# Patient Record
Sex: Male | Born: 1935 | Race: White | Hispanic: No | Smoking: Former smoker
Health system: Southern US, Community
[De-identification: ages and names within clinical notes are randomized; demographics above are authoritative.]

## PROBLEM LIST (undated history)

## (undated) DIAGNOSIS — I4891 Unspecified atrial fibrillation: Secondary | ICD-10-CM

## (undated) DIAGNOSIS — R7302 Impaired glucose tolerance (oral): Secondary | ICD-10-CM

## (undated) DIAGNOSIS — K5730 Diverticulosis of large intestine without perforation or abscess without bleeding: Secondary | ICD-10-CM

## (undated) DIAGNOSIS — L719 Rosacea, unspecified: Secondary | ICD-10-CM

## (undated) DIAGNOSIS — N3281 Overactive bladder: Secondary | ICD-10-CM

## (undated) DIAGNOSIS — N400 Enlarged prostate without lower urinary tract symptoms: Secondary | ICD-10-CM

## (undated) DIAGNOSIS — Z9289 Personal history of other medical treatment: Secondary | ICD-10-CM

## (undated) DIAGNOSIS — L409 Psoriasis, unspecified: Secondary | ICD-10-CM

## (undated) DIAGNOSIS — I10 Essential (primary) hypertension: Secondary | ICD-10-CM

## (undated) DIAGNOSIS — N4340 Spermatocele of epididymis, unspecified: Secondary | ICD-10-CM

## (undated) HISTORY — PX: TONSILLECTOMY: SUR1361

## (undated) HISTORY — PX: HYDROCELE EXCISION / REPAIR: SUR1145

## (undated) HISTORY — PX: CATARACT EXTRACTION W/ INTRAOCULAR LENS  IMPLANT, BILATERAL: SHX1307

## (undated) HISTORY — PX: MELANOMA EXCISION: SHX5266

---

## 1999-01-26 ENCOUNTER — Ambulatory Visit (HOSPITAL_BASED_OUTPATIENT_CLINIC_OR_DEPARTMENT_OTHER): Admission: RE | Admit: 1999-01-26 | Discharge: 1999-01-26 | Payer: Self-pay | Admitting: Urology

## 1999-04-04 ENCOUNTER — Other Ambulatory Visit: Admission: RE | Admit: 1999-04-04 | Discharge: 1999-04-04 | Payer: Self-pay | Admitting: Urology

## 2000-03-07 ENCOUNTER — Encounter: Payer: Self-pay | Admitting: Urology

## 2000-03-11 ENCOUNTER — Ambulatory Visit (HOSPITAL_COMMUNITY): Admission: RE | Admit: 2000-03-11 | Discharge: 2000-03-11 | Payer: Self-pay | Admitting: Urology

## 2011-11-13 DIAGNOSIS — I4891 Unspecified atrial fibrillation: Secondary | ICD-10-CM | POA: Diagnosis not present

## 2011-11-13 DIAGNOSIS — Z7901 Long term (current) use of anticoagulants: Secondary | ICD-10-CM | POA: Diagnosis not present

## 2011-11-22 DIAGNOSIS — Z961 Presence of intraocular lens: Secondary | ICD-10-CM | POA: Diagnosis not present

## 2011-11-22 DIAGNOSIS — H2510 Age-related nuclear cataract, unspecified eye: Secondary | ICD-10-CM | POA: Diagnosis not present

## 2011-11-22 DIAGNOSIS — H0230 Blepharochalasis unspecified eye, unspecified eyelid: Secondary | ICD-10-CM | POA: Diagnosis not present

## 2011-11-22 DIAGNOSIS — H18519 Endothelial corneal dystrophy, unspecified eye: Secondary | ICD-10-CM | POA: Diagnosis not present

## 2011-12-02 ENCOUNTER — Other Ambulatory Visit: Payer: Self-pay | Admitting: Ophthalmology

## 2011-12-02 DIAGNOSIS — D2310 Other benign neoplasm of skin of unspecified eyelid, including canthus: Secondary | ICD-10-CM | POA: Diagnosis not present

## 2011-12-02 DIAGNOSIS — L821 Other seborrheic keratosis: Secondary | ICD-10-CM | POA: Diagnosis not present

## 2011-12-25 DIAGNOSIS — I4891 Unspecified atrial fibrillation: Secondary | ICD-10-CM | POA: Diagnosis not present

## 2011-12-25 DIAGNOSIS — Z7901 Long term (current) use of anticoagulants: Secondary | ICD-10-CM | POA: Diagnosis not present

## 2012-02-05 ENCOUNTER — Other Ambulatory Visit: Payer: Self-pay

## 2012-02-05 ENCOUNTER — Inpatient Hospital Stay (HOSPITAL_COMMUNITY)
Admission: AD | Admit: 2012-02-05 | Discharge: 2012-02-06 | DRG: 556 | Disposition: A | Discharge status: Home / Self Care | Payer: Medicare Other | Source: Ambulatory Visit | Attending: Internal Medicine | Admitting: Internal Medicine

## 2012-02-05 ENCOUNTER — Encounter (HOSPITAL_COMMUNITY): Payer: Self-pay | Admitting: *Deleted

## 2012-02-05 DIAGNOSIS — E861 Hypovolemia: Secondary | ICD-10-CM | POA: Diagnosis present

## 2012-02-05 DIAGNOSIS — E119 Type 2 diabetes mellitus without complications: Secondary | ICD-10-CM | POA: Diagnosis present

## 2012-02-05 DIAGNOSIS — T148XXA Other injury of unspecified body region, initial encounter: Secondary | ICD-10-CM | POA: Diagnosis not present

## 2012-02-05 DIAGNOSIS — Z823 Family history of stroke: Secondary | ICD-10-CM | POA: Diagnosis not present

## 2012-02-05 DIAGNOSIS — R6889 Other general symptoms and signs: Secondary | ICD-10-CM | POA: Diagnosis not present

## 2012-02-05 DIAGNOSIS — R55 Syncope and collapse: Secondary | ICD-10-CM | POA: Diagnosis present

## 2012-02-05 DIAGNOSIS — L408 Other psoriasis: Secondary | ICD-10-CM | POA: Diagnosis present

## 2012-02-05 DIAGNOSIS — D649 Anemia, unspecified: Secondary | ICD-10-CM | POA: Diagnosis present

## 2012-02-05 DIAGNOSIS — N400 Enlarged prostate without lower urinary tract symptoms: Secondary | ICD-10-CM | POA: Diagnosis present

## 2012-02-05 DIAGNOSIS — I959 Hypotension, unspecified: Secondary | ICD-10-CM | POA: Diagnosis present

## 2012-02-05 DIAGNOSIS — I10 Essential (primary) hypertension: Secondary | ICD-10-CM | POA: Diagnosis present

## 2012-02-05 DIAGNOSIS — M7981 Nontraumatic hematoma of soft tissue: Principal | ICD-10-CM | POA: Diagnosis present

## 2012-02-05 DIAGNOSIS — D72829 Elevated white blood cell count, unspecified: Secondary | ICD-10-CM | POA: Diagnosis present

## 2012-02-05 DIAGNOSIS — I4891 Unspecified atrial fibrillation: Secondary | ICD-10-CM | POA: Diagnosis present

## 2012-02-05 DIAGNOSIS — Z79899 Other long term (current) drug therapy: Secondary | ICD-10-CM | POA: Diagnosis not present

## 2012-02-05 DIAGNOSIS — IMO0002 Reserved for concepts with insufficient information to code with codable children: Secondary | ICD-10-CM

## 2012-02-05 DIAGNOSIS — T45515A Adverse effect of anticoagulants, initial encounter: Secondary | ICD-10-CM | POA: Diagnosis present

## 2012-02-05 DIAGNOSIS — Z7901 Long term (current) use of anticoagulants: Secondary | ICD-10-CM

## 2012-02-05 DIAGNOSIS — L409 Psoriasis, unspecified: Secondary | ICD-10-CM | POA: Diagnosis present

## 2012-02-05 HISTORY — DX: Essential (primary) hypertension: I10

## 2012-02-05 HISTORY — DX: Diverticulosis of large intestine without perforation or abscess without bleeding: K57.30

## 2012-02-05 HISTORY — DX: Overactive bladder: N32.81

## 2012-02-05 HISTORY — DX: Impaired glucose tolerance (oral): R73.02

## 2012-02-05 HISTORY — DX: Spermatocele of epididymis, unspecified: N43.40

## 2012-02-05 HISTORY — DX: Psoriasis, unspecified: L40.9

## 2012-02-05 HISTORY — DX: Benign prostatic hyperplasia without lower urinary tract symptoms: N40.0

## 2012-02-05 HISTORY — DX: Unspecified atrial fibrillation: I48.91

## 2012-02-05 HISTORY — DX: Rosacea, unspecified: L71.9

## 2012-02-05 MED ORDER — PNEUMOCOCCAL VAC POLYVALENT 25 MCG/0.5ML IJ INJ: 0.5000 mL | INJECTION | INTRAMUSCULAR | Status: DC

## 2012-02-05 MED ORDER — ALUM & MAG HYDROXIDE-SIMETH 200-200-20 MG/5ML PO SUSP: 30.0000 mL | Freq: Four times a day (QID) | ORAL | Status: DC | PRN

## 2012-02-05 MED ORDER — PANTOPRAZOLE SODIUM 40 MG PO TBEC
40.0000 mg | DELAYED_RELEASE_TABLET | Freq: Every day | ORAL | Status: DC
  Administered 2012-02-06: 40 mg via ORAL
  Filled 2012-02-05 (×3): qty 1

## 2012-02-05 MED ORDER — ACETAMINOPHEN 500 MG PO TABS: 500.0000 mg | ORAL_TABLET | Freq: Four times a day (QID) | ORAL | Status: DC | PRN

## 2012-02-05 MED ORDER — SODIUM CHLORIDE 0.9 % IJ SOLN: 3.0000 mL | Freq: Two times a day (BID) | INTRAMUSCULAR | Status: DC

## 2012-02-05 MED ORDER — HYDROCODONE-ACETAMINOPHEN 5-325 MG PO TABS: 1.0000 | ORAL_TABLET | ORAL | Status: DC | PRN

## 2012-02-05 MED ORDER — SENNOSIDES-DOCUSATE SODIUM 8.6-50 MG PO TABS
1.0000 | ORAL_TABLET | Freq: Every evening | ORAL | Status: DC | PRN
  Filled 2012-02-05: qty 1

## 2012-02-05 MED ORDER — ZOLPIDEM TARTRATE 5 MG PO TABS: 5.0000 mg | ORAL_TABLET | Freq: Every evening | ORAL | Status: DC | PRN

## 2012-02-05 MED ORDER — PHYTONADIONE 5 MG PO TABS
5.0000 mg | ORAL_TABLET | Freq: Once | ORAL | Status: AC
  Administered 2012-02-05: 5 mg via ORAL
  Filled 2012-02-05: qty 1

## 2012-02-05 MED ORDER — POTASSIUM CHLORIDE IN NACL 20-0.9 MEQ/L-% IV SOLN
INTRAVENOUS | Status: DC
  Administered 2012-02-05 – 2012-02-06 (×2): via INTRAVENOUS
  Filled 2012-02-05 (×4): qty 1000

## 2012-02-05 NOTE — H&P (Addendum)
William Ibarra is an 78 y.o. male.   Chief Complaint: near fainting today, extremely low blood pressure HPI:  The patient is an 78 year old Caucasian man with several medical problems who is seen in the office today for routine INR recheck.  His INR level was greater than 4.0 and he described to our pharmacist PhD a large hematoma on the posterior left buttock and left thigh that he noticed today. In addition to his INR and a CBC was checked which showed leukocytosis and anemia.  He also had marked lightheadedness with near syncope when walking.  His initial blood pressure in our office was 60/40. He has not had a recent fall, headache, vision change, shortness of breath, cough, abdominal pain, fever, chills, nausea, vomiting, melena, or dysuria or frequency.  Other than the marked lightheadedness, he is only troubled by some soreness in the posterior left thigh.  He has been on Coumadin for stroke prophylaxis with atrial fibrillation chronically without problems.  Past Medical History  Diagnosis Date  . Hypertension   . Atrial fibrillation   . Hypotension 02/05/2012  . BPH (benign prostatic hypertrophy)   . Diabetes mellitus   . Diverticulosis of colon     from a prior colonoscopy  . Hydrocele   . Psoriasis   . Rosacea   . Spermatocele   . Hydrocele   . Impaired glucose tolerance   . Overactive bladder     Medications Prior to Admission  Medication Dose Route Frequency Provider Last Rate Last Dose  . 0.9 % NaCl with KCl 20 mEq/ L  infusion   Intravenous Continuous Garlan Fillers, MD      . acetaminophen (TYLENOL) tablet 500 mg  500 mg Oral Q6H PRN Garlan Fillers, MD      . alum & mag hydroxide-simeth (MAALOX/MYLANTA) 200-200-20 MG/5ML suspension 30 mL  30 mL Oral Q6H PRN Garlan Fillers, MD      . HYDROcodone-acetaminophen [facility]) 5-325 MG per tablet 1-2 tablet  1-2 tablet Oral Q4H PRN Garlan Fillers, MD      . pantoprazole (PROTONIX) EC tablet 40 mg  40 mg Oral Q0600 Garlan Fillers, MD      . phytonadione (VITAMIN K) tablet 5 mg  5 mg Oral Once Garlan Fillers, MD      . senna-docusate (Senokot-S) tablet 1 tablet  1 tablet Oral QHS PRN Garlan Fillers, MD      . sodium chloride 0.9 % injection 3 mL  3 mL Intravenous Q12H Garlan Fillers, MD      . zolpidem [facility]) tablet 5 mg  5 mg Oral QHS PRN Garlan Fillers, MD      . DISCONTD: pneumococcal 23 valent vaccine (PNU-IMMUNE) injection 0.5 mL  0.5 mL Intramuscular Tomorrow-1000 Garlan Fillers, MD       Medications Prior to Admission  Medication Sig Dispense Refill  . atenolol (TENORMIN) 25 MG tablet Take 25 mg by mouth daily.      . candesartan-hydrochlorothiazide (ATACAND HCT) 16-12.5 MG per tablet Take 1 tablet by mouth daily.      Marland Kitchen warfarin (COUMADIN) 5 MG tablet Take 2.5-5 mg by mouth as directed. Skip 02/06/2012 and 02/07/2012 dose per MD and then resume coumadin regiment of 5 mg by mouth on Monday and Friday; 2.5 mg by mouth on remaining days of week        ADDITIONAL HOME MEDICATIONS: Coumadin 5 mg by mouth daily, atenolol 25 mg by mouth daily, Atacand  HCT 16/12.5 take 1 tab by mouth twice a day, aspirin 81 mg by mouth daily  PHYSICIANS INVOLVED IN CARE: Creola Corn (primary care)   PERTINENT IMMUNIZATION HISTORY:  Pneumovax vaccination in 2007, influenza vaccination on August 21, 2011  Past Surgical History  Procedure Date  . Hydrocele excision / repair   Remote prostate biopsy was negative, left cataract operation, colon polypectomy  History reviewed. No pertinent family history. His father died at age 41 years from stroke.  His mother died at age 38 years from leukemia.  There is no family history of diabetes mellitus or colon cancer  Social History:  reports that he quit smoking about 20 years ago. He has never used smokeless tobacco. He reports that he drinks alcohol. He reports that he does not use illicit drugs.  Allergies: No Known Allergies   ROS: anemia, ankle swelling,  arthritis, cataracts, diabetes, fainting of seizures, heart palpitation, high blood pressure and atrial fibrillation, benign prostatic hypertrophy, diabetes mellitus, type II, diverticulosis, overactive bladder, psoriasis, rosacea  PHYSICAL EXAM: Blood pressure 107/68, pulse 90, temperature 98.1 F (36.7 C), temperature source Oral, resp. rate 18, height 6\' 1"  (1.854 m), weight 113.717 kg (250 lb 11.2 oz), SpO2 97.00%. In general, the patient is a well-nourished well-developed Caucasian man who was in no apparent distress while sitting in a chair.  He did have some diaphoresis.  HEENT exam was within normal limits, neck was supple without jugular venous distention or carotid bruit, chest was clear to auscultation, heart had a slightly irregular rhythm without significant murmur or gallop, abdomen had normal bowel sounds and no hepatosplenomegaly or tenderness, he had left 1+ leg edema and right trace ankle edema.  He had a large hematoma on the posterior left thigh.  Neurologic exam: He was alert and well oriented with a normal affect, cranial nerves II through XII were normal, sensory exam was grossly normal, motor strength was 5 over 5 throughout, cerebellar function and gait were not assessed.  CBC results today in the office: White blood cell 17.8 with 82.8% granulocytes, hemoglobin 9.9, hematocrit 30.2, MCV 95.8, platelets 446  No results found for this or any previous visit (from the past 48 hour(s)). No results found.   Assessment/Plan #1 Near Syncope And Hypotension: His symptoms are most likely from a fairly significant spontaneous bleed into the left lower extremity with a resultant decrease in intravascular fluid volume. His symptoms are less likely from a transient arrhythmia.  We will admit him to a telemetry bed and administer moderate dose IV fluids.  We will hold his blood pressure medications and recheck his orthostatic vital signs tomorrow morning. #2 Anticoagulation: His INR is  supratherapeutic with spontaneous bleeding, so he will be treated with vitamin K by mouth to reverse his Coumadin somewhat.  We will cautiously restart his Coumadin with a target INR of 2.0-3.0. #3 Atrial Fibrillation: Stable on current medications.  Sharnise Blough G 02/05/2012, 6:00 PM

## 2012-02-05 NOTE — ED Notes (Signed)
..  02/05/2012 4:45 PM UPDATED information has been added to the PTA medication list.  Please re-address admission med rec navigator. Thank you,   Selinda Flavin .. Medications Prior to Admission  Medication Dose Route Frequency Provider Last Rate Last Dose  . pneumococcal 23 valent vaccine (PNU-IMMUNE) injection 0.5 mL  0.5 mL Intramuscular Tomorrow-1000 Garlan Fillers, MD       Medications Prior to Admission  Medication Sig Dispense Refill  . atenolol (TENORMIN) 25 MG tablet Take 25 mg by mouth daily.      . candesartan-hydrochlorothiazide (ATACAND HCT) 16-12.5 MG per tablet Take 1 tablet by mouth daily.      Marland Kitchen warfarin (COUMADIN) 5 MG tablet Take 2.5-5 mg by mouth as directed. Skip 02/06/2012 and 02/07/2012 dose per MD and then resume coumadin regiment of 5 mg by mouth on Monday and Friday; 2.5 mg by mouth on remaining days of week

## 2012-02-06 DIAGNOSIS — D649 Anemia, unspecified: Secondary | ICD-10-CM | POA: Diagnosis present

## 2012-02-06 LAB — CBC
HCT: 21.5 % — ABNORMAL LOW (ref 39.0–52.0)
HCT: 27.6 % — ABNORMAL LOW (ref 39.0–52.0)
Hemoglobin: 7 g/dL — ABNORMAL LOW (ref 13.0–17.0)
Hemoglobin: 9.2 g/dL — ABNORMAL LOW (ref 13.0–17.0)
MCH: 30.4 pg (ref 26.0–34.0)
MCH: 30.5 pg (ref 26.0–34.0)
MCHC: 32.6 g/dL (ref 30.0–36.0)
MCHC: 33.3 g/dL (ref 30.0–36.0)
MCV: 93.5 fL (ref 78.0–100.0)
MCV: 91.4 fL (ref 78.0–100.0)
Platelets: 222 10*3/uL (ref 150–400)
Platelets: 219 10*3/uL (ref 150–400)
RBC: 2.3 MIL/uL — ABNORMAL LOW (ref 4.22–5.81)
RBC: 3.02 MIL/uL — ABNORMAL LOW (ref 4.22–5.81)
RDW: 15.4 % (ref 11.5–15.5)
RDW: 16.7 % — ABNORMAL HIGH (ref 11.5–15.5)
WBC: 8.8 10*3/uL (ref 4.0–10.5)
WBC: 11.7 10*3/uL — ABNORMAL HIGH (ref 4.0–10.5)

## 2012-02-06 LAB — COMPREHENSIVE METABOLIC PANEL
ALT: 6 U/L (ref 0–53)
AST: 10 U/L (ref 0–37)
Albumin: 2.8 g/dL — ABNORMAL LOW (ref 3.5–5.2)
Alkaline Phosphatase: 31 U/L — ABNORMAL LOW (ref 39–117)
BUN: 28 mg/dL — ABNORMAL HIGH (ref 6–23)
CO2: 29 mEq/L (ref 19–32)
Calcium: 8.4 mg/dL (ref 8.4–10.5)
Chloride: 105 mEq/L (ref 96–112)
Creatinine, Ser: 1.08 mg/dL (ref 0.50–1.35)
GFR calc Af Amer: 70 mL/min — ABNORMAL LOW (ref >90)
GFR calc non Af Amer: 61 mL/min — ABNORMAL LOW (ref >90)
Glucose, Bld: 138 mg/dL — ABNORMAL HIGH (ref 70–99)
Potassium: 3.2 mEq/L — ABNORMAL LOW (ref 3.5–5.1)
Sodium: 140 mEq/L (ref 135–145)
Total Bilirubin: 1 mg/dL (ref 0.3–1.2)
Total Protein: 4.8 g/dL — ABNORMAL LOW (ref 6.0–8.3)

## 2012-02-06 LAB — ABO/RH: ABO/RH(D): A POS

## 2012-02-06 LAB — PREPARE RBC (CROSSMATCH)

## 2012-02-06 LAB — PROTIME-INR
INR: 2.49 — ABNORMAL HIGH (ref 0.00–1.49)
Prothrombin Time: 27.3 seconds — ABNORMAL HIGH (ref 11.6–15.2)

## 2012-02-06 MED ORDER — FUROSEMIDE 10 MG/ML IJ SOLN
20.0000 mg | Freq: Once | INTRAMUSCULAR | Status: AC
  Administered 2012-02-06: 20 mg via INTRAVENOUS
  Filled 2012-02-06: qty 2

## 2012-02-06 MED ORDER — ACETAMINOPHEN 325 MG PO TABS
650.0000 mg | ORAL_TABLET | Freq: Once | ORAL | Status: AC
  Administered 2012-02-06: 650 mg via ORAL
  Filled 2012-02-06: qty 2

## 2012-02-06 NOTE — Discharge Summary (Addendum)
Physician Discharge Summary  Patient ID: William Ibarra MRN: [id number] DOB/AGE: 76-Jul-1925 76 y.o.  Admit date: 02/05/2012 Discharge date: 02/06/2012   Discharge Diagnoses:  Principal Problem:  *Hypotension Active Problems:  Anticoagulation excessive  Anemia  Diabetes mellitus type 2, controlled  Atrial fibrillation  Psoriasis   Discharged Condition: good  Hospital Course: the patient is an 78 year old Caucasian man with several medical problems who was seen in the office on February 05, 2012 for a routine INR check on Coumadin for atrial fibrillation.  The INR level was greater than 4.0, and he pointed out to the pharmacist PhD a spontaneous large hematoma that formed on the left by doc and posterior left thigh, so she checked a CBC that showed leukocytosis with moderate anemia.  We also checked a blood pressure that was 60/40 associated with lightheadedness.  He denied having a recent fall, headache, vision change, shortness of breath, chest pain, abdominal pain, nausea, vomiting, melena, dysuria, or frequency.  He did complain of some soreness in the posterior left thigh.  He was admitted to the hospital for further evaluation of symptomatic hypotension in the face of a supratherapeutic INR level.  In the hospital he was treated with moderate dose IV fluids, vitamin K 5 mg by mouth, and medications for hypertension were held.  Earlier today his repeat hematocrit showed a very significant drop with hydration, so he was given a transfusion of 2 units of packed red blood cells.  With this his hematocrit increased appropriately to more acceptable range.  He also has had normal blood pressures today and has been able to ambulate without assistance.  He did receive Lasix 20 mg IV following the transfusion and after the transfusion he denied any shortness of breath or chest discomfort or nausea or vomiting.  He has been eating well with normal bowel movements.  Procedures: Transfusion of 2 units of  packed red blood cells.  Complications: None.  The plan will be for him to again hold his Coumadin dose tonight and restart Coumadin at 5 mg twice weekly and 2.5 mg on other days.  He will also have an office visit with [place] early next week at which time we can recheck his INR level.  Of note, his aspirin is being held for now.  Consults: None  Significant Diagnostic Studies:  No results found.  Labs: Lab Results  Component Value Date   WBC 11.7* 02/06/2012   HGB 9.2* 02/06/2012   HCT 27.6* 02/06/2012   MCV 91.4 02/06/2012   PLT 219 02/06/2012  Above represents CBC following transfusion of 2 units of PRBCs   Lab 02/06/12 0508  NA 140  K 3.2*  CL 105  CO2 29  BUN 28*  CREATININE 1.08  CALCIUM 8.4  PROT 4.8*  BILITOT 1.0  ALKPHOS 31*  ALT 6  AST 10  GLUCOSE 138*       Lab Results  Component Value Date   INR 2.49* 02/06/2012     No results found for this or any previous visit (from the past 240 hour(s)).    Discharge Exam: Blood pressure 123/79, pulse 103, temperature 98.3 F (36.8 C), temperature source Oral, resp. rate 18, height 6\' 1"  (1.854 m), weight 113.717 kg (250 lb 11.2 oz), SpO2 98.00%.  Physical Exam: In general, he is a well-nourished well-developed white man who was in no apparent distress pulse in upright in bed.  HEENT exam was within normal limits, neck was supple without jugular venous distention or carotid bruit,  chest was clear to auscultation, heart had a slightly irregular rhythm without significant murmur or gallop, abdomen had normal bowel sounds, he had 2+ left leg edema and 1+ right leg edema.  Neurologic exam: He was alert and well oriented with normal affect and able to move all extremities well.  Skin exam: there is a very large hematoma along the left buttock and posterior aspect of the left thigh.  Disposition:he'll be discharged to home in the company of his daughter and will [facility]  He  should use his cane for now to prevent falls.  He should restart his Coumadin as described below.  He should also schedule a followup visit with Dr. Timothy Lasso at [facility] either Tuesday or Wednesday of next week.  Discharge Orders    Future Orders Please Complete By Expires   Diet - low sodium heart healthy      Increase activity slowly      Discharge instructions      Comments:   He will be discharged to home.  He can use Tylenol as needed for pain.  He should closely follow new directions for Coumadin dosing with no Coumadin on April 4 and April 5.  He should call us if his symptoms worsen such as worsening lightheadedness, shortness of breath, abnormal bleeding, nausea, vomiting, fever, or chills.  He should have a followup office visit and INR recheck either Tuesday or Wednesday of this coming week.  He should call [phone] to schedule that visit.     Medication List  As of 02/06/2012  6:50 PM   STOP taking these medications         aspirin EC 81 MG tablet         TAKE these medications         acetaminophen 500 MG tablet   Commonly known as: TYLENOL   Take 500 mg by mouth every 6 (six) hours as needed.      atenolol 25 MG tablet   Commonly known as: TENORMIN   Take 25 mg by mouth daily.      candesartan-hydrochlorothiazide 16-12.5 MG per tablet   Commonly known as: ATACAND HCT   Take 1 tablet by mouth daily.      warfarin 5 MG tablet   Commonly known as: COUMADIN   Take 2.5-5 mg by mouth as directed. Skip 02/06/2012 and 02/07/2012 dose per MD and then resume coumadin regiment of 5 mg by mouth on Monday and Friday; 2.5 mg by mouth on remaining days of week             Signed: Jaycub Noorani G 02/06/2012, 6:50 PM

## 2012-02-06 NOTE — Progress Notes (Signed)
Hgb 7 this AM. No evidence of further bleeding. BP stable. Left leg hematoma appears unchanged. Dr. Jarold Motto made aware.

## 2012-02-06 NOTE — Progress Notes (Signed)
CSW received call from [facility] indicating this pt is from their [employer]. If pt requires a higher level of care csw is available to offer assistance with d/c planning.  Cori Razor  LCSW  [phone]

## 2012-02-06 NOTE — Progress Notes (Signed)
Subjective: He feels OK and denies shortness of breath, chest pain, abdominal pain, or nausea. He had a BM today which was normal. He would very much like to return home ASAP as his wife needs his assistance. We discussed the risks and benefits of blood transfusion and he agrees to have this done today.  Objective: Vital signs in last 24 hours: Temp:  [98 F (36.7 C)-98.5 F (36.9 C)] 98.5 F (36.9 C) (04/04 0406) Pulse Rate:  [68-99] 93  (04/04 0406) Resp:  [18-19] 18  (04/04 0406) BP: (101-116)/(65-72) 109/65 mmHg (04/04 0406) SpO2:  [96 %-98 %] 98 % (04/04 0406) Weight:  [113.717 kg (250 lb 11.2 oz)-250.7 kg (552 lb 11.1 oz)] 113.717 kg (250 lb 11.2 oz) (04/03 1638) Weight change:    Intake/Output from previous day:     General appearance: alert, cooperative and no distress Resp: clear to auscultation bilaterally Cardio: irregularly irregular rhythm GI: soft, non-tender; bowel sounds normal; no masses,  no organomegaly Extremities: 2+ left leg edema Skin: very large hematoma left buttock and posterior left thigh  Lab Results:  Basename 02/06/12 0508  WBC 8.8  HGB 7.0*  HCT 21.5*  PLT 222   BMET  Basename 02/06/12 0508  NA 140  K 3.2*  CL 105  CO2 29  GLUCOSE 138*  BUN 28*  CREATININE 1.08  CALCIUM 8.4   CMET CMP     Component Value Date/Time   NA 140 02/06/2012 0508   K 3.2* 02/06/2012 0508   CL 105 02/06/2012 0508   CO2 29 02/06/2012 0508   GLUCOSE 138* 02/06/2012 0508   BUN 28* 02/06/2012 0508   CREATININE 1.08 02/06/2012 0508   CALCIUM 8.4 02/06/2012 0508   PROT 4.8* 02/06/2012 0508   ALBUMIN 2.8* 02/06/2012 0508   AST 10 02/06/2012 0508   ALT 6 02/06/2012 0508   ALKPHOS 31* 02/06/2012 0508   BILITOT 1.0 02/06/2012 0508   GFRNONAA 61* 02/06/2012 0508   GFRAA 70* 02/06/2012 0508    CBG (last 3)  No results found for this basename: GLUCAP:3 in the last 72 hours  INR RESULTS:   Lab Results  Component Value Date   INR 2.49* 02/06/2012     Studies/Results: No results  found.  Medications: I have reviewed the patient's current medications.  Assessment/Plan: #1 Thigh Hematoma and Anemia:  Interval large drop in Hemoglobin likely due to normalization of intravascular fluid volume with IVF. He does not appear to have continued bleeding and his INR is now in a therapeutic range. We will transfuse 2 units of PRBC today, and discharge to home either later today or tomorrow morning. #2 Supratherapeutic INR:  Improved with holding coumadin and administration of vitamin K. #3 Hypotension:  Improved and we will have him ambulate on the ward today with standby assistance.  LOS: 1 day   Grethel Zenk G 02/06/2012, 7:59 AM

## 2012-02-07 LAB — TYPE AND SCREEN
ABO/RH(D): A POS
Antibody Screen: NEGATIVE
Unit division: 0
Unit division: 0

## 2012-02-12 DIAGNOSIS — I4891 Unspecified atrial fibrillation: Secondary | ICD-10-CM | POA: Diagnosis not present

## 2012-02-12 DIAGNOSIS — T148XXA Other injury of unspecified body region, initial encounter: Secondary | ICD-10-CM | POA: Diagnosis not present

## 2012-02-12 DIAGNOSIS — Z7901 Long term (current) use of anticoagulants: Secondary | ICD-10-CM | POA: Diagnosis not present

## 2012-02-12 DIAGNOSIS — E119 Type 2 diabetes mellitus without complications: Secondary | ICD-10-CM | POA: Diagnosis not present

## 2012-02-19 DIAGNOSIS — I4891 Unspecified atrial fibrillation: Secondary | ICD-10-CM | POA: Diagnosis not present

## 2012-02-19 DIAGNOSIS — Z7901 Long term (current) use of anticoagulants: Secondary | ICD-10-CM | POA: Diagnosis not present

## 2012-02-27 DIAGNOSIS — I4891 Unspecified atrial fibrillation: Secondary | ICD-10-CM | POA: Diagnosis not present

## 2012-02-27 DIAGNOSIS — Z7901 Long term (current) use of anticoagulants: Secondary | ICD-10-CM | POA: Diagnosis not present

## 2012-03-26 DIAGNOSIS — I4891 Unspecified atrial fibrillation: Secondary | ICD-10-CM | POA: Diagnosis not present

## 2012-03-26 DIAGNOSIS — Z7901 Long term (current) use of anticoagulants: Secondary | ICD-10-CM | POA: Diagnosis not present

## 2012-04-15 DIAGNOSIS — Z7901 Long term (current) use of anticoagulants: Secondary | ICD-10-CM | POA: Diagnosis not present

## 2012-04-15 DIAGNOSIS — I4891 Unspecified atrial fibrillation: Secondary | ICD-10-CM | POA: Diagnosis not present

## 2012-04-29 DIAGNOSIS — Z7901 Long term (current) use of anticoagulants: Secondary | ICD-10-CM | POA: Diagnosis not present

## 2012-04-29 DIAGNOSIS — I4891 Unspecified atrial fibrillation: Secondary | ICD-10-CM | POA: Diagnosis not present

## 2012-05-27 DIAGNOSIS — Z7901 Long term (current) use of anticoagulants: Secondary | ICD-10-CM | POA: Diagnosis not present

## 2012-05-27 DIAGNOSIS — E119 Type 2 diabetes mellitus without complications: Secondary | ICD-10-CM | POA: Diagnosis not present

## 2012-05-27 DIAGNOSIS — I10 Essential (primary) hypertension: Secondary | ICD-10-CM | POA: Diagnosis not present

## 2012-05-27 DIAGNOSIS — I4891 Unspecified atrial fibrillation: Secondary | ICD-10-CM | POA: Diagnosis not present

## 2012-06-09 ENCOUNTER — Other Ambulatory Visit: Payer: Self-pay | Admitting: Dermatology

## 2012-06-09 DIAGNOSIS — L570 Actinic keratosis: Secondary | ICD-10-CM | POA: Diagnosis not present

## 2012-06-09 DIAGNOSIS — C44221 Squamous cell carcinoma of skin of unspecified ear and external auricular canal: Secondary | ICD-10-CM | POA: Diagnosis not present

## 2012-06-09 DIAGNOSIS — C44320 Squamous cell carcinoma of skin of unspecified parts of face: Secondary | ICD-10-CM | POA: Diagnosis not present

## 2012-06-09 DIAGNOSIS — D485 Neoplasm of uncertain behavior of skin: Secondary | ICD-10-CM | POA: Diagnosis not present

## 2012-07-01 DIAGNOSIS — I4891 Unspecified atrial fibrillation: Secondary | ICD-10-CM | POA: Diagnosis not present

## 2012-07-01 DIAGNOSIS — Z7901 Long term (current) use of anticoagulants: Secondary | ICD-10-CM | POA: Diagnosis not present

## 2012-07-15 DIAGNOSIS — C44320 Squamous cell carcinoma of skin of unspecified parts of face: Secondary | ICD-10-CM | POA: Diagnosis not present

## 2012-08-12 DIAGNOSIS — I10 Essential (primary) hypertension: Secondary | ICD-10-CM | POA: Diagnosis not present

## 2012-08-12 DIAGNOSIS — Z125 Encounter for screening for malignant neoplasm of prostate: Secondary | ICD-10-CM | POA: Diagnosis not present

## 2012-08-12 DIAGNOSIS — I4891 Unspecified atrial fibrillation: Secondary | ICD-10-CM | POA: Diagnosis not present

## 2012-08-12 DIAGNOSIS — Z23 Encounter for immunization: Secondary | ICD-10-CM | POA: Diagnosis not present

## 2012-08-12 DIAGNOSIS — R7301 Impaired fasting glucose: Secondary | ICD-10-CM | POA: Diagnosis not present

## 2012-08-12 DIAGNOSIS — Z7901 Long term (current) use of anticoagulants: Secondary | ICD-10-CM | POA: Diagnosis not present

## 2012-08-18 DIAGNOSIS — Z1212 Encounter for screening for malignant neoplasm of rectum: Secondary | ICD-10-CM | POA: Diagnosis not present

## 2012-08-18 DIAGNOSIS — N401 Enlarged prostate with lower urinary tract symptoms: Secondary | ICD-10-CM | POA: Diagnosis not present

## 2012-08-18 DIAGNOSIS — E876 Hypokalemia: Secondary | ICD-10-CM | POA: Diagnosis not present

## 2012-08-18 DIAGNOSIS — I10 Essential (primary) hypertension: Secondary | ICD-10-CM | POA: Diagnosis not present

## 2012-08-18 DIAGNOSIS — Z0000 Encounter for general adult medical examination without abnormal findings: Secondary | ICD-10-CM | POA: Diagnosis not present

## 2012-08-18 DIAGNOSIS — I4891 Unspecified atrial fibrillation: Secondary | ICD-10-CM | POA: Diagnosis not present

## 2012-09-23 DIAGNOSIS — I4891 Unspecified atrial fibrillation: Secondary | ICD-10-CM | POA: Diagnosis not present

## 2012-09-23 DIAGNOSIS — Z7901 Long term (current) use of anticoagulants: Secondary | ICD-10-CM | POA: Diagnosis not present

## 2012-09-23 DIAGNOSIS — E119 Type 2 diabetes mellitus without complications: Secondary | ICD-10-CM | POA: Diagnosis not present

## 2012-11-05 DIAGNOSIS — R634 Abnormal weight loss: Secondary | ICD-10-CM | POA: Diagnosis not present

## 2012-11-05 DIAGNOSIS — Z7901 Long term (current) use of anticoagulants: Secondary | ICD-10-CM | POA: Diagnosis not present

## 2012-11-05 DIAGNOSIS — I4891 Unspecified atrial fibrillation: Secondary | ICD-10-CM | POA: Diagnosis not present

## 2012-11-05 DIAGNOSIS — Z79899 Other long term (current) drug therapy: Secondary | ICD-10-CM | POA: Diagnosis not present

## 2012-11-25 DIAGNOSIS — Z961 Presence of intraocular lens: Secondary | ICD-10-CM | POA: Diagnosis not present

## 2012-11-25 DIAGNOSIS — H2510 Age-related nuclear cataract, unspecified eye: Secondary | ICD-10-CM | POA: Diagnosis not present

## 2012-11-25 DIAGNOSIS — H18519 Endothelial corneal dystrophy, unspecified eye: Secondary | ICD-10-CM | POA: Diagnosis not present

## 2012-11-26 DIAGNOSIS — Z7901 Long term (current) use of anticoagulants: Secondary | ICD-10-CM | POA: Diagnosis not present

## 2012-11-26 DIAGNOSIS — E876 Hypokalemia: Secondary | ICD-10-CM | POA: Diagnosis not present

## 2012-11-26 DIAGNOSIS — I4891 Unspecified atrial fibrillation: Secondary | ICD-10-CM | POA: Diagnosis not present

## 2012-12-31 DIAGNOSIS — Z7901 Long term (current) use of anticoagulants: Secondary | ICD-10-CM | POA: Diagnosis not present

## 2012-12-31 DIAGNOSIS — I4891 Unspecified atrial fibrillation: Secondary | ICD-10-CM | POA: Diagnosis not present

## 2013-01-21 DIAGNOSIS — I4891 Unspecified atrial fibrillation: Secondary | ICD-10-CM | POA: Diagnosis not present

## 2013-01-21 DIAGNOSIS — L570 Actinic keratosis: Secondary | ICD-10-CM | POA: Diagnosis not present

## 2013-01-21 DIAGNOSIS — I10 Essential (primary) hypertension: Secondary | ICD-10-CM | POA: Diagnosis not present

## 2013-01-21 DIAGNOSIS — Z85828 Personal history of other malignant neoplasm of skin: Secondary | ICD-10-CM | POA: Diagnosis not present

## 2013-01-21 DIAGNOSIS — L820 Inflamed seborrheic keratosis: Secondary | ICD-10-CM | POA: Diagnosis not present

## 2013-01-21 DIAGNOSIS — Z7901 Long term (current) use of anticoagulants: Secondary | ICD-10-CM | POA: Diagnosis not present

## 2013-02-02 DIAGNOSIS — I4891 Unspecified atrial fibrillation: Secondary | ICD-10-CM | POA: Diagnosis not present

## 2013-02-02 DIAGNOSIS — E119 Type 2 diabetes mellitus without complications: Secondary | ICD-10-CM | POA: Diagnosis not present

## 2013-02-02 DIAGNOSIS — Z7901 Long term (current) use of anticoagulants: Secondary | ICD-10-CM | POA: Diagnosis not present

## 2013-02-02 DIAGNOSIS — I10 Essential (primary) hypertension: Secondary | ICD-10-CM | POA: Diagnosis not present

## 2013-03-03 DIAGNOSIS — Z7901 Long term (current) use of anticoagulants: Secondary | ICD-10-CM | POA: Diagnosis not present

## 2013-03-03 DIAGNOSIS — I4891 Unspecified atrial fibrillation: Secondary | ICD-10-CM | POA: Diagnosis not present

## 2013-04-06 DIAGNOSIS — Z7901 Long term (current) use of anticoagulants: Secondary | ICD-10-CM | POA: Diagnosis not present

## 2013-04-06 DIAGNOSIS — I4891 Unspecified atrial fibrillation: Secondary | ICD-10-CM | POA: Diagnosis not present

## 2013-05-12 DIAGNOSIS — I4891 Unspecified atrial fibrillation: Secondary | ICD-10-CM | POA: Diagnosis not present

## 2013-05-12 DIAGNOSIS — Z7901 Long term (current) use of anticoagulants: Secondary | ICD-10-CM | POA: Diagnosis not present

## 2013-06-23 DIAGNOSIS — Z7901 Long term (current) use of anticoagulants: Secondary | ICD-10-CM | POA: Diagnosis not present

## 2013-06-23 DIAGNOSIS — I4891 Unspecified atrial fibrillation: Secondary | ICD-10-CM | POA: Diagnosis not present

## 2013-07-09 DIAGNOSIS — Z23 Encounter for immunization: Secondary | ICD-10-CM | POA: Diagnosis not present

## 2013-07-28 ENCOUNTER — Other Ambulatory Visit: Payer: Self-pay | Admitting: Dermatology

## 2013-07-28 DIAGNOSIS — C44621 Squamous cell carcinoma of skin of unspecified upper limb, including shoulder: Secondary | ICD-10-CM | POA: Diagnosis not present

## 2013-07-28 DIAGNOSIS — L570 Actinic keratosis: Secondary | ICD-10-CM | POA: Diagnosis not present

## 2013-07-28 DIAGNOSIS — C44320 Squamous cell carcinoma of skin of unspecified parts of face: Secondary | ICD-10-CM | POA: Diagnosis not present

## 2013-07-28 DIAGNOSIS — D485 Neoplasm of uncertain behavior of skin: Secondary | ICD-10-CM | POA: Diagnosis not present

## 2013-07-28 DIAGNOSIS — L820 Inflamed seborrheic keratosis: Secondary | ICD-10-CM | POA: Diagnosis not present

## 2013-07-28 DIAGNOSIS — L408 Other psoriasis: Secondary | ICD-10-CM | POA: Diagnosis not present

## 2013-07-28 DIAGNOSIS — L821 Other seborrheic keratosis: Secondary | ICD-10-CM | POA: Diagnosis not present

## 2013-07-28 DIAGNOSIS — Z85828 Personal history of other malignant neoplasm of skin: Secondary | ICD-10-CM | POA: Diagnosis not present

## 2013-08-04 DIAGNOSIS — I4891 Unspecified atrial fibrillation: Secondary | ICD-10-CM | POA: Diagnosis not present

## 2013-08-04 DIAGNOSIS — Z7901 Long term (current) use of anticoagulants: Secondary | ICD-10-CM | POA: Diagnosis not present

## 2013-08-18 DIAGNOSIS — I10 Essential (primary) hypertension: Secondary | ICD-10-CM | POA: Diagnosis not present

## 2013-08-18 DIAGNOSIS — Z125 Encounter for screening for malignant neoplasm of prostate: Secondary | ICD-10-CM | POA: Diagnosis not present

## 2013-08-18 DIAGNOSIS — E119 Type 2 diabetes mellitus without complications: Secondary | ICD-10-CM | POA: Diagnosis not present

## 2013-08-24 DIAGNOSIS — L408 Other psoriasis: Secondary | ICD-10-CM | POA: Diagnosis not present

## 2013-08-24 DIAGNOSIS — Z1331 Encounter for screening for depression: Secondary | ICD-10-CM | POA: Diagnosis not present

## 2013-08-24 DIAGNOSIS — R05 Cough: Secondary | ICD-10-CM | POA: Diagnosis not present

## 2013-08-24 DIAGNOSIS — R269 Unspecified abnormalities of gait and mobility: Secondary | ICD-10-CM | POA: Diagnosis not present

## 2013-08-24 DIAGNOSIS — I4891 Unspecified atrial fibrillation: Secondary | ICD-10-CM | POA: Diagnosis not present

## 2013-08-24 DIAGNOSIS — Z7901 Long term (current) use of anticoagulants: Secondary | ICD-10-CM | POA: Diagnosis not present

## 2013-08-24 DIAGNOSIS — R059 Cough, unspecified: Secondary | ICD-10-CM | POA: Diagnosis not present

## 2013-08-24 DIAGNOSIS — E119 Type 2 diabetes mellitus without complications: Secondary | ICD-10-CM | POA: Diagnosis not present

## 2013-08-24 DIAGNOSIS — I10 Essential (primary) hypertension: Secondary | ICD-10-CM | POA: Diagnosis not present

## 2013-08-24 DIAGNOSIS — Z0000 Encounter for general adult medical examination without abnormal findings: Secondary | ICD-10-CM | POA: Diagnosis not present

## 2013-09-16 DIAGNOSIS — I4891 Unspecified atrial fibrillation: Secondary | ICD-10-CM | POA: Diagnosis not present

## 2013-09-16 DIAGNOSIS — Z7901 Long term (current) use of anticoagulants: Secondary | ICD-10-CM | POA: Diagnosis not present

## 2013-10-07 DIAGNOSIS — Z7901 Long term (current) use of anticoagulants: Secondary | ICD-10-CM | POA: Diagnosis not present

## 2013-10-07 DIAGNOSIS — I4891 Unspecified atrial fibrillation: Secondary | ICD-10-CM | POA: Diagnosis not present

## 2013-10-07 DIAGNOSIS — Z1212 Encounter for screening for malignant neoplasm of rectum: Secondary | ICD-10-CM | POA: Diagnosis not present

## 2013-11-10 DIAGNOSIS — I4891 Unspecified atrial fibrillation: Secondary | ICD-10-CM | POA: Diagnosis not present

## 2013-11-10 DIAGNOSIS — Z7901 Long term (current) use of anticoagulants: Secondary | ICD-10-CM | POA: Diagnosis not present

## 2013-12-01 DIAGNOSIS — H18519 Endothelial corneal dystrophy, unspecified eye: Secondary | ICD-10-CM | POA: Diagnosis not present

## 2013-12-01 DIAGNOSIS — Z961 Presence of intraocular lens: Secondary | ICD-10-CM | POA: Diagnosis not present

## 2013-12-01 DIAGNOSIS — H02839 Dermatochalasis of unspecified eye, unspecified eyelid: Secondary | ICD-10-CM | POA: Diagnosis not present

## 2013-12-01 DIAGNOSIS — H01029 Squamous blepharitis unspecified eye, unspecified eyelid: Secondary | ICD-10-CM | POA: Diagnosis not present

## 2013-12-01 DIAGNOSIS — H43819 Vitreous degeneration, unspecified eye: Secondary | ICD-10-CM | POA: Diagnosis not present

## 2013-12-01 DIAGNOSIS — H2510 Age-related nuclear cataract, unspecified eye: Secondary | ICD-10-CM | POA: Diagnosis not present

## 2013-12-14 DIAGNOSIS — Z7901 Long term (current) use of anticoagulants: Secondary | ICD-10-CM | POA: Diagnosis not present

## 2013-12-14 DIAGNOSIS — I4891 Unspecified atrial fibrillation: Secondary | ICD-10-CM | POA: Diagnosis not present

## 2013-12-20 DIAGNOSIS — H52209 Unspecified astigmatism, unspecified eye: Secondary | ICD-10-CM | POA: Diagnosis not present

## 2013-12-20 DIAGNOSIS — H2510 Age-related nuclear cataract, unspecified eye: Secondary | ICD-10-CM | POA: Diagnosis not present

## 2013-12-21 DIAGNOSIS — Z961 Presence of intraocular lens: Secondary | ICD-10-CM | POA: Diagnosis not present

## 2013-12-21 DIAGNOSIS — H4300 Vitreous prolapse, unspecified eye: Secondary | ICD-10-CM | POA: Diagnosis not present

## 2013-12-21 DIAGNOSIS — T8529XA Other mechanical complication of intraocular lens, initial encounter: Secondary | ICD-10-CM | POA: Diagnosis not present

## 2013-12-22 DIAGNOSIS — Y839 Surgical procedure, unspecified as the cause of abnormal reaction of the patient, or of later complication, without mention of misadventure at the time of the procedure: Secondary | ICD-10-CM | POA: Diagnosis not present

## 2013-12-22 DIAGNOSIS — T8529XA Other mechanical complication of intraocular lens, initial encounter: Secondary | ICD-10-CM | POA: Diagnosis not present

## 2013-12-22 DIAGNOSIS — H2700 Aphakia, unspecified eye: Secondary | ICD-10-CM | POA: Diagnosis not present

## 2013-12-22 DIAGNOSIS — Z961 Presence of intraocular lens: Secondary | ICD-10-CM | POA: Diagnosis not present

## 2013-12-22 DIAGNOSIS — H4300 Vitreous prolapse, unspecified eye: Secondary | ICD-10-CM | POA: Diagnosis not present

## 2013-12-22 DIAGNOSIS — Z9849 Cataract extraction status, unspecified eye: Secondary | ICD-10-CM | POA: Diagnosis not present

## 2013-12-22 DIAGNOSIS — H1820 Unspecified corneal edema: Secondary | ICD-10-CM | POA: Diagnosis not present

## 2013-12-22 DIAGNOSIS — H5460 Unqualified visual loss, one eye, unspecified: Secondary | ICD-10-CM | POA: Diagnosis not present

## 2014-01-13 DIAGNOSIS — I4891 Unspecified atrial fibrillation: Secondary | ICD-10-CM | POA: Diagnosis not present

## 2014-01-13 DIAGNOSIS — Z7901 Long term (current) use of anticoagulants: Secondary | ICD-10-CM | POA: Diagnosis not present

## 2014-01-26 ENCOUNTER — Other Ambulatory Visit: Payer: Self-pay | Admitting: Dermatology

## 2014-01-26 DIAGNOSIS — C44519 Basal cell carcinoma of skin of other part of trunk: Secondary | ICD-10-CM | POA: Diagnosis not present

## 2014-01-26 DIAGNOSIS — C44611 Basal cell carcinoma of skin of unspecified upper limb, including shoulder: Secondary | ICD-10-CM | POA: Diagnosis not present

## 2014-01-26 DIAGNOSIS — Z85828 Personal history of other malignant neoplasm of skin: Secondary | ICD-10-CM | POA: Diagnosis not present

## 2014-01-26 DIAGNOSIS — D239 Other benign neoplasm of skin, unspecified: Secondary | ICD-10-CM | POA: Diagnosis not present

## 2014-01-26 DIAGNOSIS — D485 Neoplasm of uncertain behavior of skin: Secondary | ICD-10-CM | POA: Diagnosis not present

## 2014-01-26 DIAGNOSIS — L821 Other seborrheic keratosis: Secondary | ICD-10-CM | POA: Diagnosis not present

## 2014-01-26 DIAGNOSIS — L570 Actinic keratosis: Secondary | ICD-10-CM | POA: Diagnosis not present

## 2014-01-31 DIAGNOSIS — H43819 Vitreous degeneration, unspecified eye: Secondary | ICD-10-CM | POA: Diagnosis not present

## 2014-02-09 DIAGNOSIS — H35359 Cystoid macular degeneration, unspecified eye: Secondary | ICD-10-CM | POA: Diagnosis not present

## 2014-02-09 DIAGNOSIS — Z961 Presence of intraocular lens: Secondary | ICD-10-CM | POA: Diagnosis not present

## 2014-02-16 DIAGNOSIS — Z7901 Long term (current) use of anticoagulants: Secondary | ICD-10-CM | POA: Diagnosis not present

## 2014-02-16 DIAGNOSIS — I4891 Unspecified atrial fibrillation: Secondary | ICD-10-CM | POA: Diagnosis not present

## 2014-02-24 DIAGNOSIS — H269 Unspecified cataract: Secondary | ICD-10-CM | POA: Diagnosis not present

## 2014-02-24 DIAGNOSIS — Z7901 Long term (current) use of anticoagulants: Secondary | ICD-10-CM | POA: Diagnosis not present

## 2014-02-24 DIAGNOSIS — I10 Essential (primary) hypertension: Secondary | ICD-10-CM | POA: Diagnosis not present

## 2014-02-24 DIAGNOSIS — I4891 Unspecified atrial fibrillation: Secondary | ICD-10-CM | POA: Diagnosis not present

## 2014-02-24 DIAGNOSIS — Z1331 Encounter for screening for depression: Secondary | ICD-10-CM | POA: Diagnosis not present

## 2014-02-24 DIAGNOSIS — L408 Other psoriasis: Secondary | ICD-10-CM | POA: Diagnosis not present

## 2014-02-24 DIAGNOSIS — R269 Unspecified abnormalities of gait and mobility: Secondary | ICD-10-CM | POA: Diagnosis not present

## 2014-02-24 DIAGNOSIS — E119 Type 2 diabetes mellitus without complications: Secondary | ICD-10-CM | POA: Diagnosis not present

## 2014-02-28 DIAGNOSIS — M6281 Muscle weakness (generalized): Secondary | ICD-10-CM | POA: Diagnosis not present

## 2014-02-28 DIAGNOSIS — R279 Unspecified lack of coordination: Secondary | ICD-10-CM | POA: Diagnosis not present

## 2014-02-28 DIAGNOSIS — R269 Unspecified abnormalities of gait and mobility: Secondary | ICD-10-CM | POA: Diagnosis not present

## 2014-03-03 DIAGNOSIS — R279 Unspecified lack of coordination: Secondary | ICD-10-CM | POA: Diagnosis not present

## 2014-03-03 DIAGNOSIS — M6281 Muscle weakness (generalized): Secondary | ICD-10-CM | POA: Diagnosis not present

## 2014-03-03 DIAGNOSIS — R269 Unspecified abnormalities of gait and mobility: Secondary | ICD-10-CM | POA: Diagnosis not present

## 2014-03-08 DIAGNOSIS — H11139 Conjunctival pigmentations, unspecified eye: Secondary | ICD-10-CM | POA: Diagnosis not present

## 2014-03-09 DIAGNOSIS — R269 Unspecified abnormalities of gait and mobility: Secondary | ICD-10-CM | POA: Diagnosis not present

## 2014-03-09 DIAGNOSIS — M6281 Muscle weakness (generalized): Secondary | ICD-10-CM | POA: Diagnosis not present

## 2014-03-11 DIAGNOSIS — M6281 Muscle weakness (generalized): Secondary | ICD-10-CM | POA: Diagnosis not present

## 2014-03-11 DIAGNOSIS — R269 Unspecified abnormalities of gait and mobility: Secondary | ICD-10-CM | POA: Diagnosis not present

## 2014-03-14 DIAGNOSIS — R269 Unspecified abnormalities of gait and mobility: Secondary | ICD-10-CM | POA: Diagnosis not present

## 2014-03-14 DIAGNOSIS — M6281 Muscle weakness (generalized): Secondary | ICD-10-CM | POA: Diagnosis not present

## 2014-03-16 DIAGNOSIS — R269 Unspecified abnormalities of gait and mobility: Secondary | ICD-10-CM | POA: Diagnosis not present

## 2014-03-16 DIAGNOSIS — M6281 Muscle weakness (generalized): Secondary | ICD-10-CM | POA: Diagnosis not present

## 2014-03-21 DIAGNOSIS — H119 Unspecified disorder of conjunctiva: Secondary | ICD-10-CM | POA: Diagnosis not present

## 2014-03-21 DIAGNOSIS — C6900 Malignant neoplasm of unspecified conjunctiva: Secondary | ICD-10-CM | POA: Diagnosis not present

## 2014-03-22 DIAGNOSIS — I10 Essential (primary) hypertension: Secondary | ICD-10-CM | POA: Diagnosis not present

## 2014-03-22 DIAGNOSIS — M79609 Pain in unspecified limb: Secondary | ICD-10-CM | POA: Diagnosis not present

## 2014-03-22 DIAGNOSIS — M6281 Muscle weakness (generalized): Secondary | ICD-10-CM | POA: Diagnosis not present

## 2014-03-22 DIAGNOSIS — I4891 Unspecified atrial fibrillation: Secondary | ICD-10-CM | POA: Diagnosis not present

## 2014-03-22 DIAGNOSIS — R269 Unspecified abnormalities of gait and mobility: Secondary | ICD-10-CM | POA: Diagnosis not present

## 2014-03-22 DIAGNOSIS — Z6831 Body mass index (BMI) 31.0-31.9, adult: Secondary | ICD-10-CM | POA: Diagnosis not present

## 2014-03-23 ENCOUNTER — Other Ambulatory Visit (HOSPITAL_COMMUNITY): Payer: Self-pay | Admitting: Family Medicine

## 2014-03-23 ENCOUNTER — Ambulatory Visit (HOSPITAL_COMMUNITY)
Admission: RE | Admit: 2014-03-23 | Discharge: 2014-03-23 | Disposition: A | Discharge status: Home / Self Care | Payer: Medicare Other | Source: Ambulatory Visit | Attending: Vascular Surgery | Admitting: Vascular Surgery

## 2014-03-23 DIAGNOSIS — M79609 Pain in unspecified limb: Secondary | ICD-10-CM

## 2014-03-24 DIAGNOSIS — M6281 Muscle weakness (generalized): Secondary | ICD-10-CM | POA: Diagnosis not present

## 2014-03-24 DIAGNOSIS — Z23 Encounter for immunization: Secondary | ICD-10-CM | POA: Diagnosis not present

## 2014-03-24 DIAGNOSIS — Z7901 Long term (current) use of anticoagulants: Secondary | ICD-10-CM | POA: Diagnosis not present

## 2014-03-24 DIAGNOSIS — R269 Unspecified abnormalities of gait and mobility: Secondary | ICD-10-CM | POA: Diagnosis not present

## 2014-03-24 DIAGNOSIS — I4891 Unspecified atrial fibrillation: Secondary | ICD-10-CM | POA: Diagnosis not present

## 2014-04-05 DIAGNOSIS — Z0389 Encounter for observation for other suspected diseases and conditions ruled out: Secondary | ICD-10-CM | POA: Diagnosis not present

## 2014-04-05 DIAGNOSIS — C6900 Malignant neoplasm of unspecified conjunctiva: Secondary | ICD-10-CM | POA: Diagnosis not present

## 2014-04-06 DIAGNOSIS — R279 Unspecified lack of coordination: Secondary | ICD-10-CM | POA: Diagnosis not present

## 2014-04-06 DIAGNOSIS — M6281 Muscle weakness (generalized): Secondary | ICD-10-CM | POA: Diagnosis not present

## 2014-04-06 DIAGNOSIS — R269 Unspecified abnormalities of gait and mobility: Secondary | ICD-10-CM | POA: Diagnosis not present

## 2014-04-08 DIAGNOSIS — R269 Unspecified abnormalities of gait and mobility: Secondary | ICD-10-CM | POA: Diagnosis not present

## 2014-04-08 DIAGNOSIS — R279 Unspecified lack of coordination: Secondary | ICD-10-CM | POA: Diagnosis not present

## 2014-04-08 DIAGNOSIS — M6281 Muscle weakness (generalized): Secondary | ICD-10-CM | POA: Diagnosis not present

## 2014-04-11 DIAGNOSIS — R269 Unspecified abnormalities of gait and mobility: Secondary | ICD-10-CM | POA: Diagnosis not present

## 2014-04-11 DIAGNOSIS — R279 Unspecified lack of coordination: Secondary | ICD-10-CM | POA: Diagnosis not present

## 2014-04-11 DIAGNOSIS — M6281 Muscle weakness (generalized): Secondary | ICD-10-CM | POA: Diagnosis not present

## 2014-04-15 DIAGNOSIS — M6281 Muscle weakness (generalized): Secondary | ICD-10-CM | POA: Diagnosis not present

## 2014-04-15 DIAGNOSIS — R269 Unspecified abnormalities of gait and mobility: Secondary | ICD-10-CM | POA: Diagnosis not present

## 2014-04-15 DIAGNOSIS — R279 Unspecified lack of coordination: Secondary | ICD-10-CM | POA: Diagnosis not present

## 2014-04-18 DIAGNOSIS — M6281 Muscle weakness (generalized): Secondary | ICD-10-CM | POA: Diagnosis not present

## 2014-04-18 DIAGNOSIS — R269 Unspecified abnormalities of gait and mobility: Secondary | ICD-10-CM | POA: Diagnosis not present

## 2014-04-18 DIAGNOSIS — R279 Unspecified lack of coordination: Secondary | ICD-10-CM | POA: Diagnosis not present

## 2014-04-21 DIAGNOSIS — M6281 Muscle weakness (generalized): Secondary | ICD-10-CM | POA: Diagnosis not present

## 2014-04-21 DIAGNOSIS — R279 Unspecified lack of coordination: Secondary | ICD-10-CM | POA: Diagnosis not present

## 2014-04-21 DIAGNOSIS — R269 Unspecified abnormalities of gait and mobility: Secondary | ICD-10-CM | POA: Diagnosis not present

## 2014-04-25 DIAGNOSIS — R269 Unspecified abnormalities of gait and mobility: Secondary | ICD-10-CM | POA: Diagnosis not present

## 2014-04-25 DIAGNOSIS — R279 Unspecified lack of coordination: Secondary | ICD-10-CM | POA: Diagnosis not present

## 2014-04-25 DIAGNOSIS — M6281 Muscle weakness (generalized): Secondary | ICD-10-CM | POA: Diagnosis not present

## 2014-04-28 DIAGNOSIS — R269 Unspecified abnormalities of gait and mobility: Secondary | ICD-10-CM | POA: Diagnosis not present

## 2014-04-28 DIAGNOSIS — M6281 Muscle weakness (generalized): Secondary | ICD-10-CM | POA: Diagnosis not present

## 2014-04-28 DIAGNOSIS — Z7901 Long term (current) use of anticoagulants: Secondary | ICD-10-CM | POA: Diagnosis not present

## 2014-04-28 DIAGNOSIS — I4891 Unspecified atrial fibrillation: Secondary | ICD-10-CM | POA: Diagnosis not present

## 2014-04-28 DIAGNOSIS — R279 Unspecified lack of coordination: Secondary | ICD-10-CM | POA: Diagnosis not present

## 2014-05-02 DIAGNOSIS — M6281 Muscle weakness (generalized): Secondary | ICD-10-CM | POA: Diagnosis not present

## 2014-05-02 DIAGNOSIS — R269 Unspecified abnormalities of gait and mobility: Secondary | ICD-10-CM | POA: Diagnosis not present

## 2014-05-02 DIAGNOSIS — R279 Unspecified lack of coordination: Secondary | ICD-10-CM | POA: Diagnosis not present

## 2014-05-05 DIAGNOSIS — M6281 Muscle weakness (generalized): Secondary | ICD-10-CM | POA: Diagnosis not present

## 2014-05-05 DIAGNOSIS — R269 Unspecified abnormalities of gait and mobility: Secondary | ICD-10-CM | POA: Diagnosis not present

## 2014-05-05 DIAGNOSIS — R279 Unspecified lack of coordination: Secondary | ICD-10-CM | POA: Diagnosis not present

## 2014-05-09 DIAGNOSIS — R279 Unspecified lack of coordination: Secondary | ICD-10-CM | POA: Diagnosis not present

## 2014-05-09 DIAGNOSIS — R269 Unspecified abnormalities of gait and mobility: Secondary | ICD-10-CM | POA: Diagnosis not present

## 2014-05-09 DIAGNOSIS — M6281 Muscle weakness (generalized): Secondary | ICD-10-CM | POA: Diagnosis not present

## 2014-05-10 DIAGNOSIS — I4891 Unspecified atrial fibrillation: Secondary | ICD-10-CM | POA: Diagnosis not present

## 2014-05-10 DIAGNOSIS — H119 Unspecified disorder of conjunctiva: Secondary | ICD-10-CM | POA: Diagnosis not present

## 2014-05-11 DIAGNOSIS — M6281 Muscle weakness (generalized): Secondary | ICD-10-CM | POA: Diagnosis not present

## 2014-05-11 DIAGNOSIS — R269 Unspecified abnormalities of gait and mobility: Secondary | ICD-10-CM | POA: Diagnosis not present

## 2014-05-11 DIAGNOSIS — R279 Unspecified lack of coordination: Secondary | ICD-10-CM | POA: Diagnosis not present

## 2014-05-18 DIAGNOSIS — H119 Unspecified disorder of conjunctiva: Secondary | ICD-10-CM | POA: Diagnosis not present

## 2014-05-18 DIAGNOSIS — C6900 Malignant neoplasm of unspecified conjunctiva: Secondary | ICD-10-CM | POA: Diagnosis not present

## 2014-05-19 DIAGNOSIS — C6900 Malignant neoplasm of unspecified conjunctiva: Secondary | ICD-10-CM | POA: Diagnosis not present

## 2014-05-24 DIAGNOSIS — Z48810 Encounter for surgical aftercare following surgery on the sense organs: Secondary | ICD-10-CM | POA: Diagnosis not present

## 2014-05-24 DIAGNOSIS — H1820 Unspecified corneal edema: Secondary | ICD-10-CM | POA: Diagnosis not present

## 2014-05-24 DIAGNOSIS — F17200 Nicotine dependence, unspecified, uncomplicated: Secondary | ICD-10-CM | POA: Diagnosis not present

## 2014-05-24 DIAGNOSIS — Z9889 Other specified postprocedural states: Secondary | ICD-10-CM | POA: Diagnosis not present

## 2014-05-26 DIAGNOSIS — R279 Unspecified lack of coordination: Secondary | ICD-10-CM | POA: Diagnosis not present

## 2014-05-26 DIAGNOSIS — M6281 Muscle weakness (generalized): Secondary | ICD-10-CM | POA: Diagnosis not present

## 2014-05-26 DIAGNOSIS — R269 Unspecified abnormalities of gait and mobility: Secondary | ICD-10-CM | POA: Diagnosis not present

## 2014-06-02 DIAGNOSIS — Z9889 Other specified postprocedural states: Secondary | ICD-10-CM | POA: Diagnosis not present

## 2014-06-02 DIAGNOSIS — F17200 Nicotine dependence, unspecified, uncomplicated: Secondary | ICD-10-CM | POA: Diagnosis not present

## 2014-06-02 DIAGNOSIS — Z48810 Encounter for surgical aftercare following surgery on the sense organs: Secondary | ICD-10-CM | POA: Diagnosis not present

## 2014-06-07 DIAGNOSIS — Z6831 Body mass index (BMI) 31.0-31.9, adult: Secondary | ICD-10-CM | POA: Diagnosis not present

## 2014-06-07 DIAGNOSIS — I4891 Unspecified atrial fibrillation: Secondary | ICD-10-CM | POA: Diagnosis not present

## 2014-06-07 DIAGNOSIS — M216X9 Other acquired deformities of unspecified foot: Secondary | ICD-10-CM | POA: Diagnosis not present

## 2014-06-07 DIAGNOSIS — I10 Essential (primary) hypertension: Secondary | ICD-10-CM | POA: Diagnosis not present

## 2014-06-07 DIAGNOSIS — R269 Unspecified abnormalities of gait and mobility: Secondary | ICD-10-CM | POA: Diagnosis not present

## 2014-06-09 DIAGNOSIS — I4891 Unspecified atrial fibrillation: Secondary | ICD-10-CM | POA: Diagnosis not present

## 2014-06-09 DIAGNOSIS — Z7901 Long term (current) use of anticoagulants: Secondary | ICD-10-CM | POA: Diagnosis not present

## 2014-06-20 DIAGNOSIS — F17200 Nicotine dependence, unspecified, uncomplicated: Secondary | ICD-10-CM | POA: Diagnosis not present

## 2014-06-20 DIAGNOSIS — Z9889 Other specified postprocedural states: Secondary | ICD-10-CM | POA: Diagnosis not present

## 2014-06-20 DIAGNOSIS — I10 Essential (primary) hypertension: Secondary | ICD-10-CM | POA: Diagnosis not present

## 2014-06-29 DIAGNOSIS — M6281 Muscle weakness (generalized): Secondary | ICD-10-CM | POA: Diagnosis not present

## 2014-06-29 DIAGNOSIS — R269 Unspecified abnormalities of gait and mobility: Secondary | ICD-10-CM | POA: Diagnosis not present

## 2014-07-27 ENCOUNTER — Other Ambulatory Visit: Payer: Self-pay | Admitting: Dermatology

## 2014-07-27 DIAGNOSIS — L570 Actinic keratosis: Secondary | ICD-10-CM | POA: Diagnosis not present

## 2014-07-27 DIAGNOSIS — C44320 Squamous cell carcinoma of skin of unspecified parts of face: Secondary | ICD-10-CM | POA: Diagnosis not present

## 2014-07-27 DIAGNOSIS — L408 Other psoriasis: Secondary | ICD-10-CM | POA: Diagnosis not present

## 2014-07-27 DIAGNOSIS — L821 Other seborrheic keratosis: Secondary | ICD-10-CM | POA: Diagnosis not present

## 2014-07-27 DIAGNOSIS — D485 Neoplasm of uncertain behavior of skin: Secondary | ICD-10-CM | POA: Diagnosis not present

## 2014-07-27 DIAGNOSIS — Z85828 Personal history of other malignant neoplasm of skin: Secondary | ICD-10-CM | POA: Diagnosis not present

## 2014-07-27 DIAGNOSIS — Z85820 Personal history of malignant melanoma of skin: Secondary | ICD-10-CM | POA: Diagnosis not present

## 2014-07-28 DIAGNOSIS — Z7901 Long term (current) use of anticoagulants: Secondary | ICD-10-CM | POA: Diagnosis not present

## 2014-07-28 DIAGNOSIS — I4891 Unspecified atrial fibrillation: Secondary | ICD-10-CM | POA: Diagnosis not present

## 2014-08-16 DIAGNOSIS — Z85840 Personal history of malignant neoplasm of eye: Secondary | ICD-10-CM | POA: Diagnosis not present

## 2014-08-16 DIAGNOSIS — Z7901 Long term (current) use of anticoagulants: Secondary | ICD-10-CM | POA: Diagnosis not present

## 2014-08-16 DIAGNOSIS — Z483 Aftercare following surgery for neoplasm: Secondary | ICD-10-CM | POA: Diagnosis not present

## 2014-08-24 ENCOUNTER — Other Ambulatory Visit: Payer: Self-pay | Admitting: Dermatology

## 2014-08-24 DIAGNOSIS — C44329 Squamous cell carcinoma of skin of other parts of face: Secondary | ICD-10-CM | POA: Diagnosis not present

## 2014-08-24 DIAGNOSIS — Z85820 Personal history of malignant melanoma of skin: Secondary | ICD-10-CM | POA: Diagnosis not present

## 2014-08-24 DIAGNOSIS — Z85828 Personal history of other malignant neoplasm of skin: Secondary | ICD-10-CM | POA: Diagnosis not present

## 2014-08-25 DIAGNOSIS — I10 Essential (primary) hypertension: Secondary | ICD-10-CM | POA: Diagnosis not present

## 2014-08-25 DIAGNOSIS — Z7901 Long term (current) use of anticoagulants: Secondary | ICD-10-CM | POA: Diagnosis not present

## 2014-08-25 DIAGNOSIS — R7302 Impaired glucose tolerance (oral): Secondary | ICD-10-CM | POA: Diagnosis not present

## 2014-08-25 DIAGNOSIS — Z125 Encounter for screening for malignant neoplasm of prostate: Secondary | ICD-10-CM | POA: Diagnosis not present

## 2014-08-25 DIAGNOSIS — Z23 Encounter for immunization: Secondary | ICD-10-CM | POA: Diagnosis not present

## 2014-08-25 DIAGNOSIS — E119 Type 2 diabetes mellitus without complications: Secondary | ICD-10-CM | POA: Diagnosis not present

## 2014-08-31 DIAGNOSIS — Z85820 Personal history of malignant melanoma of skin: Secondary | ICD-10-CM | POA: Diagnosis not present

## 2014-08-31 DIAGNOSIS — Z85828 Personal history of other malignant neoplasm of skin: Secondary | ICD-10-CM | POA: Diagnosis not present

## 2014-08-31 DIAGNOSIS — C44329 Squamous cell carcinoma of skin of other parts of face: Secondary | ICD-10-CM | POA: Diagnosis not present

## 2014-09-01 DIAGNOSIS — D473 Essential (hemorrhagic) thrombocythemia: Secondary | ICD-10-CM | POA: Diagnosis not present

## 2014-09-01 DIAGNOSIS — I10 Essential (primary) hypertension: Secondary | ICD-10-CM | POA: Diagnosis not present

## 2014-09-01 DIAGNOSIS — D72829 Elevated white blood cell count, unspecified: Secondary | ICD-10-CM | POA: Diagnosis not present

## 2014-09-01 DIAGNOSIS — C439 Malignant melanoma of skin, unspecified: Secondary | ICD-10-CM | POA: Diagnosis not present

## 2014-09-01 DIAGNOSIS — Z008 Encounter for other general examination: Secondary | ICD-10-CM | POA: Diagnosis not present

## 2014-09-01 DIAGNOSIS — M21371 Foot drop, right foot: Secondary | ICD-10-CM | POA: Diagnosis not present

## 2014-09-01 DIAGNOSIS — R634 Abnormal weight loss: Secondary | ICD-10-CM | POA: Diagnosis not present

## 2014-09-01 DIAGNOSIS — I480 Paroxysmal atrial fibrillation: Secondary | ICD-10-CM | POA: Diagnosis not present

## 2014-09-01 DIAGNOSIS — R2689 Other abnormalities of gait and mobility: Secondary | ICD-10-CM | POA: Diagnosis not present

## 2014-09-05 DIAGNOSIS — Z1212 Encounter for screening for malignant neoplasm of rectum: Secondary | ICD-10-CM | POA: Diagnosis not present

## 2014-09-08 DIAGNOSIS — Z7901 Long term (current) use of anticoagulants: Secondary | ICD-10-CM | POA: Diagnosis not present

## 2014-09-08 DIAGNOSIS — I480 Paroxysmal atrial fibrillation: Secondary | ICD-10-CM | POA: Diagnosis not present

## 2014-09-14 DIAGNOSIS — H1851 Endothelial corneal dystrophy: Secondary | ICD-10-CM | POA: Diagnosis not present

## 2014-09-14 DIAGNOSIS — H40051 Ocular hypertension, right eye: Secondary | ICD-10-CM | POA: Diagnosis not present

## 2014-09-14 DIAGNOSIS — H02834 Dermatochalasis of left upper eyelid: Secondary | ICD-10-CM | POA: Diagnosis not present

## 2014-09-14 DIAGNOSIS — H02831 Dermatochalasis of right upper eyelid: Secondary | ICD-10-CM | POA: Diagnosis not present

## 2014-09-14 DIAGNOSIS — Z961 Presence of intraocular lens: Secondary | ICD-10-CM | POA: Diagnosis not present

## 2014-09-14 DIAGNOSIS — C6901 Malignant neoplasm of right conjunctiva: Secondary | ICD-10-CM | POA: Diagnosis not present

## 2014-10-05 DIAGNOSIS — H3530 Unspecified macular degeneration: Secondary | ICD-10-CM | POA: Diagnosis not present

## 2014-10-05 DIAGNOSIS — H1820 Unspecified corneal edema: Secondary | ICD-10-CM | POA: Diagnosis not present

## 2014-10-20 DIAGNOSIS — C6901 Malignant neoplasm of right conjunctiva: Secondary | ICD-10-CM | POA: Diagnosis not present

## 2014-10-20 DIAGNOSIS — I480 Paroxysmal atrial fibrillation: Secondary | ICD-10-CM | POA: Diagnosis not present

## 2014-10-20 DIAGNOSIS — H1820 Unspecified corneal edema: Secondary | ICD-10-CM | POA: Diagnosis not present

## 2014-10-20 DIAGNOSIS — H3531 Nonexudative age-related macular degeneration: Secondary | ICD-10-CM | POA: Diagnosis not present

## 2014-10-20 DIAGNOSIS — Z7901 Long term (current) use of anticoagulants: Secondary | ICD-10-CM | POA: Diagnosis not present

## 2014-10-20 DIAGNOSIS — H35351 Cystoid macular degeneration, right eye: Secondary | ICD-10-CM | POA: Diagnosis not present

## 2014-10-20 DIAGNOSIS — T8522XD Displacement of intraocular lens, subsequent encounter: Secondary | ICD-10-CM | POA: Diagnosis not present

## 2014-10-24 ENCOUNTER — Inpatient Hospital Stay (HOSPITAL_COMMUNITY): Payer: Medicare Other

## 2014-10-24 ENCOUNTER — Encounter (HOSPITAL_COMMUNITY): Payer: Self-pay | Admitting: General Practice

## 2014-10-24 ENCOUNTER — Inpatient Hospital Stay (HOSPITAL_COMMUNITY)
Admission: AD | Admit: 2014-10-24 | Discharge: 2014-10-27 | DRG: 605 | Disposition: A | Discharge status: Nursing Home (Includes ICF) | Payer: Medicare Other | Source: Ambulatory Visit | Attending: Internal Medicine | Admitting: Internal Medicine

## 2014-10-24 DIAGNOSIS — R509 Fever, unspecified: Secondary | ICD-10-CM

## 2014-10-24 DIAGNOSIS — Z85820 Personal history of malignant melanoma of skin: Secondary | ICD-10-CM

## 2014-10-24 DIAGNOSIS — Z79899 Other long term (current) drug therapy: Secondary | ICD-10-CM | POA: Diagnosis not present

## 2014-10-24 DIAGNOSIS — M79651 Pain in right thigh: Secondary | ICD-10-CM | POA: Diagnosis not present

## 2014-10-24 DIAGNOSIS — L409 Psoriasis, unspecified: Secondary | ICD-10-CM | POA: Diagnosis present

## 2014-10-24 DIAGNOSIS — R627 Adult failure to thrive: Secondary | ICD-10-CM | POA: Diagnosis present

## 2014-10-24 DIAGNOSIS — I959 Hypotension, unspecified: Secondary | ICD-10-CM | POA: Diagnosis present

## 2014-10-24 DIAGNOSIS — E119 Type 2 diabetes mellitus without complications: Secondary | ICD-10-CM | POA: Diagnosis present

## 2014-10-24 DIAGNOSIS — M7981 Nontraumatic hematoma of soft tissue: Secondary | ICD-10-CM | POA: Diagnosis not present

## 2014-10-24 DIAGNOSIS — Z66 Do not resuscitate: Secondary | ICD-10-CM | POA: Diagnosis present

## 2014-10-24 DIAGNOSIS — I480 Paroxysmal atrial fibrillation: Secondary | ICD-10-CM | POA: Diagnosis not present

## 2014-10-24 DIAGNOSIS — Z87891 Personal history of nicotine dependence: Secondary | ICD-10-CM | POA: Diagnosis not present

## 2014-10-24 DIAGNOSIS — I10 Essential (primary) hypertension: Secondary | ICD-10-CM | POA: Diagnosis present

## 2014-10-24 DIAGNOSIS — IMO0002 Reserved for concepts with insufficient information to code with codable children: Secondary | ICD-10-CM

## 2014-10-24 DIAGNOSIS — T148XXA Other injury of unspecified body region, initial encounter: Secondary | ICD-10-CM

## 2014-10-24 DIAGNOSIS — I4891 Unspecified atrial fibrillation: Secondary | ICD-10-CM

## 2014-10-24 DIAGNOSIS — N400 Enlarged prostate without lower urinary tract symptoms: Secondary | ICD-10-CM | POA: Diagnosis present

## 2014-10-24 DIAGNOSIS — D62 Acute posthemorrhagic anemia: Secondary | ICD-10-CM | POA: Diagnosis present

## 2014-10-24 DIAGNOSIS — T45515A Adverse effect of anticoagulants, initial encounter: Secondary | ICD-10-CM | POA: Diagnosis present

## 2014-10-24 DIAGNOSIS — E876 Hypokalemia: Secondary | ICD-10-CM | POA: Diagnosis present

## 2014-10-24 DIAGNOSIS — R601 Generalized edema: Secondary | ICD-10-CM | POA: Diagnosis not present

## 2014-10-24 DIAGNOSIS — S7011XA Contusion of right thigh, initial encounter: Secondary | ICD-10-CM | POA: Diagnosis present

## 2014-10-24 DIAGNOSIS — R2689 Other abnormalities of gait and mobility: Secondary | ICD-10-CM | POA: Diagnosis not present

## 2014-10-24 DIAGNOSIS — Z7901 Long term (current) use of anticoagulants: Secondary | ICD-10-CM | POA: Diagnosis not present

## 2014-10-24 HISTORY — DX: Personal history of other medical treatment: Z92.89

## 2014-10-24 LAB — CBC
HCT: 23.8 % — ABNORMAL LOW (ref 39.0–52.0)
Hemoglobin: 7.5 g/dL — ABNORMAL LOW (ref 13.0–17.0)
MCH: 27.3 pg (ref 26.0–34.0)
MCHC: 31.5 g/dL (ref 30.0–36.0)
MCV: 86.5 fL (ref 78.0–100.0)
Platelets: 1025 10*3/uL (ref 150–400)
RBC: 2.75 MIL/uL — ABNORMAL LOW (ref 4.22–5.81)
RDW: 18.7 % — ABNORMAL HIGH (ref 11.5–15.5)
WBC: 24.2 10*3/uL — ABNORMAL HIGH (ref 4.0–10.5)

## 2014-10-24 LAB — COMPREHENSIVE METABOLIC PANEL
ALT: 12 U/L (ref 0–53)
AST: 19 U/L (ref 0–37)
Albumin: 3.2 g/dL — ABNORMAL LOW (ref 3.5–5.2)
Alkaline Phosphatase: 46 U/L (ref 39–117)
Anion gap: 16 — ABNORMAL HIGH (ref 5–15)
BUN: 57 mg/dL — ABNORMAL HIGH (ref 6–23)
CO2: 26 mEq/L (ref 19–32)
Calcium: 8.8 mg/dL (ref 8.4–10.5)
Chloride: 99 mEq/L (ref 96–112)
Creatinine, Ser: 1.77 mg/dL — ABNORMAL HIGH (ref 0.50–1.35)
GFR calc Af Amer: 38 mL/min — ABNORMAL LOW (ref >90)
GFR calc non Af Amer: 33 mL/min — ABNORMAL LOW (ref >90)
Glucose, Bld: 173 mg/dL — ABNORMAL HIGH (ref 70–99)
Potassium: 3.6 mEq/L — ABNORMAL LOW (ref 3.7–5.3)
Sodium: 141 mEq/L (ref 137–147)
Total Bilirubin: 1.6 mg/dL — ABNORMAL HIGH (ref 0.3–1.2)
Total Protein: 6.1 g/dL (ref 6.0–8.3)

## 2014-10-24 LAB — PREPARE RBC (CROSSMATCH)

## 2014-10-24 LAB — GLUCOSE, CAPILLARY: Glucose-Capillary: 134 mg/dL — ABNORMAL HIGH (ref 70–99)

## 2014-10-24 LAB — PROTIME-INR
INR: 4.46 — ABNORMAL HIGH (ref 0.00–1.49)
Prothrombin Time: 42.7 seconds — ABNORMAL HIGH (ref 11.6–15.2)

## 2014-10-24 LAB — PRO B NATRIURETIC PEPTIDE: Pro B Natriuretic peptide (BNP): 2144 pg/mL — ABNORMAL HIGH (ref 0–450)

## 2014-10-24 LAB — ABO/RH: ABO/RH(D): A POS

## 2014-10-24 MED ORDER — ACETAMINOPHEN 325 MG PO TABS
650.0000 mg | ORAL_TABLET | Freq: Four times a day (QID) | ORAL | Status: DC | PRN
  Administered 2014-10-26: 650 mg via ORAL
  Filled 2014-10-24: qty 2

## 2014-10-24 MED ORDER — SODIUM CHLORIDE 0.9 % IV SOLN
INTRAVENOUS | Status: DC
  Administered 2014-10-25: 21:00:00 via INTRAVENOUS

## 2014-10-24 MED ORDER — SODIUM CHLORIDE 0.9 % IV BOLUS (SEPSIS)
500.0000 mL | Freq: Once | INTRAVENOUS | Status: AC
  Administered 2014-10-24: 500 mL via INTRAVENOUS

## 2014-10-24 MED ORDER — VITAMIN K1 10 MG/ML IJ SOLN
5.0000 mg | Freq: Once | INTRAMUSCULAR | Status: AC
  Administered 2014-10-24: 5 mg via SUBCUTANEOUS
  Filled 2014-10-24: qty 0.5

## 2014-10-24 MED ORDER — ONDANSETRON HCL 4 MG/2ML IJ SOLN: 4.0000 mg | Freq: Four times a day (QID) | INTRAMUSCULAR | Status: DC | PRN

## 2014-10-24 MED ORDER — ACETAMINOPHEN 650 MG RE SUPP: 650.0000 mg | Freq: Four times a day (QID) | RECTAL | Status: DC | PRN

## 2014-10-24 MED ORDER — SODIUM CHLORIDE 0.9 % IV SOLN
Freq: Once | INTRAVENOUS | Status: AC
  Administered 2014-10-24: 18:00:00 via INTRAVENOUS

## 2014-10-24 MED ORDER — ONDANSETRON HCL 4 MG PO TABS: 4.0000 mg | ORAL_TABLET | Freq: Four times a day (QID) | ORAL | Status: DC | PRN

## 2014-10-24 NOTE — H&P (Addendum)
Height: 73 in., ( 185.42 cm) Pulse rate: 72 Pulse rhythm: regular  Blood Pressure #1: 80 / 50 mm Hg      Vitals entered by: Dorrene German CMA on October 24, 2014 11:56 AM           Risk Factors:   Smoked Tobacco Use:  Former smoker       Years Since Last Quit:  25 Smokeless Tobacco Use:  Never Drug use:  no HIV high-risk behavior:  no Caffeine use:  2 drinks per day Alcohol use:  yes    Type:  wine    Drinks per day:  1    Has patient --       Felt need to cut down:  no       Been annoyed by complaints:  no       Felt guilty about drinking:  no       Needed eye opener in the morning:  no    Counseled to quit/cut down alcohol use:  no Exercise:  yes    Times per week:  2    Type of Exercise:  walks Seatbelt use:  100 % Sun Exposure:  occasionally  Previous Tobacco Use: Signed On - 09/01/2014 Smoked Tobacco Use:  Former smoker       Year quit:  1990       Years Since Last Quit:  25 years, 11 months, 20 days Smokeless Tobacco Use:  Never    Counseled to quit/cut down:  No Drug use:  no HIV high-risk behavior:  no Caffeine use:  2 drinks per day  Previous Alcohol Use: Signed On - 09/01/2014 Alcohol use:  yes    Type:  wine    Drinks per day:  1    Has patient --       Felt need to cut down:  no       Been annoyed by complaints:  no       Felt guilty about drinking:  no       Needed eye opener in the morning:  no    Counseled to quit/cut down alcohol use:  no Exercise:  yes    Times per week:  2    Type of Exercise:  walks Seatbelt use:  100 % Sun Exposure:  occasionally  Colonoscopy History:    Date of Last Colonoscopy:  02/24/2014  History of Present Illness  Reason for visit: See chief complaint Chief Complaint: R hip/leg is not working, its swollen xsaturday only getting worse. ive taken IBU im not sure its doing any good.  History of Present Illness: 80 Male here for w/in Sick visit.  R hip/leg is not working, its started to swell over the  wekend and has been getting worse. Tight. No SOB but DOE + Pains in legswollen xsaturday only getting worse. Has taken IBU im not sure its doing any good.  I pulled his pants down and he has a massive Hematoma. Has not fallen. No [place]. Last INR was thin at 4.3 12/17   Review of Systems  General:       Denies fevers, chills, headache, sweats, anorexia, fatigue, malaise, weight loss.   Eyes:       Denies blurring, diplopia, irritation, discharge, vision loss, eye pain, photophobia.   Ears/Nose/Throat:       Complains of decreased hearing.        Denies earache, ear discharge, tinnitus, nasal congestion, nosebleeds, sore throat, hoarseness, dysphagia.   Cardiovascular:  Denies chest pains, claudication, palpitations, syncope, dyspnea on exertion, orthopnea, PND, peripheral edema.   Respiratory:       Denies cough, dyspnea, excessive sputum, hemoptysis, wheezing.   Gastrointestinal:       Denies nausea, vomiting, diarrhea, constipation, heartburn, change in bowel habits, abdominal pain, melena, hematochezia, jaundice.   Genitourinary:       Complains of urinary frequency, urinary hesitancy.        Denies dysuria, hematuria, discharge, nocturia, incontinence, genital sores, erectile dysfunction, decreased libido.   Musculoskeletal:       Complains of stiffness, arthritis.        Denies back pain, joint pain, joint swelling, muscle cramps, muscle weakness.   Skin:       Denies rash, itching, dryness, suspicious lesions.   Neurologic:       Complains of gait instability.        Denies radicular symptoms, weakness, paresthesias, seizures, syncope, tremors, vertigo, dizziness.        in Wheelchair Psychiatric:       Denies depression, anxiety, memory loss, mental disturbance, suicidal ideation, hallucinations, paranoia.   Endocrine:       Denies cold intolerance, heat intolerance, polydipsia, polyphagia, polyuria, weight change.   Heme/Lymphatic:       Complains of abnormal  bruising, bleeding.        Denies enlarged lymph nodes.   Allergic/Immunologic:       Denies urticaria, hay fever, persistent infections, HIV exposure.    Past History Past Medical History (reviewed - no changes required): HTN,   IGT/DM2,   Psoriasis,  BPH/Hydrocele/Spermatocele/Overactive Bladder,  Diverticulosis,  Rosacea, Asymptomatic Bradycardia,  New Onset Afib 04/2010-- ECHO showed  Mod-Severe LVH.  Mild Diastolic Dysfunction.  Nml Valves.  Some septal Hypertrophy without obstruction. 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 Lives at [facility] Occupation: Retired No Tobacco. Alcohol: some  Family History Summary:     Reviewed history Last on 09/01/2014 and no changes required:10/24/2014 Father Ailene Ravel.) - Has Family History of Stroke/CVA - Entered On: 08/24/2013 Father Ailene Ravel.) - Has Family History of Heart Disease - Entered On: 06/07/2014  General Comments - FH: Father: deceased 65; CVA, psoriasis (also in uncle) Mother: died 9: leukopenia Psoriasis (-) DM  Social History:    Reviewed history from 02/24/2014 and no changes required:       Married (Wife: Janie-also patient died 2013/12/10) with 3 children and 2 grandchildren.       Lives at [facility]       Occupation: Retired       No Tobacco. Alcohol: some   Physical Exam  General appearance: elderly-frail, NAD, wheelchair  Eyes  External: conjunctivae and lids normal  except slight erythema corners Pupils: equal, round, reactive to light and accommodation  Ears, Nose and Throat  External ears: normal, no lesions or deformities External nose: normal, no lesions or deformities Otoscopic: canals clear, tympanic membranes intact, no fluid Hearing: grossly intact Nasal: mucosa, septum, and turbinates normal Dental:  good dentition Pharynx: tongue normal, protrudes mid line,  posterior pharynx without erythema or exudate  Neck  Neck: supple, no masses, trachea midline Thyroid: no nodules, masses, tenderness, or enlargement  Respiratory  Respiratory effort: no intercostal retractions or use of accessory muscles Auscultation: no rales, rhonchi, or wheezes.   Cardiovascular  Palpation: no thrill or palpable murmurs, no displacement of PMI Auscultation: slightly irregular rhythm, rate controlled Carotid arteries: pulses 2+, symmetric, no bruits Abdominal aorta: no enlargement or bruits Pedal pulses: pulses 2+, symmetric Periph. circulation: R Thigh massive hematoma and bruising and swollen  Gastrointestinal  Abdomen: soft, non-tender, no masses, bowel sounds normal Liver and spleen: no enlargement or nodularity  Lymphatic  Neck: no cervical adenopathy  Musculoskeletal  Gait and station: In Wheelchair RLE: Tight.  No compartment Syndrome  Skin  Inspection: no rashes, lesions. Bruising.   Neurologic  Cranial nerves: II - XII grossly intact Reflexes: 2+, symmetric Sensation: intact to touch, vibration     Complete Medication List: 1)  Hyzaar 100-25 Mg Oral Tabs (Losartan potassium-hctz) .... Take one tablet daily 2)  Atenolol 25 Mg Tabs (Atenolol) .Marland Kitchen.. 1 tab daily 3)  Coumadin 5 Mg Tabs (Warfarin sodium) .... Take 1/2 tablet daily except 1 tablet on mondays   Comments: Admit  ALBEN JEPSEN is an 80 y.o. male.   PCP:   Precious Reel, MD      Past Medical History:  Past Medical History  Diagnosis Date  . Hypertension   .  Atrial fibrillation   . Hypotension 02/05/2012  . BPH (benign prostatic hypertrophy)   . Diabetes mellitus   . Diverticulosis of colon     from a prior colonoscopy  . Hydrocele   . Psoriasis   . Rosacea   . Spermatocele   . Hydrocele   . Impaired glucose tolerance   . Overactive bladder     Past Surgical History  Procedure Laterality Date  . Hydrocele excision / repair        Allergies:  No Known Allergies   Medications: Prior to Admission medications   Medication Sig Start Date End Date Taking? Authorizing Provider  acetaminophen (TYLENOL) 500 MG tablet Take 500 mg by mouth every 6 (six) hours as needed.    Historical Provider, MD  atenolol (TENORMIN) 25 MG tablet Take 25 mg by mouth daily.    Historical Provider, MD  candesartan-hydrochlorothiazide (ATACAND HCT) 16-12.5 MG per tablet Take 1 tablet by mouth daily.    Historical Provider, MD  warfarin (COUMADIN) 5 MG tablet Take 2.5-5 mg by mouth as directed. Skip 02/06/2012 and 02/07/2012 dose per MD and then resume coumadin regiment of 5 mg by mouth on Monday and Friday; 2.5 mg by mouth on remaining days of week    Historical Provider, MD     Medications Prior to Admission  Medication Sig Dispense Refill  . acetaminophen (TYLENOL) 500 MG tablet Take 500 mg by mouth every 6 (six) hours as needed.    Marland Kitchen atenolol (TENORMIN) 25 MG tablet Take 25 mg by mouth daily.    . candesartan-hydrochlorothiazide (ATACAND HCT) 16-12.5 MG per tablet Take 1 tablet by mouth daily.    Marland Kitchen warfarin (COUMADIN) 5 MG tablet Take 2.5-5 mg by mouth as directed. Skip 02/06/2012 and 02/07/2012 dose per MD and then resume coumadin regiment of 5 mg by mouth on Monday and Friday; 2.5 mg by mouth on remaining days of week       Social History:  reports that he quit smoking about  22 years ago. He has never used smokeless tobacco. He reports that he drinks alcohol. He reports that he does not use illicit drugs.  Family History: No family history on file.  Labs  on Admission:  No results for input(s): NA, K, CL, CO2, GLUCOSE, BUN, CREATININE, CALCIUM, MG, PHOS in the last 72 hours. No results for input(s): AST, ALT, ALKPHOS, BILITOT, PROT, ALBUMIN in the last 72 hours. No results for input(s): LIPASE, AMYLASE in the last 72 hours. No results for input(s): WBC, NEUTROABS, HGB, HCT, MCV, PLT in the last 72 hours. No results for input(s): CKTOTAL, CKMB, CKMBINDEX, TROPONINI in the last 72 hours. Lab Results  Component Value Date   INR 2.49* 02/06/2012     LAB RESULT POCT:  Results for orders placed or performed during the hospital encounter of 02/05/12  Comprehensive metabolic panel  Result Value Ref Range   Sodium 140 135 - 145 mEq/L   Potassium 3.2 (L) 3.5 - 5.1 mEq/L   Chloride 105 96 - 112 mEq/L   CO2 29 19 - 32 mEq/L   Glucose, Bld 138 (H) 70 - 99 mg/dL   BUN 28 (H) 6 - 23 mg/dL   Creatinine, Ser 1.08 0.50 - 1.35 mg/dL   Calcium 8.4 8.4 - 10.5 mg/dL   Total Protein 4.8 (L) 6.0 - 8.3 g/dL   Albumin 2.8 (L) 3.5 - 5.2 g/dL   AST 10 0 - 37 U/L   ALT 6 0 - 53 U/L   Alkaline Phosphatase 31 (L) 39 - 117 U/L   Total Bilirubin 1.0 0.3 - 1.2 mg/dL   GFR calc non Af Amer 61 (L) >90 mL/min   GFR calc Af Amer 70 (L) >90 mL/min  CBC  Result Value Ref Range   WBC 8.8 4.0 - 10.5 K/uL   RBC 2.30 (L) 4.22 - 5.81 MIL/uL   Hemoglobin 7.0 (L) 13.0 - 17.0 g/dL   HCT 21.5 (L) 39.0 - 52.0 %   MCV 93.5 78.0 - 100.0 fL   MCH 30.4 26.0 - 34.0 pg   MCHC 32.6 30.0 - 36.0 g/dL   RDW 15.4 11.5 - 15.5 %   Platelets 222 150 - 400 K/uL  Protime-INR  Result Value Ref Range   Prothrombin Time 27.3 (H) 11.6 - 15.2 seconds   INR 2.49 (H) 0.00 - 1.49  CBC  Result Value Ref Range   WBC 11.7 (H) 4.0 - 10.5 K/uL   RBC 3.02 (L) 4.22 - 5.81 MIL/uL   Hemoglobin 9.2 (L) 13.0 - 17.0 g/dL   HCT 27.6 (L) 39.0 - 52.0 %   MCV 91.4 78.0 - 100.0 fL   MCH 30.5 26.0 - 34.0 pg   MCHC 33.3 30.0 - 36.0 g/dL   RDW 16.7 (H) 11.5 - 15.5 %   Platelets 219 150 - 400 K/uL   Prepare RBC  Result Value Ref Range   Order Confirmation NO CURRENT SAMPLE, MUST ORDER TYPE AND SCREEN   Type and screen  Result Value Ref Range   ABO/RH(D) A POS    Antibody Screen NEG    Sample Expiration 02/09/2012    Unit Number [id number]    Blood Component Type RED CELLS,LR    Unit division 00    Status of Unit ISSUED,FINAL    Transfusion Status OK TO TRANSFUSE    Crossmatch Result Compatible    Unit Number [id number]    Blood Component Type RED CELLS,LR    Unit division 00    Status of  Unit ISSUED,FINAL    Transfusion Status OK TO TRANSFUSE    Crossmatch Result Compatible   Prepare RBC  Result Value Ref Range   Order Confirmation ORDER PROCESSED BY BLOOD BANK   ABO/Rh  Result Value Ref Range   ABO/RH(D) A POS       Radiological Exams on Admission: No results found.    Orders placed or performed during the hospital encounter of 02/05/12  . EKG test  . EKG test  . EKG     Assessment/Plan Active Problems:   Anticoagulation excessive   Atrial fibrillation   Hematoma   Hypotension  Problem # 1:  Gait imbalance (ICD-781.2) (ICD10-R26.89)  Cane and brace usually. Wheelchair today c AFTT   Problem # 2:  COUMADIN THERAPY (ICD-V58.61) (ICD10-Z79.01) INR 7.6 c spontanouse sig Hematoma and bleed - Will admit. Administer Vit K and FFP. Monitor Hbg and may need transfusion Elevate leg Watch for Sxs of Shock DNR. [place] for [employer] c low threshold for Step down  Problem # 3:  ATRIAL FIBRILLATION (ICD-427.31) (ICD10-I48.0)  Rate controlled.   Tolerating Rate control only.  last ECHO showed  Mod-Severe LVH.  Mild Diastolic Dysfunction.  Nml Valves.  Some septal Hypertrophy without obstruction.   Continue Coumadin and trying for goal 2-2.5.   He still does not notice any cardiac issues. Stay off ASA.  BP is excellent.   B/P: 80/50  BP low and may be d/t bleed? 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

## 2014-10-25 LAB — CBC
HCT: 23.9 % — ABNORMAL LOW (ref 39.0–52.0)
HCT: 27.3 % — ABNORMAL LOW (ref 39.0–52.0)
Hemoglobin: 7.8 g/dL — ABNORMAL LOW (ref 13.0–17.0)
Hemoglobin: 8.8 g/dL — ABNORMAL LOW (ref 13.0–17.0)
MCH: 28.9 pg (ref 26.0–34.0)
MCH: 27.8 pg (ref 26.0–34.0)
MCHC: 32.6 g/dL (ref 30.0–36.0)
MCHC: 32.2 g/dL (ref 30.0–36.0)
MCV: 88.5 fL (ref 78.0–100.0)
MCV: 86.4 fL (ref 78.0–100.0)
Platelets: 710 10*3/uL — ABNORMAL HIGH (ref 150–400)
Platelets: 708 10*3/uL — ABNORMAL HIGH (ref 150–400)
RBC: 2.7 MIL/uL — ABNORMAL LOW (ref 4.22–5.81)
RBC: 3.16 MIL/uL — ABNORMAL LOW (ref 4.22–5.81)
RDW: 17.6 % — ABNORMAL HIGH (ref 11.5–15.5)
RDW: 16.9 % — ABNORMAL HIGH (ref 11.5–15.5)
WBC: 14.6 10*3/uL — ABNORMAL HIGH (ref 4.0–10.5)
WBC: 15.4 10*3/uL — ABNORMAL HIGH (ref 4.0–10.5)

## 2014-10-25 LAB — BASIC METABOLIC PANEL
Anion gap: 9 (ref 5–15)
BUN: 55 mg/dL — ABNORMAL HIGH (ref 6–23)
CO2: 28 mmol/L (ref 19–32)
Calcium: 8.1 mg/dL — ABNORMAL LOW (ref 8.4–10.5)
Chloride: 102 mEq/L (ref 96–112)
Creatinine, Ser: 1.63 mg/dL — ABNORMAL HIGH (ref 0.50–1.35)
GFR calc Af Amer: 42 mL/min — ABNORMAL LOW (ref >90)
GFR calc non Af Amer: 36 mL/min — ABNORMAL LOW (ref >90)
Glucose, Bld: 182 mg/dL — ABNORMAL HIGH (ref 70–99)
Potassium: 3.4 mmol/L — ABNORMAL LOW (ref 3.5–5.1)
Sodium: 139 mmol/L (ref 135–145)

## 2014-10-25 LAB — GLUCOSE, CAPILLARY
Glucose-Capillary: 146 mg/dL — ABNORMAL HIGH (ref 70–99)
Glucose-Capillary: 147 mg/dL — ABNORMAL HIGH (ref 70–99)

## 2014-10-25 LAB — BRAIN NATRIURETIC PEPTIDE: B Natriuretic Peptide: 224 pg/mL — ABNORMAL HIGH (ref 0.0–100.0)

## 2014-10-25 LAB — PREPARE RBC (CROSSMATCH)

## 2014-10-25 LAB — PATHOLOGIST SMEAR REVIEW

## 2014-10-25 LAB — PROTIME-INR
INR: 2.81 — ABNORMAL HIGH (ref 0.00–1.49)
Prothrombin Time: 29.8 seconds — ABNORMAL HIGH (ref 11.6–15.2)

## 2014-10-25 LAB — PREPARE FRESH FROZEN PLASMA: Unit division: 0

## 2014-10-25 MED ORDER — PHYTONADIONE 5 MG PO TABS
5.0000 mg | ORAL_TABLET | Freq: Once | ORAL | Status: AC
  Administered 2014-10-25: 5 mg via ORAL
  Filled 2014-10-25: qty 1

## 2014-10-25 MED ORDER — SODIUM CHLORIDE 0.9 % IV SOLN
Freq: Once | INTRAVENOUS | Status: AC
  Administered 2014-10-25: 12:00:00 via INTRAVENOUS

## 2014-10-25 NOTE — Progress Notes (Signed)
Subjective: Admitted 12/21 from my office with Massive R thigh Hematoma, Supratherapeutic INR, Sxatic Blood loss Anemia, hypotension, Leg pain and AFTT Given Vit K and FFP to reverse coumadin. BP meds held. Fluid resuscitated 2U PRBCs given  He has remained mentally fine.  Foley placed overnight and he is frustrated by this  He looks better than yesterday in my office.   Objective: Vital signs in last 24 hours: Temp:  [97.9 F (36.6 C)-100 F (37.8 C)] 99.4 F (37.4 C) (12/22 0523) Pulse Rate:  [63-126] 92 (12/22 0523) Resp:  [16-18] 18 (12/22 0523) BP: (84-124)/(30-74) 116/58 mmHg (12/22 0523) SpO2:  [90 %-100 %] 99 % (12/22 0523) Weight:  [107.956 kg (238 lb)-110.133 kg (242 lb 12.8 oz)] 110.133 kg (242 lb 12.8 oz) (12/22 0511) Weight change:  Last BM Date: 10/24/14  CBG (last 3)   Recent Labs  10/24/14 1734  GLUCAP 134*    Intake/Output from previous day:  Intake/Output Summary (Last 24 hours) at 10/25/14 0720 Last data filed at 10/25/14 0445  Gross per 24 hour  Intake   1886 ml  Output    596 ml  Net   1290 ml   12/21 0701 - 12/22 0700 In: 1886 [P.O.:720; I.V.:250; Blood:916] Out: 596 [Urine:595; Stool:1]   Physical Exam  General appearance: A and O - Less pale Throat: oropharynx moist without erythema Resp: CTA B Cardio: Irreg Irreg GI: soft, non-tender; bowel sounds normal; no masses,  no organomegaly Extremities: R Thigh Massive hematoma less swollen.  Not Tense - looks better already  Lab Results:  Recent Labs  10/24/14 1535  NA 141  K 3.6*  CL 99  CO2 26  GLUCOSE 173*  BUN 57*  CREATININE 1.77*  CALCIUM 8.8     Recent Labs  10/24/14 1535  AST 19  ALT 12  ALKPHOS 46  BILITOT 1.6*  PROT 6.1  ALBUMIN 3.2*     Recent Labs  10/24/14 1535  WBC 24.2*  HGB 7.5*  HCT 23.8*  MCV 86.5  PLT 1025*    Lab Results  Component Value Date   INR 4.46* 10/24/2014   INR 2.49* 02/06/2012    No results for input(s): CKTOTAL,  CKMB, CKMBINDEX, TROPONINI in the last 72 hours.  No results for input(s): TSH, T4TOTAL, T3FREE, THYROIDAB in the last 72 hours.  Invalid input(s): FREET3  No results for input(s): VITAMINB12, FOLATE, FERRITIN, TIBC, IRON, RETICCTPCT in the last 72 hours.  Micro Results: No results found for this or any previous visit (from the past 240 hour(s)).   Studies/Results: Dg Chest Port 1 View  10/25/2014   CLINICAL DATA:  Acute onset of atrial fibrillation. Initial encounter.  EXAM: PORTABLE CHEST - 1 VIEW  COMPARISON:  None.  FINDINGS: The lungs are well-aerated and clear. There is no evidence of focal opacification, pleural effusion or pneumothorax. The left costophrenic angle is incompletely imaged on this study.  The cardiomediastinal silhouette is borderline normal in size. No acute osseous abnormalities are seen.  IMPRESSION: No acute cardiopulmonary process seen.   Electronically Signed   By: Garald Balding M.D.   On: 10/25/2014 00:48     Medications: Scheduled:  Continuous: . sodium chloride 30 mL/hr at 10/24/14 1623     Assessment/Plan: Active Problems:   Anticoagulation excessive   Atrial fibrillation   Hematoma   Hypotension   Admitted 12/21 from my office with Massive R thigh Hematoma, Supratherapeutic INR, Sxatic Blood loss Anemia, hypotension, Leg pain and AFTT Given Vit K  and FFP to reverse coumadin.  Current INR P.  7.6 in my office.  4.46 here. BP meds held. Fluid resuscitated 2U PRBCs given for Hbg 7.5.  Blood just finished 5 am and Blood to be drawn at 8am Will have low threshold for Thigh CT scan - No compartment syndrome appreciated.   DM2 - Diet controlled -  Recent Labs  10/24/14 1734  GLUCAP 134*   Gait imbalance - Cane and brace usually - Has known foot drop.  Will start PT when swelling diminishes and labs stable - maybe later vrs tomorrow.  Wheelchair as needed  COUMADIN THERAPY d/t Afib - Coumadin on hold and may not be restarted. Discussed c  pt S/P Vit K and FFP. Elevate leg  DNR by his choice.  ATRIAL FIBRILLATION -  Rate controlled. Tolerating Rate control only. last ECHO showed Mod-Severe LVH. Mild Diastolic Dysfunction. Nml Valves. Some septal Hypertrophy without obstruction.Stay off ASA. Hold BB until [facility] to restart - certainly by tomorrow.  HRs listed 60-120 HTN- Hold BP meds  Psoriasis - NTD  BPH/Hydrocele/Spermatocele/Overactive Bladder - Foley c his imobility.  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

## 2014-10-25 NOTE — Clinical Social Work Note (Signed)
Patient is from [place] is to return there at [place] signing off.  Domenica Reamer, [place] Social Worker [phone]

## 2014-10-25 NOTE — Clinical Documentation Improvement (Signed)
10/25/14  Dear Dr. Virgina Jock,  Presents with "massive right thigh hematoma" with subtherapeutic INR.    Patient given FFP and Vitamin K to reverse Coumadin  Received 2u PRBC's  "Sxatic Blood loss Anemia" is documented in 10/25/14 progress note  Please clarify the acuity of the patient's "blood loss anemia"   Acute Blood Loss Anemia  Acute on chronic blood loss anemia  Chronic blood loss anemia  Other Condition________________  Angela Adam ,RN Clinical Documentation Specialist:  [place] [employer]

## 2014-10-26 ENCOUNTER — Inpatient Hospital Stay (HOSPITAL_COMMUNITY): Payer: Medicare Other

## 2014-10-26 LAB — URINE MICROSCOPIC-ADD ON

## 2014-10-26 LAB — URINALYSIS, ROUTINE W REFLEX MICROSCOPIC
Bilirubin Urine: NEGATIVE
Glucose, UA: NEGATIVE mg/dL
Ketones, ur: NEGATIVE mg/dL
Nitrite: NEGATIVE
Protein, ur: NEGATIVE mg/dL
Specific Gravity, Urine: 1.02 (ref 1.005–1.030)
Urobilinogen, UA: 1 mg/dL (ref 0.0–1.0)
pH: 5.5 (ref 5.0–8.0)

## 2014-10-26 LAB — BASIC METABOLIC PANEL
Anion gap: 7 (ref 5–15)
BUN: 36 mg/dL — ABNORMAL HIGH (ref 6–23)
CO2: 28 mmol/L (ref 19–32)
Calcium: 7.9 mg/dL — ABNORMAL LOW (ref 8.4–10.5)
Chloride: 102 mEq/L (ref 96–112)
Creatinine, Ser: 1.13 mg/dL (ref 0.50–1.35)
GFR calc Af Amer: 65 mL/min — ABNORMAL LOW (ref >90)
GFR calc non Af Amer: 56 mL/min — ABNORMAL LOW (ref >90)
Glucose, Bld: 158 mg/dL — ABNORMAL HIGH (ref 70–99)
Potassium: 3.4 mmol/L — ABNORMAL LOW (ref 3.5–5.1)
Sodium: 137 mmol/L (ref 135–145)

## 2014-10-26 LAB — CBC
HCT: 26.6 % — ABNORMAL LOW (ref 39.0–52.0)
Hemoglobin: 8.5 g/dL — ABNORMAL LOW (ref 13.0–17.0)
MCH: 27.5 pg (ref 26.0–34.0)
MCHC: 32 g/dL (ref 30.0–36.0)
MCV: 86.1 fL (ref 78.0–100.0)
Platelets: 680 10*3/uL — ABNORMAL HIGH (ref 150–400)
RBC: 3.09 MIL/uL — ABNORMAL LOW (ref 4.22–5.81)
RDW: 16.9 % — ABNORMAL HIGH (ref 11.5–15.5)
WBC: 14.2 10*3/uL — ABNORMAL HIGH (ref 4.0–10.5)

## 2014-10-26 LAB — PROTIME-INR
INR: 2.56 — ABNORMAL HIGH (ref 0.00–1.49)
Prothrombin Time: 27.7 seconds — ABNORMAL HIGH (ref 11.6–15.2)

## 2014-10-26 LAB — GLUCOSE, CAPILLARY
Glucose-Capillary: 153 mg/dL — ABNORMAL HIGH (ref 70–99)
Glucose-Capillary: 159 mg/dL — ABNORMAL HIGH (ref 70–99)

## 2014-10-26 MED ORDER — TAMSULOSIN HCL 0.4 MG PO CAPS
0.4000 mg | ORAL_CAPSULE | Freq: Every day | ORAL | Status: DC
  Administered 2014-10-26: 0.4 mg via ORAL
  Filled 2014-10-26 (×2): qty 1

## 2014-10-26 MED ORDER — VANCOMYCIN HCL IN DEXTROSE 1-5 GM/200ML-% IV SOLN
1000.0000 mg | Freq: Once | INTRAVENOUS | Status: AC
  Administered 2014-10-26: 1000 mg via INTRAVENOUS
  Filled 2014-10-26: qty 200

## 2014-10-26 MED ORDER — POTASSIUM CHLORIDE CRYS ER 20 MEQ PO TBCR
20.0000 meq | EXTENDED_RELEASE_TABLET | Freq: Once | ORAL | Status: AC
  Administered 2014-10-26: 20 meq via ORAL
  Filled 2014-10-26: qty 1

## 2014-10-26 MED ORDER — CEFTRIAXONE SODIUM IN DEXTROSE 20 MG/ML IV SOLN
1.0000 g | Freq: Once | INTRAVENOUS | Status: AC
  Administered 2014-10-26: 1 g via INTRAVENOUS
  Filled 2014-10-26: qty 50

## 2014-10-26 MED ORDER — ATENOLOL 12.5 MG HALF TABLET
12.5000 mg | ORAL_TABLET | Freq: Every day | ORAL | Status: DC
  Administered 2014-10-26 – 2014-10-27 (×2): 12.5 mg via ORAL
  Filled 2014-10-26 (×2): qty 1

## 2014-10-26 NOTE — Progress Notes (Signed)
Utilization Review Completed.William Ibarra T12/23/2015  

## 2014-10-26 NOTE — Evaluation (Signed)
Physical Therapy Evaluation Patient Details Name: William Ibarra MRN: [id number] DOB: 1924-07-17 Today's Date: 10/26/2014   History of Present Illness  Pt admitted 12/21 for R thigh large hematoma.  Clinical Impression  Pt progressing well. Pt currently requires assist for lower body dressing and remains to requires RW for safe ambulation. Dtr reports she will stay through the weekend to make sure he is settled in. Pt motivated and progressing well. Pt to benefit from return to [facility] at [facility] [place] [facility] through [facility].    Follow Up Recommendations Home health PT;Supervision/Assistance - 24 hour (24/7 supervision initially)    Equipment Recommendations  None recommended by PT (pt has RW at home)    Recommendations for Other Services       Precautions / Restrictions Precautions Precautions: Fall Restrictions Weight Bearing Restrictions: No      Mobility  Bed Mobility Overal bed mobility:  (pt received sitting up in chair)                Transfers Overall transfer level: Needs assistance Equipment used: Rolling walker (2 wheeled) Transfers: Sit to/from Stand Sit to Stand: Min guard         General transfer comment: v/c's for hand placement, increased time  Ambulation/Gait Ambulation/Gait assistance: Min guard Ambulation Distance (Feet): 50 Feet Assistive device: Rolling walker (2 wheeled) Gait Pattern/deviations: Step-through pattern;Decreased stride length (wears R brace for drop foot) Gait velocity: slow Gait velocity interpretation: Below normal speed for age/gender General Gait Details: pt with antalgic gait but able to amb with RW without LOB, easily fatigues  Stairs            Wheelchair Mobility    Modified Rankin (Stroke Patients Only)       Balance Overall balance assessment: Needs assistance         Standing balance support: Bilateral upper extremity supported Standing balance-Leahy Scale: Poor Standing balance comment: requires  bilat UE support for dynamic standing to prevent fall                             Pertinent Vitals/Pain      Home Living Family/patient expects to be discharged to::  (independent living at [facility] [place])                      Prior Function Level of Independence: Independent with assistive device(s)         Comments: used cane, used walker last couple of days b/c of leg pain     Hand Dominance   Dominant Hand: Right    Extremity/Trunk Assessment   Upper Extremity Assessment: Generalized weakness           Lower Extremity Assessment: RLE deficits/detail;Generalized weakness RLE Deficits / Details: significant thigh bruising, drop foot, wears brace for ambulation    Cervical / Trunk Assessment: Kyphotic  Communication   Communication: No difficulties  Cognition Arousal/Alertness: Awake/alert Behavior During Therapy: WFL for tasks assessed/performed Overall Cognitive Status: Within Functional Limits for tasks assessed                      General Comments General comments (skin integrity, edema, etc.): pt's dtr assisted pt to/from bathroom prior to PT arrival    Exercises General Exercises - Lower Extremity Ankle Circles/Pumps: AROM;Both;10 reps;Seated Long Arc Quad: AROM;Right;10 reps;Seated      Assessment/Plan    PT Assessment Patient needs continued PT services  PT Diagnosis Difficulty walking;Acute pain   PT Problem List Decreased strength;Decreased range of motion;Decreased activity tolerance;Decreased balance;Decreased mobility  PT Treatment Interventions Gait training;DME instruction;Stair training;Functional mobility training;Therapeutic activities;Therapeutic exercise;Balance training   PT Goals (Current goals can be found in the Care Plan section) Acute Rehab PT Goals Patient Stated Goal: home PT Goal Formulation: With patient Time For Goal Achievement: 11/02/14 Potential to Achieve Goals: Good    Frequency  Min 3X/week   Barriers to discharge        Co-evaluation               End of Session Equipment Utilized During Treatment: Gait belt Activity Tolerance: Patient tolerated treatment well Patient left: in chair;with call bell/phone within reach;with family/visitor present Nurse Communication: Mobility status         Time: 3662-9476 PT Time Calculation (min) (ACUTE ONLY): 28 min   Charges:   PT Evaluation $Initial PT Evaluation Tier I: 1 Procedure PT Treatments $Gait Training: 8-22 mins   PT G CodesKingsley Callander 10/26/2014, 12:36 PM   Kittie Plater, PT, DPT Pager #: [phone] Office #: [phone]

## 2014-10-26 NOTE — Progress Notes (Signed)
Subjective:  2 more U PRBCs given Remains mentally fine. Foley in place He looks better. No CP or SOB. Doing well.   Objective: Vital signs in last 24 hours: Temp:  [98.3 F (36.8 C)-99.8 F (37.7 C)] 99.8 F (37.7 C) (12/23 0552) Pulse Rate:  [71-100] 94 (12/23 0552) Resp:  [18-19] 18 (12/23 0552) BP: (102-139)/(37-69) 129/69 mmHg (12/23 0552) SpO2:  [94 %-100 %] 99 % (12/23 0552) Weight:  [111.358 kg (245 lb 8 oz)] 111.358 kg (245 lb 8 oz) (12/23 0552) Weight change: 3.402 kg (7 lb 8 oz) Last BM Date: 10/24/14  CBG (last 3)   Recent Labs  10/24/14 1734 10/25/14 0745 10/25/14 1258  GLUCAP 134* 146* 147*    Intake/Output from previous day:  Intake/Output Summary (Last 24 hours) at 10/26/14 1497 Last data filed at 10/26/14 0500  Gross per 24 hour  Intake    910 ml  Output   2100 ml  Net  -1190 ml   12/22 0701 - 12/23 0700 In: 910 [I.V.:240; Blood:670] Out: 2100 [Urine:2100]   Physical Exam  General appearance: A and O - Less pale Throat: oropharynx moist without erythema Resp: CTA B Cardio: Irreg Irreg GI: soft, non-tender; bowel sounds normal; no masses,  no organomegaly Extremities: R Thigh Massive hematoma less swollen.  Not Tense - looks better already  Lab Results:  Recent Labs  10/25/14 0830 10/26/14 0336  NA 139 137  K 3.4* 3.4*  CL 102 102  CO2 28 28  GLUCOSE 182* 158*  BUN 55* 36*  CREATININE 1.63* 1.13  CALCIUM 8.1* 7.9*     Recent Labs  10/24/14 1535  AST 19  ALT 12  ALKPHOS 46  BILITOT 1.6*  PROT 6.1  ALBUMIN 3.2*     Recent Labs  10/25/14 1900 10/26/14 0336  WBC 15.4* 14.2*  HGB 8.8* 8.5*  HCT 27.3* 26.6*  MCV 86.4 86.1  PLT 708* 680*    Lab Results  Component Value Date   INR 2.56* 10/26/2014   INR 2.81* 10/25/2014   INR 4.46* 10/24/2014    No results for input(s): CKTOTAL, CKMB, CKMBINDEX, TROPONINI in the last 72 hours.  No results for input(s): TSH, T4TOTAL, T3FREE, THYROIDAB in the last 72  hours.  Invalid input(s): FREET3  No results for input(s): VITAMINB12, FOLATE, FERRITIN, TIBC, IRON, RETICCTPCT in the last 72 hours.  Micro Results: No results found for this or any previous visit (from the past 240 hour(s)).   Studies/Results: Dg Chest Port 1 View  10/25/2014   CLINICAL DATA:  Acute onset of atrial fibrillation. Initial encounter.  EXAM: PORTABLE CHEST - 1 VIEW  COMPARISON:  None.  FINDINGS: The lungs are well-aerated and clear. There is no evidence of focal opacification, pleural effusion or pneumothorax. The left costophrenic angle is incompletely imaged on this study.  The cardiomediastinal silhouette is borderline normal in size. No acute osseous abnormalities are seen.  IMPRESSION: No acute cardiopulmonary process seen.   Electronically Signed   By: Garald Balding M.D.   On: 10/25/2014 00:48     Medications: Scheduled:  Continuous: . sodium chloride 30 mL/hr at 10/25/14 2056     Assessment/Plan: Active Problems:   Anticoagulation excessive   Atrial fibrillation   Hematoma   Hypotension   Admitted 12/21 from my office with Massive R thigh Hematoma, Supratherapeutic INR, Sxatic ACUTE Blood loss Anemia, hypotension, Leg pain and AFTT Given Vit K and FFP to reverse coumadin.  INRs 7.6 in my office - 4.46 -  2.81 - 2.56 BP meds held. S/P Fluid resuscitation 4U PRBCs given and Hbg finally up to 8.5/8.8 Will have low threshold for Thigh CT scan - No compartment syndrome appreciated.   DM2 - Diet controlled -   Recent Labs  10/24/14 1734 10/25/14 0745 10/25/14 1258  GLUCAP 134* 146* 147*   Gait imbalance - Cane and brace usually - Has known foot drop.  Will start PT today  COUMADIN THERAPY d/t Afib - Coumadin on hold and may not be restarted. Discussed c pt S/P Vit K and FFP. Elevate leg  DNR by his choice.  Leukocytosis s signs of infection.  Hypokalemia - Replete  ATRIAL FIBRILLATION -  Rate controlled. Tolerating Rate control only. last  ECHO showed Mod-Severe LVH. Mild Diastolic Dysfunction. Nml Valves. Some septal Hypertrophy without obstruction.Stay off ASA. Restart BB .  HRs listed 71-100  HTN- BB restarted at 12.5 instead of 25 - will allow room to start flomax  Psoriasis - NTD  BPH/Hydrocele/Spermatocele/Overactive Bladder - Foley c his imobility.  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

## 2014-10-27 LAB — BASIC METABOLIC PANEL
Anion gap: 7 (ref 5–15)
BUN: 25 mg/dL — ABNORMAL HIGH (ref 6–23)
CO2: 27 mmol/L (ref 19–32)
Calcium: 7.7 mg/dL — ABNORMAL LOW (ref 8.4–10.5)
Chloride: 100 mEq/L (ref 96–112)
Creatinine, Ser: 1.02 mg/dL (ref 0.50–1.35)
GFR calc Af Amer: 74 mL/min — ABNORMAL LOW (ref >90)
GFR calc non Af Amer: 63 mL/min — ABNORMAL LOW (ref >90)
Glucose, Bld: 138 mg/dL — ABNORMAL HIGH (ref 70–99)
Potassium: 3.1 mmol/L — ABNORMAL LOW (ref 3.5–5.1)
Sodium: 134 mmol/L — ABNORMAL LOW (ref 135–145)

## 2014-10-27 LAB — PREPARE RBC (CROSSMATCH)

## 2014-10-27 LAB — CBC
HCT: 24.3 % — ABNORMAL LOW (ref 39.0–52.0)
Hemoglobin: 8 g/dL — ABNORMAL LOW (ref 13.0–17.0)
MCH: 29.4 pg (ref 26.0–34.0)
MCHC: 32.9 g/dL (ref 30.0–36.0)
MCV: 89.3 fL (ref 78.0–100.0)
Platelets: 594 10*3/uL — ABNORMAL HIGH (ref 150–400)
RBC: 2.72 MIL/uL — ABNORMAL LOW (ref 4.22–5.81)
RDW: 17.2 % — ABNORMAL HIGH (ref 11.5–15.5)
WBC: 13.2 10*3/uL — ABNORMAL HIGH (ref 4.0–10.5)

## 2014-10-27 LAB — PROTIME-INR
INR: 1.48 (ref 0.00–1.49)
Prothrombin Time: 18.1 seconds — ABNORMAL HIGH (ref 11.6–15.2)

## 2014-10-27 MED ORDER — CEPHALEXIN 500 MG PO CAPS: 500.0000 mg | ORAL_CAPSULE | Freq: Three times a day (TID) | ORAL | Status: DC

## 2014-10-27 MED ORDER — TAMSULOSIN HCL 0.4 MG PO CAPS: 0.4000 mg | ORAL_CAPSULE | Freq: Every day | ORAL | Status: AC

## 2014-10-27 MED ORDER — CEPHALEXIN 500 MG PO CAPS
500.0000 mg | ORAL_CAPSULE | Freq: Three times a day (TID) | ORAL | Status: DC
  Administered 2014-10-27 (×2): 500 mg via ORAL
  Filled 2014-10-27 (×2): qty 1

## 2014-10-27 MED ORDER — ACETAMINOPHEN 325 MG PO TABS: 650.0000 mg | ORAL_TABLET | Freq: Four times a day (QID) | ORAL | Status: DC | PRN

## 2014-10-27 MED ORDER — ATENOLOL 25 MG PO TABS
12.5000 mg | ORAL_TABLET | Freq: Every day | ORAL | Status: DC
Start: 2014-10-27 — End: 2020-09-02

## 2014-10-27 MED ORDER — POTASSIUM CHLORIDE CRYS ER 20 MEQ PO TBCR
20.0000 meq | EXTENDED_RELEASE_TABLET | Freq: Once | ORAL | Status: AC
  Administered 2014-10-27: 20 meq via ORAL
  Filled 2014-10-27: qty 1

## 2014-10-27 MED ORDER — SODIUM CHLORIDE 0.9 % IV SOLN
Freq: Once | INTRAVENOUS | Status: AC
  Administered 2014-10-27: 11:00:00 via INTRAVENOUS

## 2014-10-27 NOTE — Progress Notes (Signed)
Patient had episode of incontinence and was bladder scanned afterward for 453mL.  Patient/family still refuse I/O catheter.

## 2014-10-27 NOTE — Progress Notes (Signed)
Pt to have HHPT after discharge. He lives at [facility] independent living. Reports that he takes the Rx to the facility and they make the arrangements with [facility] for the home care.  I have given the MD order for HHPT, the Medicare Face to Face form to the patient.  Assigned RN, Lovena Le, aware.  Medicare IM (Important Message) delivered to patient today by me in anticipation of discharge.   Sandi Mariscal, RN BSN MHA CCM  Case Manager, Trauma Service/Unit [facility] [phone]

## 2014-10-27 NOTE — Progress Notes (Signed)
Hilbert Corrigan to be D/C'd to [place] per MD order.  Discussed with the patient and all questions fully answered.  VSS, Skin clean, dry and intact without evidence of skin break down, no evidence of skin tears noted. IV catheter discontinued intact. Site without signs and symptoms of complications. Dressing and pressure applied.  An After Visit Summary was printed and given to the patient. Patient received prescriptions.  D/c education completed with patient/family including follow up instructions, medication list, d/c activities limitations if indicated, with other d/c instructions as indicated by MD - patient able to verbalize understanding, all questions fully answered.   Patient instructed to return to ED, call 911, or call MD for any changes in condition.   Patient escorted via [place], and D/C home via private auto.  Micki Riley 10/27/2014 2:34 PM

## 2014-10-27 NOTE — Progress Notes (Signed)
Subjective:  Foley removed but he has sig post void residual and does not want I and O or Floey back in. Discussed. He did have a large Volume Incontinenet episode last night Did well c PT. Fevers to 99.8-100.5 yesterday - Cx obtained and Vanco/Rocephin given He looks fine but issues yesterday took away his confidence No CP or SOB. Doing well.   Objective: Vital signs in last 24 hours: Temp:  [98.8 F (37.1 C)-100.5 F (38.1 C)] 98.8 F (37.1 C) (12/24 0612) Pulse Rate:  [81-99] 93 (12/24 0612) Resp:  [17-20] 17 (12/24 0612) BP: (102-129)/(44-58) 129/58 mmHg (12/24 0612) SpO2:  [98 %-100 %] 100 % (12/24 0612) Weight:  [110.269 kg (243 lb 1.6 oz)] 110.269 kg (243 lb 1.6 oz) (12/24 0612) Weight change: -1.089 kg (-2 lb 6.4 oz) Last BM Date: 10/24/14  CBG (last 3)   Recent Labs  10/25/14 1258 10/26/14 0820 10/26/14 1203  GLUCAP 147* 153* 159*    Intake/Output from previous day:  Intake/Output Summary (Last 24 hours) at 10/27/14 0740 Last data filed at 10/26/14 2149  Gross per 24 hour  Intake    480 ml  Output    785 ml  Net   -305 ml   12/23 0701 - 12/24 0700 In: 480 [P.O.:480] Out: 785 [Urine:785]   Physical Exam  General appearance: A and O - Less pale Throat: oropharynx moist without erythema Resp: CTA B Cardio: Irreg Irreg GI: soft, non-tender; bowel sounds normal; no masses,  no organomegaly Extremities: R Thigh Massive hematoma less swollen and more spread out.  No cellulitis.  Not Tense - looks better.  Lab Results:  Recent Labs  10/25/14 0830 10/26/14 0336  NA 139 137  K 3.4* 3.4*  CL 102 102  CO2 28 28  GLUCOSE 182* 158*  BUN 55* 36*  CREATININE 1.63* 1.13  CALCIUM 8.1* 7.9*     Recent Labs  10/24/14 1535  AST 19  ALT 12  ALKPHOS 46  BILITOT 1.6*  PROT 6.1  ALBUMIN 3.2*     Recent Labs  10/26/14 0336 10/27/14 0639  WBC 14.2* 13.2*  HGB 8.5* 8.0*  HCT 26.6* 24.3*  MCV 86.1 89.3  PLT 680* 594*    Lab Results   Component Value Date   INR 2.56* 10/26/2014   INR 2.81* 10/25/2014   INR 4.46* 10/24/2014    No results for input(s): CKTOTAL, CKMB, CKMBINDEX, TROPONINI in the last 72 hours.  No results for input(s): TSH, T4TOTAL, T3FREE, THYROIDAB in the last 72 hours.  Invalid input(s): FREET3  No results for input(s): VITAMINB12, FOLATE, FERRITIN, TIBC, IRON, RETICCTPCT in the last 72 hours.  Micro Results: No results found for this or any previous visit (from the past 240 hour(s)).   Studies/Results: Dg Chest Port 1 View  10/26/2014   CLINICAL DATA:  Initial encounter. Fever  EXAM: PORTABLE CHEST - 1 VIEW  COMPARISON:  Full 11/23/2013  FINDINGS: There is no focal parenchymal opacity, pleural effusion, or pneumothorax. The heart and mediastinal contours are unremarkable.  The osseous structures are unremarkable.  IMPRESSION: No active disease.   Electronically Signed   By: Kathreen Devoid   On: 10/26/2014 18:42     Medications: Scheduled: . sodium chloride   Intravenous Once  . atenolol  12.5 mg Oral Daily  . tamsulosin  0.4 mg Oral QHS   Continuous:     Assessment/Plan: Active Problems:   Anticoagulation excessive   Atrial fibrillation   Hematoma   Hypotension  Admitted 12/21 from my office with Massive R thigh Hematoma, Supratherapeutic INR, Sxatic ACUTE Blood loss Anemia, hypotension, Leg pain and AFTT Given Vit K and FFP to reverse coumadin.  INRs 7.6 in my office - 4.46 - 2.81 - 2.56 - P BP meds held initially and slowly restarted. S/P Fluid resuscitation 4U PRBCs given and Hbg finally up to 8.5/8.8.  Now 8.0 - give one more unit PRBC and may be ready for D/c post lunch? Will have low threshold for Thigh CT scan - No compartment syndrome appreciated.  No cellulitis appreciated   DM2 - Diet controlled -   Recent Labs  10/25/14 1258 10/26/14 0820 10/26/14 1203  GLUCAP 147* 153* 159*   Gait imbalance - Cane and brace usually - Has known foot drop.  S/p PT 12/23 c rec  = Home health PT;Supervision/Assistance - 24 hour (24/7 supervision initially)  COUMADIN THERAPY d/t Afib - Coumadin on hold and may not be restarted. Discussed c pt S/P Vit K and FFP. Elevate leg  DNR by his choice.  Leukocytosis and fevers to 99.8-100.5 yesterday.  Given one dose Vanco/Rocephin 12/23.  UA More blood - does not look like UTI but most probable source of fever would be the blood itself or UTI.  He has mild dry cough s sig Sxs or + CXR.  Blood Cx P.   s signs of infection.  CXR NACPDz.  Hypokalemia - Repleted  ATRIAL FIBRILLATION -  Rate controlled. Tolerating Rate control only. last ECHO showed Mod-Severe LVH. Mild Diastolic Dysfunction. Nml Valves. Some septal Hypertrophy without obstruction.Stay off ASA. Restart BB .  HRs fine  HTN- BB restarted at 12.5 instead of 25 - will allow room to start flomax  Psoriasis - NTD  BPH/Hydrocele/Spermatocele/Overactive Bladder/urinary retention c Bladder scan showing up to 714cc - pt and family refused to under go I&O cath like order stated. Will continue to monitor pt out-put.  - Foley initially for his imobility.  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

## 2014-10-27 NOTE — Progress Notes (Signed)
Bladder scan pt per order. Bladder scan showed 714cc, but pt and family refused to under go I&O cath like order stated. Will continue to monitor pt out-put.

## 2014-10-27 NOTE — Discharge Summary (Signed)
Physician Discharge Summary  DISCHARGE SUMMARY   Patient ID: William Ibarra MR#: [id number] DOB/AGE: 1924-01-31 80 y.o.   Attending 101 M  Patient's [id number],XBLT M, MD  Consults:  none  Admit date: 10/24/2014 Discharge date: 10/27/2014  Discharge Diagnoses:  Active Problems:   Anticoagulation excessive   Atrial fibrillation   Hematoma   Hypotension   Patient Active Problem List   Diagnosis Date Noted  . Hematoma 10/24/2014  . Hypotension 10/24/2014  . Anemia 02/06/2012    Class: Acute  . Hypotension 02/05/2012    Class: Acute  . Anticoagulation excessive 02/05/2012    Class: Acute  . Diabetes mellitus type 2, controlled 02/05/2012    Class: Acute  . Atrial fibrillation 02/05/2012    Class: Chronic  . Psoriasis 02/05/2012    Class: Chronic   Past Medical History  Diagnosis Date  . Hypertension   . Atrial fibrillation   . Hypotension 02/05/2012  . BPH (benign prostatic hypertrophy)   . Diabetes mellitus   . Diverticulosis of colon     from a prior colonoscopy  . Hydrocele   . Psoriasis   . Rosacea   . Spermatocele   . Hydrocele   . Impaired glucose tolerance   . Overactive bladder   . History of blood transfusion ~ 10/2013; 10/24/2014    "related to blood thinners"    Discharged Condition:  Improved   Discharge Medications:   Medication List    STOP taking these medications        ibuprofen 200 MG tablet  Commonly known as:  ADVIL,MOTRIN     losartan 100 MG tablet  Commonly known as:  COZAAR     warfarin 5 MG tablet  Commonly known as:  COUMADIN      TAKE these medications        acetaminophen 325 MG tablet  Commonly known as:  TYLENOL  Take 2 tablets (650 mg total) by mouth every 6 (six) hours as needed for mild pain, moderate pain, fever or headache (or Fever >/= 101).     atenolol 25 MG tablet  Commonly known as:  TENORMIN  Take 0.5 tablets (12.5 mg total) by mouth daily.     cephALEXin 500 MG capsule   Commonly known as:  KEFLEX  Take 1 capsule (500 mg total) by mouth every 8 (eight) hours.     tamsulosin 0.4 MG Caps capsule  Commonly known as:  FLOMAX  Take 1 capsule (0.4 mg total) by mouth at bedtime.        Hospital Procedures: Dg Chest Port 1 View  10/26/2014   CLINICAL DATA:  Initial encounter. Fever  EXAM: PORTABLE CHEST - 1 VIEW  COMPARISON:  Full 11/23/2013  FINDINGS: There is no focal parenchymal opacity, pleural effusion, or pneumothorax. The heart and mediastinal contours are unremarkable.  The osseous structures are unremarkable.  IMPRESSION: No active disease.   Electronically Signed   By: Kathreen Devoid   On: 10/26/2014 18:42   Dg Chest Port 1 View  10/25/2014   CLINICAL DATA:  Acute onset of atrial fibrillation. Initial encounter.  EXAM: PORTABLE CHEST - 1 VIEW  COMPARISON:  None.  FINDINGS: The lungs are well-aerated and clear. There is no evidence of focal opacification, pleural effusion or pneumothorax. The left costophrenic angle is incompletely imaged on this study.  The cardiomediastinal silhouette is borderline normal in size. No acute osseous abnormalities are seen.  IMPRESSION: No acute cardiopulmonary process seen.   Electronically Signed   By:  Garald Balding M.D.   On: 10/25/2014 00:48    History of Present Illness: 48 Male seen 12/21 for w/in Sick visit and promptly admitted.  R hip/leg was not working well, it started to swell over the wekend and had been getting worse. Tight. No SOB but DOE + Pains in leg swollen since saturday only getting worse. Has taken IBU.  I pulled his pants down and he had a massive Hematoma. Had not fallen. No [place]. Last INR was thin at 4.3 12/17 INR in our office > 7 and needed admission  Hospital Course: Admitted 12/21 from my office with Massive R thigh Hematoma, Supratherapeutic INR, Sxatic Blood loss Anemia, hypotension, Leg pain and AFTT Given Vit K and FFP to reverse coumadin. BP meds held. Fluid resuscitation  given 4U PRBCs given - 5 Units total after current one done getting in. Initial Hypotension improved quickly  He remained mentally fine.  Foley placed to help urinate Foley removed but he has sig post void residual and does not want I and O or Foley back in. He had 2 large Volume Incontinent episodes.  Has actually been urinating more and better on 12/24 in the potty and had issues c the urinal Did well c PT. Fevers to 99.8-100.5 yesterday - Cx obtained and Vanco/Rocephin given.  No fever today. He looks fine but issues yesterday took away his confidence He gained it back during toay by getting up and moving about better. He had > 700 cc PVR 12/23 and > 400 today - Less Sxs.  He did not want I and O catheter or Uro Ev No CP or SOB. Doing well.   Admitted 12/21 from my office with Massive R thigh Hematoma, Supratherapeutic INR, Sxatic ACUTE Blood loss Anemia, hypotension, Leg pain and AFTT Given Vit K and FFP to reverse coumadin. INRs 7.6 in my office - 4.46 - 2.81 - 2.56 - 1.48 BP meds held initially and slowly restarted. S/P Fluid resuscitation 4U PRBCs given and Hbg finally up to 8.5/8.8. Now 8.0 - one more unit PRBC and will be ready for D/c post lunch? I will repeat CBC in my office Dec 31 He never needed the Thigh CT scan - No compartment syndrome appreciated. No cellulitis appreciated   DM2 - Diet controlled -   Recent Labs (last 2 labs)      Recent Labs  10/25/14 1258 10/26/14 0820 10/26/14 1203  GLUCAP 147* 153* 159*     Gait imbalance - Cane and brace usually - Has known foot drop. S/p PT 12/23 c rec = Home health PT;Supervision/Assistance - 24 hour (24/7 supervision initially)  COUMADIN THERAPY d/t Afib - Coumadin on hold and may not be restarted. Discussed c pt S/P Vit K and FFP. Elevate leg  DNR by his choice.  Leukocytosis and fevers to 99.8-100.5 yesterday. Given one dose Vanco/Rocephin 12/23. UA More blood - does not look like UTI but most  probable source of fever would be the blood itself or UTI. He has mild dry cough s sig Sxs or + CXR. Blood Cx P. s signs of infection. CXR NACPDz.  Hypokalemia - Repleted yesterday and today.  Needs BMET in our office  ATRIAL FIBRILLATION - Rate controlled. Tolerating Rate control only. last ECHO showed Mod-Severe LVH. Mild Diastolic Dysfunction. Nml Valves. Some septal Hypertrophy without obstruction.Stay off ASA. Restart BB . HRs fine.  Pt at risk of CVA off the coumadin and after reversal but current bleed risk outweighs future risk of CVA  currently  HTN- BB restarted at 12.5 instead of 25 - will allow room to start flomax  Psoriasis - NTD  BPH/Hydrocele/Spermatocele/Overactive Bladder/urinary retention c Bladder scan showed up to 714cc and 410 cc - pt and family refused to undergo I&O cath like order stated. Will continue to monitor pt out-put. - Foley initially for his imobility. If BP tolerates = continue flomax and bladder training He had large vol Incont episode x 2 and Urinating better.    Recent SCC on l Cheek S/P Mohs 08/2014 and Had R Eye Melanoma7/2015 - Excised and Frozen - Still c eyesight  DVT Prophylaxis - Squeezers only c bleeding  Remains Afebrile - Finish Keflex.  Start Flomax.  Given one more unit PRBC's     Day of Discharge Exam BP 104/48 mmHg  Pulse 84  Temp(Src) 97.9 F (36.6 C) (Oral)  Resp 17  Ht 6\' 1"  (1.854 m)  Wt 110.269 kg (243 lb 1.6 oz)  BMI 32.08 kg/m2  SpO2 99%  Physical Exam:  See am PN   Discharge Labs:  Recent Labs  10/26/14 0336 10/27/14 0639  NA 137 134*  K 3.4* 3.1*  CL 102 100  CO2 28 27  GLUCOSE 158* 138*  BUN 36* 25*  CREATININE 1.13 1.02  CALCIUM 7.9* 7.7*    Recent Labs  10/24/14 1535  AST 19  ALT 12  ALKPHOS 46  BILITOT 1.6*  PROT 6.1  ALBUMIN 3.2*    Recent Labs  10/26/14 0336 10/27/14 0639  WBC 14.2* 13.2*  HGB 8.5* 8.0*  HCT 26.6* 24.3*  MCV 86.1 89.3  PLT 680* 594*   No  results for input(s): CKTOTAL, CKMB, CKMBINDEX, TROPONINI in the last 72 hours. No results for input(s): TSH, T4TOTAL, T3FREE, THYROIDAB in the last 72 hours.  Invalid input(s): FREET3 No results for input(s): VITAMINB12, FOLATE, FERRITIN, TIBC, IRON, RETICCTPCT in the last 72 hours. Lab Results  Component Value Date   INR 1.48 10/27/2014   INR 2.56* 10/26/2014   INR 2.81* 10/25/2014       Discharge instructions:  01-Home or Self Care     Follow-up Information    Follow up with Precious Reel, MD In 1 week.   Specialty:  Internal Medicine   Contact information:   [place] [address] [phone]        Disposition: Home c family watching him  Follow-up Appts: Follow-up with Dr. Virgina Jock at [facility] in 1 week.  Call for appointment.  Condition on Discharge: stable  Tests Needing Follow-up: CBC, BMET  Time spent in discharge (includes decision making & examination of pt): 40 min  Signed: Vahan Wadsworth M 10/27/2014, 12:17 PM

## 2014-10-28 LAB — TYPE AND SCREEN
ABO/RH(D): A POS
Antibody Screen: NEGATIVE
Unit division: 0
Unit division: 0
Unit division: 0
Unit division: 0
Unit division: 0

## 2014-11-02 LAB — CULTURE, BLOOD (SINGLE): Culture: NO GROWTH

## 2014-11-03 DIAGNOSIS — Z7901 Long term (current) use of anticoagulants: Secondary | ICD-10-CM | POA: Diagnosis not present

## 2014-11-03 DIAGNOSIS — R627 Adult failure to thrive: Secondary | ICD-10-CM | POA: Diagnosis not present

## 2014-11-03 DIAGNOSIS — T148 Other injury of unspecified body region: Secondary | ICD-10-CM | POA: Diagnosis not present

## 2014-11-03 DIAGNOSIS — E876 Hypokalemia: Secondary | ICD-10-CM | POA: Diagnosis not present

## 2014-11-03 DIAGNOSIS — I10 Essential (primary) hypertension: Secondary | ICD-10-CM | POA: Diagnosis not present

## 2014-11-03 DIAGNOSIS — D72829 Elevated white blood cell count, unspecified: Secondary | ICD-10-CM | POA: Diagnosis not present

## 2014-11-03 DIAGNOSIS — D649 Anemia, unspecified: Secondary | ICD-10-CM | POA: Diagnosis not present

## 2014-11-03 DIAGNOSIS — R600 Localized edema: Secondary | ICD-10-CM | POA: Diagnosis not present

## 2014-11-08 DIAGNOSIS — R262 Difficulty in walking, not elsewhere classified: Secondary | ICD-10-CM | POA: Diagnosis not present

## 2014-11-08 DIAGNOSIS — M7981 Nontraumatic hematoma of soft tissue: Secondary | ICD-10-CM | POA: Diagnosis not present

## 2014-11-09 DIAGNOSIS — R627 Adult failure to thrive: Secondary | ICD-10-CM | POA: Diagnosis not present

## 2014-11-09 DIAGNOSIS — H1820 Unspecified corneal edema: Secondary | ICD-10-CM | POA: Diagnosis not present

## 2014-11-09 DIAGNOSIS — R600 Localized edema: Secondary | ICD-10-CM | POA: Diagnosis not present

## 2014-11-09 DIAGNOSIS — D649 Anemia, unspecified: Secondary | ICD-10-CM | POA: Diagnosis not present

## 2014-11-10 DIAGNOSIS — R262 Difficulty in walking, not elsewhere classified: Secondary | ICD-10-CM | POA: Diagnosis not present

## 2014-11-10 DIAGNOSIS — M7981 Nontraumatic hematoma of soft tissue: Secondary | ICD-10-CM | POA: Diagnosis not present

## 2014-11-15 DIAGNOSIS — R262 Difficulty in walking, not elsewhere classified: Secondary | ICD-10-CM | POA: Diagnosis not present

## 2014-11-15 DIAGNOSIS — M7981 Nontraumatic hematoma of soft tissue: Secondary | ICD-10-CM | POA: Diagnosis not present

## 2014-11-17 DIAGNOSIS — R262 Difficulty in walking, not elsewhere classified: Secondary | ICD-10-CM | POA: Diagnosis not present

## 2014-11-17 DIAGNOSIS — M7981 Nontraumatic hematoma of soft tissue: Secondary | ICD-10-CM | POA: Diagnosis not present

## 2014-11-23 DIAGNOSIS — R262 Difficulty in walking, not elsewhere classified: Secondary | ICD-10-CM | POA: Diagnosis not present

## 2014-11-23 DIAGNOSIS — M7981 Nontraumatic hematoma of soft tissue: Secondary | ICD-10-CM | POA: Diagnosis not present

## 2014-11-26 DIAGNOSIS — D649 Anemia, unspecified: Secondary | ICD-10-CM | POA: Diagnosis not present

## 2014-11-26 DIAGNOSIS — N401 Enlarged prostate with lower urinary tract symptoms: Secondary | ICD-10-CM | POA: Diagnosis not present

## 2014-11-26 DIAGNOSIS — R627 Adult failure to thrive: Secondary | ICD-10-CM | POA: Diagnosis not present

## 2014-11-26 DIAGNOSIS — Z6833 Body mass index (BMI) 33.0-33.9, adult: Secondary | ICD-10-CM | POA: Diagnosis not present

## 2014-11-26 DIAGNOSIS — Z7901 Long term (current) use of anticoagulants: Secondary | ICD-10-CM | POA: Diagnosis not present

## 2014-11-26 DIAGNOSIS — R600 Localized edema: Secondary | ICD-10-CM | POA: Diagnosis not present

## 2014-11-26 DIAGNOSIS — E876 Hypokalemia: Secondary | ICD-10-CM | POA: Diagnosis not present

## 2014-11-26 DIAGNOSIS — I10 Essential (primary) hypertension: Secondary | ICD-10-CM | POA: Diagnosis not present

## 2014-11-26 DIAGNOSIS — I480 Paroxysmal atrial fibrillation: Secondary | ICD-10-CM | POA: Diagnosis not present

## 2014-11-26 DIAGNOSIS — D72829 Elevated white blood cell count, unspecified: Secondary | ICD-10-CM | POA: Diagnosis not present

## 2014-11-26 DIAGNOSIS — E119 Type 2 diabetes mellitus without complications: Secondary | ICD-10-CM | POA: Diagnosis not present

## 2014-11-26 DIAGNOSIS — T148 Other injury of unspecified body region: Secondary | ICD-10-CM | POA: Diagnosis not present

## 2014-11-29 DIAGNOSIS — M7981 Nontraumatic hematoma of soft tissue: Secondary | ICD-10-CM | POA: Diagnosis not present

## 2014-11-29 DIAGNOSIS — R262 Difficulty in walking, not elsewhere classified: Secondary | ICD-10-CM | POA: Diagnosis not present

## 2014-12-01 DIAGNOSIS — R262 Difficulty in walking, not elsewhere classified: Secondary | ICD-10-CM | POA: Diagnosis not present

## 2014-12-01 DIAGNOSIS — M7981 Nontraumatic hematoma of soft tissue: Secondary | ICD-10-CM | POA: Diagnosis not present

## 2014-12-06 DIAGNOSIS — R262 Difficulty in walking, not elsewhere classified: Secondary | ICD-10-CM | POA: Diagnosis not present

## 2014-12-06 DIAGNOSIS — M6281 Muscle weakness (generalized): Secondary | ICD-10-CM | POA: Diagnosis not present

## 2014-12-06 DIAGNOSIS — M7981 Nontraumatic hematoma of soft tissue: Secondary | ICD-10-CM | POA: Diagnosis not present

## 2014-12-08 DIAGNOSIS — M7981 Nontraumatic hematoma of soft tissue: Secondary | ICD-10-CM | POA: Diagnosis not present

## 2014-12-08 DIAGNOSIS — M6281 Muscle weakness (generalized): Secondary | ICD-10-CM | POA: Diagnosis not present

## 2014-12-08 DIAGNOSIS — R262 Difficulty in walking, not elsewhere classified: Secondary | ICD-10-CM | POA: Diagnosis not present

## 2014-12-12 DIAGNOSIS — I10 Essential (primary) hypertension: Secondary | ICD-10-CM | POA: Diagnosis not present

## 2014-12-12 DIAGNOSIS — Z6832 Body mass index (BMI) 32.0-32.9, adult: Secondary | ICD-10-CM | POA: Diagnosis not present

## 2014-12-12 DIAGNOSIS — E119 Type 2 diabetes mellitus without complications: Secondary | ICD-10-CM | POA: Diagnosis not present

## 2014-12-12 DIAGNOSIS — K5909 Other constipation: Secondary | ICD-10-CM | POA: Diagnosis not present

## 2014-12-12 DIAGNOSIS — R2689 Other abnormalities of gait and mobility: Secondary | ICD-10-CM | POA: Diagnosis not present

## 2014-12-12 DIAGNOSIS — I480 Paroxysmal atrial fibrillation: Secondary | ICD-10-CM | POA: Diagnosis not present

## 2014-12-12 DIAGNOSIS — D72829 Elevated white blood cell count, unspecified: Secondary | ICD-10-CM | POA: Diagnosis not present

## 2014-12-12 DIAGNOSIS — R600 Localized edema: Secondary | ICD-10-CM | POA: Diagnosis not present

## 2014-12-12 DIAGNOSIS — D649 Anemia, unspecified: Secondary | ICD-10-CM | POA: Diagnosis not present

## 2014-12-12 DIAGNOSIS — R627 Adult failure to thrive: Secondary | ICD-10-CM | POA: Diagnosis not present

## 2014-12-13 DIAGNOSIS — M6281 Muscle weakness (generalized): Secondary | ICD-10-CM | POA: Diagnosis not present

## 2014-12-13 DIAGNOSIS — M7981 Nontraumatic hematoma of soft tissue: Secondary | ICD-10-CM | POA: Diagnosis not present

## 2014-12-13 DIAGNOSIS — R262 Difficulty in walking, not elsewhere classified: Secondary | ICD-10-CM | POA: Diagnosis not present

## 2014-12-15 DIAGNOSIS — M6281 Muscle weakness (generalized): Secondary | ICD-10-CM | POA: Diagnosis not present

## 2014-12-15 DIAGNOSIS — M7981 Nontraumatic hematoma of soft tissue: Secondary | ICD-10-CM | POA: Diagnosis not present

## 2014-12-15 DIAGNOSIS — R262 Difficulty in walking, not elsewhere classified: Secondary | ICD-10-CM | POA: Diagnosis not present

## 2014-12-16 DIAGNOSIS — M7981 Nontraumatic hematoma of soft tissue: Secondary | ICD-10-CM | POA: Diagnosis not present

## 2014-12-16 DIAGNOSIS — R262 Difficulty in walking, not elsewhere classified: Secondary | ICD-10-CM | POA: Diagnosis not present

## 2014-12-16 DIAGNOSIS — M6281 Muscle weakness (generalized): Secondary | ICD-10-CM | POA: Diagnosis not present

## 2014-12-20 DIAGNOSIS — M6281 Muscle weakness (generalized): Secondary | ICD-10-CM | POA: Diagnosis not present

## 2014-12-20 DIAGNOSIS — M7981 Nontraumatic hematoma of soft tissue: Secondary | ICD-10-CM | POA: Diagnosis not present

## 2014-12-20 DIAGNOSIS — R262 Difficulty in walking, not elsewhere classified: Secondary | ICD-10-CM | POA: Diagnosis not present

## 2014-12-22 DIAGNOSIS — R262 Difficulty in walking, not elsewhere classified: Secondary | ICD-10-CM | POA: Diagnosis not present

## 2014-12-22 DIAGNOSIS — M6281 Muscle weakness (generalized): Secondary | ICD-10-CM | POA: Diagnosis not present

## 2014-12-22 DIAGNOSIS — M7981 Nontraumatic hematoma of soft tissue: Secondary | ICD-10-CM | POA: Diagnosis not present

## 2014-12-26 DIAGNOSIS — M6281 Muscle weakness (generalized): Secondary | ICD-10-CM | POA: Diagnosis not present

## 2014-12-26 DIAGNOSIS — M7981 Nontraumatic hematoma of soft tissue: Secondary | ICD-10-CM | POA: Diagnosis not present

## 2014-12-26 DIAGNOSIS — R262 Difficulty in walking, not elsewhere classified: Secondary | ICD-10-CM | POA: Diagnosis not present

## 2014-12-28 DIAGNOSIS — R262 Difficulty in walking, not elsewhere classified: Secondary | ICD-10-CM | POA: Diagnosis not present

## 2014-12-28 DIAGNOSIS — M6281 Muscle weakness (generalized): Secondary | ICD-10-CM | POA: Diagnosis not present

## 2014-12-28 DIAGNOSIS — M7981 Nontraumatic hematoma of soft tissue: Secondary | ICD-10-CM | POA: Diagnosis not present

## 2014-12-29 DIAGNOSIS — Z9889 Other specified postprocedural states: Secondary | ICD-10-CM | POA: Diagnosis not present

## 2014-12-29 DIAGNOSIS — Z9841 Cataract extraction status, right eye: Secondary | ICD-10-CM | POA: Diagnosis not present

## 2014-12-29 DIAGNOSIS — H1820 Unspecified corneal edema: Secondary | ICD-10-CM | POA: Diagnosis not present

## 2015-01-02 DIAGNOSIS — M7981 Nontraumatic hematoma of soft tissue: Secondary | ICD-10-CM | POA: Diagnosis not present

## 2015-01-02 DIAGNOSIS — M6281 Muscle weakness (generalized): Secondary | ICD-10-CM | POA: Diagnosis not present

## 2015-01-02 DIAGNOSIS — R262 Difficulty in walking, not elsewhere classified: Secondary | ICD-10-CM | POA: Diagnosis not present

## 2015-01-03 DIAGNOSIS — M25551 Pain in right hip: Secondary | ICD-10-CM | POA: Diagnosis not present

## 2015-01-03 DIAGNOSIS — R262 Difficulty in walking, not elsewhere classified: Secondary | ICD-10-CM | POA: Diagnosis not present

## 2015-01-04 DIAGNOSIS — R262 Difficulty in walking, not elsewhere classified: Secondary | ICD-10-CM | POA: Diagnosis not present

## 2015-01-04 DIAGNOSIS — M25551 Pain in right hip: Secondary | ICD-10-CM | POA: Diagnosis not present

## 2015-01-05 DIAGNOSIS — R262 Difficulty in walking, not elsewhere classified: Secondary | ICD-10-CM | POA: Diagnosis not present

## 2015-01-05 DIAGNOSIS — M25551 Pain in right hip: Secondary | ICD-10-CM | POA: Diagnosis not present

## 2015-01-09 DIAGNOSIS — M25551 Pain in right hip: Secondary | ICD-10-CM | POA: Diagnosis not present

## 2015-01-09 DIAGNOSIS — R262 Difficulty in walking, not elsewhere classified: Secondary | ICD-10-CM | POA: Diagnosis not present

## 2015-01-11 DIAGNOSIS — M25551 Pain in right hip: Secondary | ICD-10-CM | POA: Diagnosis not present

## 2015-01-11 DIAGNOSIS — R262 Difficulty in walking, not elsewhere classified: Secondary | ICD-10-CM | POA: Diagnosis not present

## 2015-01-13 DIAGNOSIS — R262 Difficulty in walking, not elsewhere classified: Secondary | ICD-10-CM | POA: Diagnosis not present

## 2015-01-13 DIAGNOSIS — M25551 Pain in right hip: Secondary | ICD-10-CM | POA: Diagnosis not present

## 2015-01-16 DIAGNOSIS — R600 Localized edema: Secondary | ICD-10-CM | POA: Diagnosis not present

## 2015-01-16 DIAGNOSIS — Z6833 Body mass index (BMI) 33.0-33.9, adult: Secondary | ICD-10-CM | POA: Diagnosis not present

## 2015-01-16 DIAGNOSIS — N3281 Overactive bladder: Secondary | ICD-10-CM | POA: Diagnosis not present

## 2015-01-16 DIAGNOSIS — I480 Paroxysmal atrial fibrillation: Secondary | ICD-10-CM | POA: Diagnosis not present

## 2015-01-16 DIAGNOSIS — E119 Type 2 diabetes mellitus without complications: Secondary | ICD-10-CM | POA: Diagnosis not present

## 2015-01-16 DIAGNOSIS — D72829 Elevated white blood cell count, unspecified: Secondary | ICD-10-CM | POA: Diagnosis not present

## 2015-01-16 DIAGNOSIS — R627 Adult failure to thrive: Secondary | ICD-10-CM | POA: Diagnosis not present

## 2015-01-16 DIAGNOSIS — K5909 Other constipation: Secondary | ICD-10-CM | POA: Diagnosis not present

## 2015-01-16 DIAGNOSIS — M21371 Foot drop, right foot: Secondary | ICD-10-CM | POA: Diagnosis not present

## 2015-01-17 DIAGNOSIS — R262 Difficulty in walking, not elsewhere classified: Secondary | ICD-10-CM | POA: Diagnosis not present

## 2015-01-17 DIAGNOSIS — M25551 Pain in right hip: Secondary | ICD-10-CM | POA: Diagnosis not present

## 2015-01-18 DIAGNOSIS — R262 Difficulty in walking, not elsewhere classified: Secondary | ICD-10-CM | POA: Diagnosis not present

## 2015-01-18 DIAGNOSIS — M25551 Pain in right hip: Secondary | ICD-10-CM | POA: Diagnosis not present

## 2015-01-19 DIAGNOSIS — T8522XD Displacement of intraocular lens, subsequent encounter: Secondary | ICD-10-CM | POA: Diagnosis not present

## 2015-01-19 DIAGNOSIS — H3531 Nonexudative age-related macular degeneration: Secondary | ICD-10-CM | POA: Diagnosis not present

## 2015-01-19 DIAGNOSIS — H1820 Unspecified corneal edema: Secondary | ICD-10-CM | POA: Diagnosis not present

## 2015-01-19 DIAGNOSIS — C6901 Malignant neoplasm of right conjunctiva: Secondary | ICD-10-CM | POA: Diagnosis not present

## 2015-01-19 DIAGNOSIS — H35351 Cystoid macular degeneration, right eye: Secondary | ICD-10-CM | POA: Diagnosis not present

## 2015-01-20 DIAGNOSIS — R262 Difficulty in walking, not elsewhere classified: Secondary | ICD-10-CM | POA: Diagnosis not present

## 2015-01-20 DIAGNOSIS — M25551 Pain in right hip: Secondary | ICD-10-CM | POA: Diagnosis not present

## 2015-01-23 DIAGNOSIS — R262 Difficulty in walking, not elsewhere classified: Secondary | ICD-10-CM | POA: Diagnosis not present

## 2015-01-23 DIAGNOSIS — M25551 Pain in right hip: Secondary | ICD-10-CM | POA: Diagnosis not present

## 2015-01-25 DIAGNOSIS — R262 Difficulty in walking, not elsewhere classified: Secondary | ICD-10-CM | POA: Diagnosis not present

## 2015-01-25 DIAGNOSIS — M25551 Pain in right hip: Secondary | ICD-10-CM | POA: Diagnosis not present

## 2015-01-26 DIAGNOSIS — Z85820 Personal history of malignant melanoma of skin: Secondary | ICD-10-CM | POA: Diagnosis not present

## 2015-01-26 DIAGNOSIS — B351 Tinea unguium: Secondary | ICD-10-CM | POA: Diagnosis not present

## 2015-01-26 DIAGNOSIS — D225 Melanocytic nevi of trunk: Secondary | ICD-10-CM | POA: Diagnosis not present

## 2015-01-26 DIAGNOSIS — L821 Other seborrheic keratosis: Secondary | ICD-10-CM | POA: Diagnosis not present

## 2015-01-26 DIAGNOSIS — L570 Actinic keratosis: Secondary | ICD-10-CM | POA: Diagnosis not present

## 2015-01-26 DIAGNOSIS — Z85828 Personal history of other malignant neoplasm of skin: Secondary | ICD-10-CM | POA: Diagnosis not present

## 2015-01-26 DIAGNOSIS — D2262 Melanocytic nevi of left upper limb, including shoulder: Secondary | ICD-10-CM | POA: Diagnosis not present

## 2015-01-27 DIAGNOSIS — R262 Difficulty in walking, not elsewhere classified: Secondary | ICD-10-CM | POA: Diagnosis not present

## 2015-01-27 DIAGNOSIS — M25551 Pain in right hip: Secondary | ICD-10-CM | POA: Diagnosis not present

## 2015-01-30 DIAGNOSIS — R262 Difficulty in walking, not elsewhere classified: Secondary | ICD-10-CM | POA: Diagnosis not present

## 2015-01-30 DIAGNOSIS — M25551 Pain in right hip: Secondary | ICD-10-CM | POA: Diagnosis not present

## 2015-02-01 DIAGNOSIS — R262 Difficulty in walking, not elsewhere classified: Secondary | ICD-10-CM | POA: Diagnosis not present

## 2015-02-01 DIAGNOSIS — M25551 Pain in right hip: Secondary | ICD-10-CM | POA: Diagnosis not present

## 2015-02-06 DIAGNOSIS — M6281 Muscle weakness (generalized): Secondary | ICD-10-CM | POA: Diagnosis not present

## 2015-02-08 DIAGNOSIS — M6281 Muscle weakness (generalized): Secondary | ICD-10-CM | POA: Diagnosis not present

## 2015-02-10 DIAGNOSIS — M6281 Muscle weakness (generalized): Secondary | ICD-10-CM | POA: Diagnosis not present

## 2015-02-15 DIAGNOSIS — H1820 Unspecified corneal edema: Secondary | ICD-10-CM | POA: Diagnosis not present

## 2015-02-28 DIAGNOSIS — K5909 Other constipation: Secondary | ICD-10-CM | POA: Diagnosis not present

## 2015-02-28 DIAGNOSIS — D649 Anemia, unspecified: Secondary | ICD-10-CM | POA: Diagnosis not present

## 2015-02-28 DIAGNOSIS — E119 Type 2 diabetes mellitus without complications: Secondary | ICD-10-CM | POA: Diagnosis not present

## 2015-02-28 DIAGNOSIS — I10 Essential (primary) hypertension: Secondary | ICD-10-CM | POA: Diagnosis not present

## 2015-02-28 DIAGNOSIS — R627 Adult failure to thrive: Secondary | ICD-10-CM | POA: Diagnosis not present

## 2015-02-28 DIAGNOSIS — R7302 Impaired glucose tolerance (oral): Secondary | ICD-10-CM | POA: Diagnosis not present

## 2015-02-28 DIAGNOSIS — D72829 Elevated white blood cell count, unspecified: Secondary | ICD-10-CM | POA: Diagnosis not present

## 2015-02-28 DIAGNOSIS — Z6832 Body mass index (BMI) 32.0-32.9, adult: Secondary | ICD-10-CM | POA: Diagnosis not present

## 2015-02-28 DIAGNOSIS — D692 Other nonthrombocytopenic purpura: Secondary | ICD-10-CM | POA: Diagnosis not present

## 2015-03-23 DIAGNOSIS — T8522XD Displacement of intraocular lens, subsequent encounter: Secondary | ICD-10-CM | POA: Diagnosis not present

## 2015-03-23 DIAGNOSIS — H119 Unspecified disorder of conjunctiva: Secondary | ICD-10-CM | POA: Diagnosis not present

## 2015-03-23 DIAGNOSIS — H35351 Cystoid macular degeneration, right eye: Secondary | ICD-10-CM | POA: Diagnosis not present

## 2015-03-23 DIAGNOSIS — H3531 Nonexudative age-related macular degeneration: Secondary | ICD-10-CM | POA: Diagnosis not present

## 2015-03-23 DIAGNOSIS — C6901 Malignant neoplasm of right conjunctiva: Secondary | ICD-10-CM | POA: Diagnosis not present

## 2015-03-27 IMAGING — CR DG CHEST 1V PORT
1 series · 1 of 1 positions shown · non-contrast
Comparison: None.

CLINICAL DATA: Acute onset of atrial fibrillation. Initial
encounter.

EXAM:
PORTABLE CHEST - 1 VIEW

[AP]
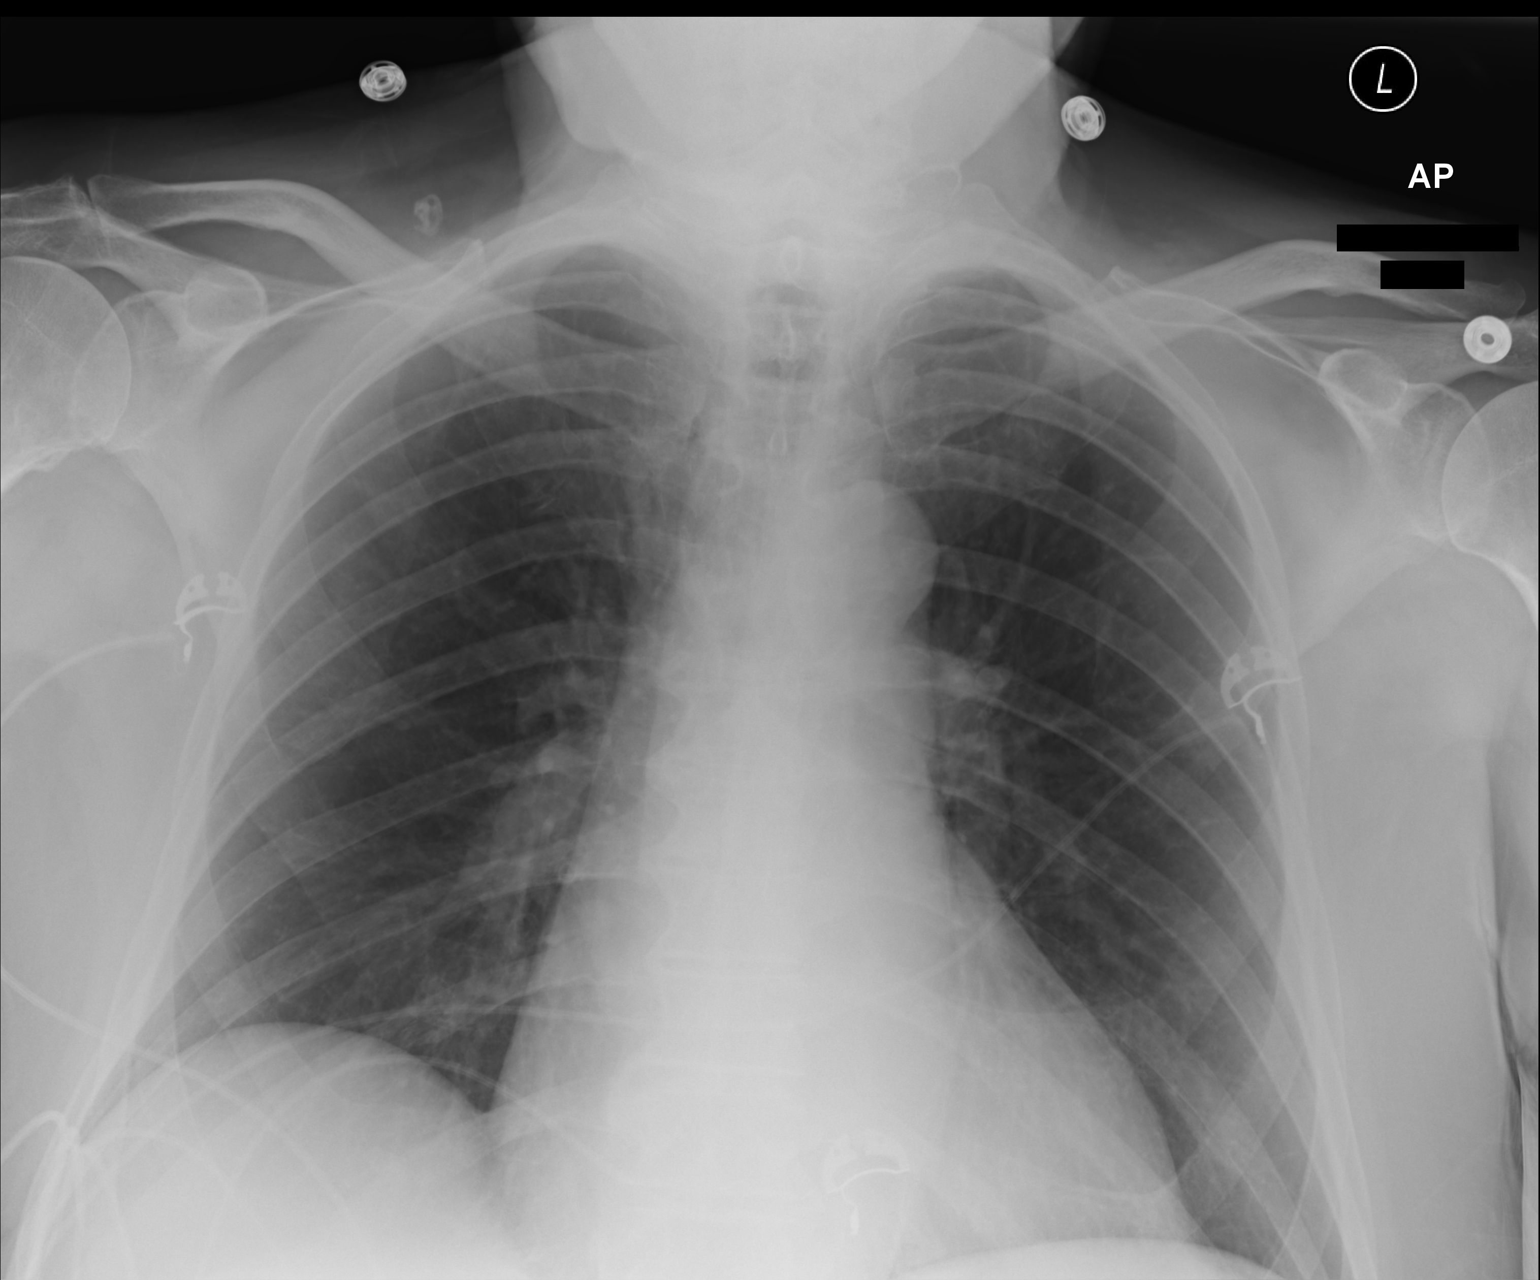

[1 of 1 positions shown; findings below may reference images not displayed]

FINDINGS: The lungs are well-aerated and clear. There is no evidence of focal
opacification, pleural effusion or pneumothorax. The left
costophrenic angle is incompletely imaged on this study.

The cardiomediastinal silhouette is borderline normal in size. No
acute osseous abnormalities are seen.
IMPRESSION: No acute cardiopulmonary process seen.

## 2015-04-14 DIAGNOSIS — H1851 Endothelial corneal dystrophy: Secondary | ICD-10-CM | POA: Diagnosis not present

## 2015-04-14 DIAGNOSIS — H02834 Dermatochalasis of left upper eyelid: Secondary | ICD-10-CM | POA: Diagnosis not present

## 2015-04-14 DIAGNOSIS — Z961 Presence of intraocular lens: Secondary | ICD-10-CM | POA: Diagnosis not present

## 2015-04-14 DIAGNOSIS — H02831 Dermatochalasis of right upper eyelid: Secondary | ICD-10-CM | POA: Diagnosis not present

## 2015-04-14 DIAGNOSIS — H40011 Open angle with borderline findings, low risk, right eye: Secondary | ICD-10-CM | POA: Diagnosis not present

## 2015-04-18 DIAGNOSIS — R627 Adult failure to thrive: Secondary | ICD-10-CM | POA: Diagnosis not present

## 2015-04-18 DIAGNOSIS — I480 Paroxysmal atrial fibrillation: Secondary | ICD-10-CM | POA: Diagnosis not present

## 2015-04-18 DIAGNOSIS — Z7901 Long term (current) use of anticoagulants: Secondary | ICD-10-CM | POA: Diagnosis not present

## 2015-04-18 DIAGNOSIS — I10 Essential (primary) hypertension: Secondary | ICD-10-CM | POA: Diagnosis not present

## 2015-04-18 DIAGNOSIS — E119 Type 2 diabetes mellitus without complications: Secondary | ICD-10-CM | POA: Diagnosis not present

## 2015-04-18 DIAGNOSIS — K5909 Other constipation: Secondary | ICD-10-CM | POA: Diagnosis not present

## 2015-04-18 DIAGNOSIS — D692 Other nonthrombocytopenic purpura: Secondary | ICD-10-CM | POA: Diagnosis not present

## 2015-04-18 DIAGNOSIS — D649 Anemia, unspecified: Secondary | ICD-10-CM | POA: Diagnosis not present

## 2015-04-18 DIAGNOSIS — R600 Localized edema: Secondary | ICD-10-CM | POA: Diagnosis not present

## 2015-04-18 DIAGNOSIS — Z6831 Body mass index (BMI) 31.0-31.9, adult: Secondary | ICD-10-CM | POA: Diagnosis not present

## 2015-04-18 DIAGNOSIS — D72829 Elevated white blood cell count, unspecified: Secondary | ICD-10-CM | POA: Diagnosis not present

## 2015-04-24 DIAGNOSIS — Z85840 Personal history of malignant neoplasm of eye: Secondary | ICD-10-CM | POA: Diagnosis not present

## 2015-04-24 DIAGNOSIS — Z87891 Personal history of nicotine dependence: Secondary | ICD-10-CM | POA: Diagnosis not present

## 2015-04-24 DIAGNOSIS — Z85820 Personal history of malignant melanoma of skin: Secondary | ICD-10-CM | POA: Diagnosis not present

## 2015-04-24 DIAGNOSIS — Z79899 Other long term (current) drug therapy: Secondary | ICD-10-CM | POA: Diagnosis not present

## 2015-04-24 DIAGNOSIS — I10 Essential (primary) hypertension: Secondary | ICD-10-CM | POA: Diagnosis not present

## 2015-04-24 DIAGNOSIS — Z08 Encounter for follow-up examination after completed treatment for malignant neoplasm: Secondary | ICD-10-CM | POA: Diagnosis not present

## 2015-04-24 DIAGNOSIS — Z9889 Other specified postprocedural states: Secondary | ICD-10-CM | POA: Diagnosis not present

## 2015-06-05 DIAGNOSIS — I480 Paroxysmal atrial fibrillation: Secondary | ICD-10-CM | POA: Diagnosis not present

## 2015-06-05 DIAGNOSIS — J90 Pleural effusion, not elsewhere classified: Secondary | ICD-10-CM | POA: Diagnosis not present

## 2015-06-05 DIAGNOSIS — R0602 Shortness of breath: Secondary | ICD-10-CM | POA: Diagnosis not present

## 2015-06-05 DIAGNOSIS — Z6831 Body mass index (BMI) 31.0-31.9, adult: Secondary | ICD-10-CM | POA: Diagnosis not present

## 2015-06-08 ENCOUNTER — Other Ambulatory Visit (HOSPITAL_COMMUNITY): Payer: Self-pay | Admitting: Internal Medicine

## 2015-06-08 ENCOUNTER — Ambulatory Visit (HOSPITAL_COMMUNITY)
Admission: RE | Admit: 2015-06-08 | Discharge: 2015-06-08 | Disposition: A | Discharge status: Home / Self Care | Payer: Medicare Other | Source: Ambulatory Visit | Attending: Radiology | Admitting: Radiology

## 2015-06-08 ENCOUNTER — Ambulatory Visit (HOSPITAL_COMMUNITY)
Admission: RE | Admit: 2015-06-08 | Discharge: 2015-06-08 | Disposition: A | Discharge status: Home / Self Care | Payer: Medicare Other | Source: Ambulatory Visit | Attending: Internal Medicine | Admitting: Internal Medicine

## 2015-06-08 DIAGNOSIS — J90 Pleural effusion, not elsewhere classified: Secondary | ICD-10-CM

## 2015-06-08 DIAGNOSIS — J948 Other specified pleural conditions: Secondary | ICD-10-CM | POA: Diagnosis not present

## 2015-06-08 DIAGNOSIS — R0602 Shortness of breath: Secondary | ICD-10-CM | POA: Diagnosis not present

## 2015-06-08 DIAGNOSIS — I517 Cardiomegaly: Secondary | ICD-10-CM | POA: Insufficient documentation

## 2015-06-08 DIAGNOSIS — J9811 Atelectasis: Secondary | ICD-10-CM | POA: Diagnosis not present

## 2015-06-08 LAB — BODY FLUID CELL COUNT WITH DIFFERENTIAL
Lymphs, Fluid: 70 %
Monocyte-Macrophage-Serous Fluid: 6 % — ABNORMAL LOW (ref 50–90)
Neutrophil Count, Fluid: 24 % (ref 0–25)
Total Nucleated Cell Count, Fluid: 535 cu mm (ref 0–1000)

## 2015-06-08 LAB — GRAM STAIN

## 2015-06-08 LAB — ALBUMIN, FLUID (OTHER): Albumin, Fluid: 2.3 g/dL

## 2015-06-08 LAB — LACTATE DEHYDROGENASE, PLEURAL OR PERITONEAL FLUID: LD, Fluid: 157 U/L — ABNORMAL HIGH (ref 3–23)

## 2015-06-08 MED ORDER — LIDOCAINE HCL (PF) 1 % IJ SOLN
INTRAMUSCULAR | Status: AC
  Filled 2015-06-08: qty 10

## 2015-06-08 NOTE — Procedures (Signed)
Successful US guided left thoracentesis. Yielded 1.4L of clear yellow fluid. Pt tolerated procedure well. No immediate complications.  Specimen was sent for labs. CXR ordered.  Ascencion Dike PA-C 06/08/2015 2:24 PM

## 2015-06-12 DIAGNOSIS — J90 Pleural effusion, not elsewhere classified: Secondary | ICD-10-CM | POA: Diagnosis not present

## 2015-06-13 LAB — CULTURE, BODY FLUID W GRAM STAIN -BOTTLE: Culture: NO GROWTH

## 2015-06-13 LAB — CULTURE, BODY FLUID-BOTTLE

## 2015-06-15 ENCOUNTER — Other Ambulatory Visit (HOSPITAL_COMMUNITY): Payer: Self-pay | Admitting: Internal Medicine

## 2015-06-15 DIAGNOSIS — R0602 Shortness of breath: Secondary | ICD-10-CM

## 2015-06-19 ENCOUNTER — Ambulatory Visit (HOSPITAL_COMMUNITY): Payer: Medicare Other | Attending: Cardiology

## 2015-06-19 ENCOUNTER — Other Ambulatory Visit: Payer: Self-pay

## 2015-06-19 DIAGNOSIS — Z87891 Personal history of nicotine dependence: Secondary | ICD-10-CM | POA: Diagnosis not present

## 2015-06-19 DIAGNOSIS — J90 Pleural effusion, not elsewhere classified: Secondary | ICD-10-CM | POA: Insufficient documentation

## 2015-06-19 DIAGNOSIS — I517 Cardiomegaly: Secondary | ICD-10-CM | POA: Diagnosis not present

## 2015-06-19 DIAGNOSIS — Z8249 Family history of ischemic heart disease and other diseases of the circulatory system: Secondary | ICD-10-CM | POA: Insufficient documentation

## 2015-06-19 DIAGNOSIS — E119 Type 2 diabetes mellitus without complications: Secondary | ICD-10-CM | POA: Diagnosis not present

## 2015-06-19 DIAGNOSIS — I4891 Unspecified atrial fibrillation: Secondary | ICD-10-CM | POA: Insufficient documentation

## 2015-06-19 DIAGNOSIS — I313 Pericardial effusion (noninflammatory): Secondary | ICD-10-CM | POA: Diagnosis not present

## 2015-06-19 DIAGNOSIS — I351 Nonrheumatic aortic (valve) insufficiency: Secondary | ICD-10-CM | POA: Insufficient documentation

## 2015-06-19 DIAGNOSIS — R0602 Shortness of breath: Secondary | ICD-10-CM

## 2015-06-19 DIAGNOSIS — I10 Essential (primary) hypertension: Secondary | ICD-10-CM | POA: Insufficient documentation

## 2015-06-20 DIAGNOSIS — J90 Pleural effusion, not elsewhere classified: Secondary | ICD-10-CM | POA: Diagnosis not present

## 2015-06-29 DIAGNOSIS — J90 Pleural effusion, not elsewhere classified: Secondary | ICD-10-CM | POA: Diagnosis not present

## 2015-06-29 DIAGNOSIS — I272 Other secondary pulmonary hypertension: Secondary | ICD-10-CM | POA: Diagnosis not present

## 2015-06-29 DIAGNOSIS — R7302 Impaired glucose tolerance (oral): Secondary | ICD-10-CM | POA: Diagnosis not present

## 2015-06-29 DIAGNOSIS — I5020 Unspecified systolic (congestive) heart failure: Secondary | ICD-10-CM | POA: Diagnosis not present

## 2015-06-29 DIAGNOSIS — Z6829 Body mass index (BMI) 29.0-29.9, adult: Secondary | ICD-10-CM | POA: Diagnosis not present

## 2015-06-29 DIAGNOSIS — R0602 Shortness of breath: Secondary | ICD-10-CM | POA: Diagnosis not present

## 2015-07-18 DIAGNOSIS — I480 Paroxysmal atrial fibrillation: Secondary | ICD-10-CM | POA: Diagnosis not present

## 2015-07-18 DIAGNOSIS — I5020 Unspecified systolic (congestive) heart failure: Secondary | ICD-10-CM | POA: Diagnosis not present

## 2015-07-18 DIAGNOSIS — R627 Adult failure to thrive: Secondary | ICD-10-CM | POA: Diagnosis not present

## 2015-07-18 DIAGNOSIS — Z23 Encounter for immunization: Secondary | ICD-10-CM | POA: Diagnosis not present

## 2015-07-18 DIAGNOSIS — E119 Type 2 diabetes mellitus without complications: Secondary | ICD-10-CM | POA: Diagnosis not present

## 2015-07-18 DIAGNOSIS — Z7901 Long term (current) use of anticoagulants: Secondary | ICD-10-CM | POA: Diagnosis not present

## 2015-07-18 DIAGNOSIS — I10 Essential (primary) hypertension: Secondary | ICD-10-CM | POA: Diagnosis not present

## 2015-07-18 DIAGNOSIS — I272 Other secondary pulmonary hypertension: Secondary | ICD-10-CM | POA: Diagnosis not present

## 2015-07-18 DIAGNOSIS — N3281 Overactive bladder: Secondary | ICD-10-CM | POA: Diagnosis not present

## 2015-07-18 DIAGNOSIS — Z6829 Body mass index (BMI) 29.0-29.9, adult: Secondary | ICD-10-CM | POA: Diagnosis not present

## 2015-08-01 DIAGNOSIS — L821 Other seborrheic keratosis: Secondary | ICD-10-CM | POA: Diagnosis not present

## 2015-08-01 DIAGNOSIS — Z85820 Personal history of malignant melanoma of skin: Secondary | ICD-10-CM | POA: Diagnosis not present

## 2015-08-01 DIAGNOSIS — L570 Actinic keratosis: Secondary | ICD-10-CM | POA: Diagnosis not present

## 2015-08-01 DIAGNOSIS — D225 Melanocytic nevi of trunk: Secondary | ICD-10-CM | POA: Diagnosis not present

## 2015-08-01 DIAGNOSIS — Z85828 Personal history of other malignant neoplasm of skin: Secondary | ICD-10-CM | POA: Diagnosis not present

## 2015-08-01 DIAGNOSIS — C44229 Squamous cell carcinoma of skin of left ear and external auricular canal: Secondary | ICD-10-CM | POA: Diagnosis not present

## 2015-08-01 DIAGNOSIS — D485 Neoplasm of uncertain behavior of skin: Secondary | ICD-10-CM | POA: Diagnosis not present

## 2015-08-01 DIAGNOSIS — D692 Other nonthrombocytopenic purpura: Secondary | ICD-10-CM | POA: Diagnosis not present

## 2015-08-01 DIAGNOSIS — D1801 Hemangioma of skin and subcutaneous tissue: Secondary | ICD-10-CM | POA: Diagnosis not present

## 2015-08-31 DIAGNOSIS — Z125 Encounter for screening for malignant neoplasm of prostate: Secondary | ICD-10-CM | POA: Diagnosis not present

## 2015-08-31 DIAGNOSIS — I10 Essential (primary) hypertension: Secondary | ICD-10-CM | POA: Diagnosis not present

## 2015-08-31 DIAGNOSIS — I272 Other secondary pulmonary hypertension: Secondary | ICD-10-CM | POA: Diagnosis not present

## 2015-08-31 DIAGNOSIS — I5020 Unspecified systolic (congestive) heart failure: Secondary | ICD-10-CM | POA: Diagnosis not present

## 2015-08-31 DIAGNOSIS — E119 Type 2 diabetes mellitus without complications: Secondary | ICD-10-CM | POA: Diagnosis not present

## 2015-09-07 DIAGNOSIS — E876 Hypokalemia: Secondary | ICD-10-CM | POA: Diagnosis not present

## 2015-09-07 DIAGNOSIS — D72829 Elevated white blood cell count, unspecified: Secondary | ICD-10-CM | POA: Diagnosis not present

## 2015-09-07 DIAGNOSIS — Z6829 Body mass index (BMI) 29.0-29.9, adult: Secondary | ICD-10-CM | POA: Diagnosis not present

## 2015-09-07 DIAGNOSIS — I272 Other secondary pulmonary hypertension: Secondary | ICD-10-CM | POA: Diagnosis not present

## 2015-09-07 DIAGNOSIS — I10 Essential (primary) hypertension: Secondary | ICD-10-CM | POA: Diagnosis not present

## 2015-09-07 DIAGNOSIS — C439 Malignant melanoma of skin, unspecified: Secondary | ICD-10-CM | POA: Diagnosis not present

## 2015-09-07 DIAGNOSIS — M21371 Foot drop, right foot: Secondary | ICD-10-CM | POA: Diagnosis not present

## 2015-09-07 DIAGNOSIS — R627 Adult failure to thrive: Secondary | ICD-10-CM | POA: Diagnosis not present

## 2015-09-07 DIAGNOSIS — I5020 Unspecified systolic (congestive) heart failure: Secondary | ICD-10-CM | POA: Diagnosis not present

## 2015-09-07 DIAGNOSIS — Z0000 Encounter for general adult medical examination without abnormal findings: Secondary | ICD-10-CM | POA: Diagnosis not present

## 2015-09-07 DIAGNOSIS — D473 Essential (hemorrhagic) thrombocythemia: Secondary | ICD-10-CM | POA: Diagnosis not present

## 2015-09-07 DIAGNOSIS — R600 Localized edema: Secondary | ICD-10-CM | POA: Diagnosis not present

## 2015-09-11 DIAGNOSIS — Z23 Encounter for immunization: Secondary | ICD-10-CM | POA: Diagnosis not present

## 2015-10-17 DIAGNOSIS — H40053 Ocular hypertension, bilateral: Secondary | ICD-10-CM | POA: Diagnosis not present

## 2015-10-17 DIAGNOSIS — Z961 Presence of intraocular lens: Secondary | ICD-10-CM | POA: Diagnosis not present

## 2015-10-17 DIAGNOSIS — H02831 Dermatochalasis of right upper eyelid: Secondary | ICD-10-CM | POA: Diagnosis not present

## 2015-10-17 DIAGNOSIS — H1851 Endothelial corneal dystrophy: Secondary | ICD-10-CM | POA: Diagnosis not present

## 2015-10-17 DIAGNOSIS — H02834 Dermatochalasis of left upper eyelid: Secondary | ICD-10-CM | POA: Diagnosis not present

## 2015-10-18 DIAGNOSIS — C6901 Malignant neoplasm of right conjunctiva: Secondary | ICD-10-CM | POA: Diagnosis not present

## 2015-10-18 DIAGNOSIS — H1820 Unspecified corneal edema: Secondary | ICD-10-CM | POA: Diagnosis not present

## 2015-11-07 DIAGNOSIS — Z9889 Other specified postprocedural states: Secondary | ICD-10-CM | POA: Diagnosis not present

## 2015-11-07 DIAGNOSIS — Z08 Encounter for follow-up examination after completed treatment for malignant neoplasm: Secondary | ICD-10-CM | POA: Diagnosis not present

## 2015-11-07 DIAGNOSIS — H02002 Unspecified entropion of right lower eyelid: Secondary | ICD-10-CM | POA: Diagnosis not present

## 2015-11-07 DIAGNOSIS — Z85840 Personal history of malignant neoplasm of eye: Secondary | ICD-10-CM | POA: Diagnosis not present

## 2015-11-07 DIAGNOSIS — Z961 Presence of intraocular lens: Secondary | ICD-10-CM | POA: Diagnosis not present

## 2015-11-07 DIAGNOSIS — L853 Xerosis cutis: Secondary | ICD-10-CM | POA: Diagnosis not present

## 2015-11-07 DIAGNOSIS — C6901 Malignant neoplasm of right conjunctiva: Secondary | ICD-10-CM | POA: Diagnosis not present

## 2015-11-07 DIAGNOSIS — Z87891 Personal history of nicotine dependence: Secondary | ICD-10-CM | POA: Diagnosis not present

## 2015-11-07 DIAGNOSIS — Z79899 Other long term (current) drug therapy: Secondary | ICD-10-CM | POA: Diagnosis not present

## 2015-11-07 DIAGNOSIS — I10 Essential (primary) hypertension: Secondary | ICD-10-CM | POA: Diagnosis not present

## 2015-11-07 DIAGNOSIS — H02005 Unspecified entropion of left lower eyelid: Secondary | ICD-10-CM | POA: Diagnosis not present

## 2016-01-08 DIAGNOSIS — Z6830 Body mass index (BMI) 30.0-30.9, adult: Secondary | ICD-10-CM | POA: Diagnosis not present

## 2016-01-08 DIAGNOSIS — M21371 Foot drop, right foot: Secondary | ICD-10-CM | POA: Diagnosis not present

## 2016-01-08 DIAGNOSIS — I5020 Unspecified systolic (congestive) heart failure: Secondary | ICD-10-CM | POA: Diagnosis not present

## 2016-01-08 DIAGNOSIS — R627 Adult failure to thrive: Secondary | ICD-10-CM | POA: Diagnosis not present

## 2016-01-08 DIAGNOSIS — I272 Other secondary pulmonary hypertension: Secondary | ICD-10-CM | POA: Diagnosis not present

## 2016-01-08 DIAGNOSIS — E119 Type 2 diabetes mellitus without complications: Secondary | ICD-10-CM | POA: Diagnosis not present

## 2016-01-08 DIAGNOSIS — I10 Essential (primary) hypertension: Secondary | ICD-10-CM | POA: Diagnosis not present

## 2016-01-08 DIAGNOSIS — I480 Paroxysmal atrial fibrillation: Secondary | ICD-10-CM | POA: Diagnosis not present

## 2016-01-08 DIAGNOSIS — D692 Other nonthrombocytopenic purpura: Secondary | ICD-10-CM | POA: Diagnosis not present

## 2016-01-08 DIAGNOSIS — Z1389 Encounter for screening for other disorder: Secondary | ICD-10-CM | POA: Diagnosis not present

## 2016-02-05 DIAGNOSIS — D2372 Other benign neoplasm of skin of left lower limb, including hip: Secondary | ICD-10-CM | POA: Diagnosis not present

## 2016-02-05 DIAGNOSIS — Z85828 Personal history of other malignant neoplasm of skin: Secondary | ICD-10-CM | POA: Diagnosis not present

## 2016-02-05 DIAGNOSIS — L820 Inflamed seborrheic keratosis: Secondary | ICD-10-CM | POA: Diagnosis not present

## 2016-02-05 DIAGNOSIS — D692 Other nonthrombocytopenic purpura: Secondary | ICD-10-CM | POA: Diagnosis not present

## 2016-02-05 DIAGNOSIS — D485 Neoplasm of uncertain behavior of skin: Secondary | ICD-10-CM | POA: Diagnosis not present

## 2016-02-05 DIAGNOSIS — Z85820 Personal history of malignant melanoma of skin: Secondary | ICD-10-CM | POA: Diagnosis not present

## 2016-02-05 DIAGNOSIS — C44319 Basal cell carcinoma of skin of other parts of face: Secondary | ICD-10-CM | POA: Diagnosis not present

## 2016-02-05 DIAGNOSIS — L570 Actinic keratosis: Secondary | ICD-10-CM | POA: Diagnosis not present

## 2016-02-05 DIAGNOSIS — C44329 Squamous cell carcinoma of skin of other parts of face: Secondary | ICD-10-CM | POA: Diagnosis not present

## 2016-02-05 DIAGNOSIS — D225 Melanocytic nevi of trunk: Secondary | ICD-10-CM | POA: Diagnosis not present

## 2016-02-05 DIAGNOSIS — L821 Other seborrheic keratosis: Secondary | ICD-10-CM | POA: Diagnosis not present

## 2016-04-23 DIAGNOSIS — H01024 Squamous blepharitis left upper eyelid: Secondary | ICD-10-CM | POA: Diagnosis not present

## 2016-04-23 DIAGNOSIS — C6901 Malignant neoplasm of right conjunctiva: Secondary | ICD-10-CM | POA: Diagnosis not present

## 2016-04-23 DIAGNOSIS — H01021 Squamous blepharitis right upper eyelid: Secondary | ICD-10-CM | POA: Diagnosis not present

## 2016-04-23 DIAGNOSIS — H01022 Squamous blepharitis right lower eyelid: Secondary | ICD-10-CM | POA: Diagnosis not present

## 2016-04-23 DIAGNOSIS — H01025 Squamous blepharitis left lower eyelid: Secondary | ICD-10-CM | POA: Diagnosis not present

## 2016-04-23 DIAGNOSIS — Z961 Presence of intraocular lens: Secondary | ICD-10-CM | POA: Diagnosis not present

## 2016-04-23 DIAGNOSIS — H40053 Ocular hypertension, bilateral: Secondary | ICD-10-CM | POA: Diagnosis not present

## 2016-05-13 DIAGNOSIS — E669 Obesity, unspecified: Secondary | ICD-10-CM | POA: Diagnosis not present

## 2016-05-13 DIAGNOSIS — I480 Paroxysmal atrial fibrillation: Secondary | ICD-10-CM | POA: Diagnosis not present

## 2016-05-13 DIAGNOSIS — R627 Adult failure to thrive: Secondary | ICD-10-CM | POA: Diagnosis not present

## 2016-05-13 DIAGNOSIS — Z6830 Body mass index (BMI) 30.0-30.9, adult: Secondary | ICD-10-CM | POA: Diagnosis not present

## 2016-05-13 DIAGNOSIS — I5020 Unspecified systolic (congestive) heart failure: Secondary | ICD-10-CM | POA: Diagnosis not present

## 2016-05-13 DIAGNOSIS — J90 Pleural effusion, not elsewhere classified: Secondary | ICD-10-CM | POA: Diagnosis not present

## 2016-05-13 DIAGNOSIS — I272 Other secondary pulmonary hypertension: Secondary | ICD-10-CM | POA: Diagnosis not present

## 2016-05-13 DIAGNOSIS — L409 Psoriasis, unspecified: Secondary | ICD-10-CM | POA: Diagnosis not present

## 2016-05-13 DIAGNOSIS — D692 Other nonthrombocytopenic purpura: Secondary | ICD-10-CM | POA: Diagnosis not present

## 2016-05-13 DIAGNOSIS — E119 Type 2 diabetes mellitus without complications: Secondary | ICD-10-CM | POA: Diagnosis not present

## 2016-05-13 DIAGNOSIS — I10 Essential (primary) hypertension: Secondary | ICD-10-CM | POA: Diagnosis not present

## 2016-06-07 DIAGNOSIS — H01024 Squamous blepharitis left upper eyelid: Secondary | ICD-10-CM | POA: Diagnosis not present

## 2016-06-07 DIAGNOSIS — H02831 Dermatochalasis of right upper eyelid: Secondary | ICD-10-CM | POA: Diagnosis not present

## 2016-06-07 DIAGNOSIS — H02834 Dermatochalasis of left upper eyelid: Secondary | ICD-10-CM | POA: Diagnosis not present

## 2016-06-07 DIAGNOSIS — Z961 Presence of intraocular lens: Secondary | ICD-10-CM | POA: Diagnosis not present

## 2016-06-07 DIAGNOSIS — H01025 Squamous blepharitis left lower eyelid: Secondary | ICD-10-CM | POA: Diagnosis not present

## 2016-06-07 DIAGNOSIS — H04123 Dry eye syndrome of bilateral lacrimal glands: Secondary | ICD-10-CM | POA: Diagnosis not present

## 2016-06-07 DIAGNOSIS — H01022 Squamous blepharitis right lower eyelid: Secondary | ICD-10-CM | POA: Diagnosis not present

## 2016-06-07 DIAGNOSIS — H10413 Chronic giant papillary conjunctivitis, bilateral: Secondary | ICD-10-CM | POA: Diagnosis not present

## 2016-06-07 DIAGNOSIS — H01021 Squamous blepharitis right upper eyelid: Secondary | ICD-10-CM | POA: Diagnosis not present

## 2016-06-07 DIAGNOSIS — H40053 Ocular hypertension, bilateral: Secondary | ICD-10-CM | POA: Diagnosis not present

## 2016-08-07 DIAGNOSIS — L821 Other seborrheic keratosis: Secondary | ICD-10-CM | POA: Diagnosis not present

## 2016-08-07 DIAGNOSIS — D485 Neoplasm of uncertain behavior of skin: Secondary | ICD-10-CM | POA: Diagnosis not present

## 2016-08-07 DIAGNOSIS — D1801 Hemangioma of skin and subcutaneous tissue: Secondary | ICD-10-CM | POA: Diagnosis not present

## 2016-08-07 DIAGNOSIS — Z85828 Personal history of other malignant neoplasm of skin: Secondary | ICD-10-CM | POA: Diagnosis not present

## 2016-08-07 DIAGNOSIS — L570 Actinic keratosis: Secondary | ICD-10-CM | POA: Diagnosis not present

## 2016-08-07 DIAGNOSIS — Z85820 Personal history of malignant melanoma of skin: Secondary | ICD-10-CM | POA: Diagnosis not present

## 2016-08-07 DIAGNOSIS — D225 Melanocytic nevi of trunk: Secondary | ICD-10-CM | POA: Diagnosis not present

## 2016-09-02 DIAGNOSIS — E119 Type 2 diabetes mellitus without complications: Secondary | ICD-10-CM | POA: Diagnosis not present

## 2016-09-02 DIAGNOSIS — I10 Essential (primary) hypertension: Secondary | ICD-10-CM | POA: Diagnosis not present

## 2016-09-02 DIAGNOSIS — Z125 Encounter for screening for malignant neoplasm of prostate: Secondary | ICD-10-CM | POA: Diagnosis not present

## 2016-09-09 DIAGNOSIS — Z1389 Encounter for screening for other disorder: Secondary | ICD-10-CM | POA: Diagnosis not present

## 2016-09-09 DIAGNOSIS — E668 Other obesity: Secondary | ICD-10-CM | POA: Diagnosis not present

## 2016-09-09 DIAGNOSIS — I2789 Other specified pulmonary heart diseases: Secondary | ICD-10-CM | POA: Diagnosis not present

## 2016-09-09 DIAGNOSIS — R627 Adult failure to thrive: Secondary | ICD-10-CM | POA: Diagnosis not present

## 2016-09-09 DIAGNOSIS — L408 Other psoriasis: Secondary | ICD-10-CM | POA: Diagnosis not present

## 2016-09-09 DIAGNOSIS — Z23 Encounter for immunization: Secondary | ICD-10-CM | POA: Diagnosis not present

## 2016-09-09 DIAGNOSIS — E119 Type 2 diabetes mellitus without complications: Secondary | ICD-10-CM | POA: Diagnosis not present

## 2016-09-09 DIAGNOSIS — I5020 Unspecified systolic (congestive) heart failure: Secondary | ICD-10-CM | POA: Diagnosis not present

## 2016-09-09 DIAGNOSIS — Z0000 Encounter for general adult medical examination without abnormal findings: Secondary | ICD-10-CM | POA: Diagnosis not present

## 2016-09-09 DIAGNOSIS — I480 Paroxysmal atrial fibrillation: Secondary | ICD-10-CM | POA: Diagnosis not present

## 2016-09-09 DIAGNOSIS — K5909 Other constipation: Secondary | ICD-10-CM | POA: Diagnosis not present

## 2016-09-09 DIAGNOSIS — Z6831 Body mass index (BMI) 31.0-31.9, adult: Secondary | ICD-10-CM | POA: Diagnosis not present

## 2016-10-02 DIAGNOSIS — H6123 Impacted cerumen, bilateral: Secondary | ICD-10-CM | POA: Diagnosis not present

## 2016-10-02 DIAGNOSIS — H903 Sensorineural hearing loss, bilateral: Secondary | ICD-10-CM | POA: Diagnosis not present

## 2016-12-25 DIAGNOSIS — H40053 Ocular hypertension, bilateral: Secondary | ICD-10-CM | POA: Diagnosis not present

## 2016-12-25 DIAGNOSIS — H02834 Dermatochalasis of left upper eyelid: Secondary | ICD-10-CM | POA: Diagnosis not present

## 2016-12-25 DIAGNOSIS — H10413 Chronic giant papillary conjunctivitis, bilateral: Secondary | ICD-10-CM | POA: Diagnosis not present

## 2016-12-25 DIAGNOSIS — Z961 Presence of intraocular lens: Secondary | ICD-10-CM | POA: Diagnosis not present

## 2016-12-25 DIAGNOSIS — H02831 Dermatochalasis of right upper eyelid: Secondary | ICD-10-CM | POA: Diagnosis not present

## 2016-12-25 DIAGNOSIS — H04123 Dry eye syndrome of bilateral lacrimal glands: Secondary | ICD-10-CM | POA: Diagnosis not present

## 2017-02-11 DIAGNOSIS — D485 Neoplasm of uncertain behavior of skin: Secondary | ICD-10-CM | POA: Diagnosis not present

## 2017-02-11 DIAGNOSIS — L400 Psoriasis vulgaris: Secondary | ICD-10-CM | POA: Diagnosis not present

## 2017-02-11 DIAGNOSIS — Z85820 Personal history of malignant melanoma of skin: Secondary | ICD-10-CM | POA: Diagnosis not present

## 2017-02-11 DIAGNOSIS — C44319 Basal cell carcinoma of skin of other parts of face: Secondary | ICD-10-CM | POA: Diagnosis not present

## 2017-02-11 DIAGNOSIS — D1801 Hemangioma of skin and subcutaneous tissue: Secondary | ICD-10-CM | POA: Diagnosis not present

## 2017-02-11 DIAGNOSIS — L570 Actinic keratosis: Secondary | ICD-10-CM | POA: Diagnosis not present

## 2017-02-11 DIAGNOSIS — Z85828 Personal history of other malignant neoplasm of skin: Secondary | ICD-10-CM | POA: Diagnosis not present

## 2017-02-11 DIAGNOSIS — L821 Other seborrheic keratosis: Secondary | ICD-10-CM | POA: Diagnosis not present

## 2017-03-10 DIAGNOSIS — I480 Paroxysmal atrial fibrillation: Secondary | ICD-10-CM | POA: Diagnosis not present

## 2017-03-10 DIAGNOSIS — L408 Other psoriasis: Secondary | ICD-10-CM | POA: Diagnosis not present

## 2017-03-10 DIAGNOSIS — I10 Essential (primary) hypertension: Secondary | ICD-10-CM | POA: Diagnosis not present

## 2017-03-10 DIAGNOSIS — E119 Type 2 diabetes mellitus without complications: Secondary | ICD-10-CM | POA: Diagnosis not present

## 2017-03-10 DIAGNOSIS — D692 Other nonthrombocytopenic purpura: Secondary | ICD-10-CM | POA: Diagnosis not present

## 2017-03-10 DIAGNOSIS — E668 Other obesity: Secondary | ICD-10-CM | POA: Diagnosis not present

## 2017-03-10 DIAGNOSIS — I5020 Unspecified systolic (congestive) heart failure: Secondary | ICD-10-CM | POA: Diagnosis not present

## 2017-03-10 DIAGNOSIS — Z6832 Body mass index (BMI) 32.0-32.9, adult: Secondary | ICD-10-CM | POA: Diagnosis not present

## 2017-03-10 DIAGNOSIS — R627 Adult failure to thrive: Secondary | ICD-10-CM | POA: Diagnosis not present

## 2017-03-10 DIAGNOSIS — I2789 Other specified pulmonary heart diseases: Secondary | ICD-10-CM | POA: Diagnosis not present

## 2017-07-02 DIAGNOSIS — H40053 Ocular hypertension, bilateral: Secondary | ICD-10-CM | POA: Diagnosis not present

## 2017-07-02 DIAGNOSIS — H04123 Dry eye syndrome of bilateral lacrimal glands: Secondary | ICD-10-CM | POA: Diagnosis not present

## 2017-07-02 DIAGNOSIS — Z961 Presence of intraocular lens: Secondary | ICD-10-CM | POA: Diagnosis not present

## 2017-07-02 DIAGNOSIS — H10413 Chronic giant papillary conjunctivitis, bilateral: Secondary | ICD-10-CM | POA: Diagnosis not present

## 2017-08-13 DIAGNOSIS — L570 Actinic keratosis: Secondary | ICD-10-CM | POA: Diagnosis not present

## 2017-08-13 DIAGNOSIS — D225 Melanocytic nevi of trunk: Secondary | ICD-10-CM | POA: Diagnosis not present

## 2017-08-13 DIAGNOSIS — Z85820 Personal history of malignant melanoma of skin: Secondary | ICD-10-CM | POA: Diagnosis not present

## 2017-08-13 DIAGNOSIS — D485 Neoplasm of uncertain behavior of skin: Secondary | ICD-10-CM | POA: Diagnosis not present

## 2017-08-13 DIAGNOSIS — D1801 Hemangioma of skin and subcutaneous tissue: Secondary | ICD-10-CM | POA: Diagnosis not present

## 2017-08-13 DIAGNOSIS — Z85828 Personal history of other malignant neoplasm of skin: Secondary | ICD-10-CM | POA: Diagnosis not present

## 2017-08-13 DIAGNOSIS — L821 Other seborrheic keratosis: Secondary | ICD-10-CM | POA: Diagnosis not present

## 2017-09-08 DIAGNOSIS — E119 Type 2 diabetes mellitus without complications: Secondary | ICD-10-CM | POA: Diagnosis not present

## 2017-09-08 DIAGNOSIS — Z125 Encounter for screening for malignant neoplasm of prostate: Secondary | ICD-10-CM | POA: Diagnosis not present

## 2017-09-08 DIAGNOSIS — R82998 Other abnormal findings in urine: Secondary | ICD-10-CM | POA: Diagnosis not present

## 2017-09-08 DIAGNOSIS — I10 Essential (primary) hypertension: Secondary | ICD-10-CM | POA: Diagnosis not present

## 2017-09-15 DIAGNOSIS — E119 Type 2 diabetes mellitus without complications: Secondary | ICD-10-CM | POA: Diagnosis not present

## 2017-09-15 DIAGNOSIS — R627 Adult failure to thrive: Secondary | ICD-10-CM | POA: Diagnosis not present

## 2017-09-15 DIAGNOSIS — Z6831 Body mass index (BMI) 31.0-31.9, adult: Secondary | ICD-10-CM | POA: Diagnosis not present

## 2017-09-15 DIAGNOSIS — I5020 Unspecified systolic (congestive) heart failure: Secondary | ICD-10-CM | POA: Diagnosis not present

## 2017-09-15 DIAGNOSIS — K5909 Other constipation: Secondary | ICD-10-CM | POA: Diagnosis not present

## 2017-09-15 DIAGNOSIS — L408 Other psoriasis: Secondary | ICD-10-CM | POA: Diagnosis not present

## 2017-09-15 DIAGNOSIS — D692 Other nonthrombocytopenic purpura: Secondary | ICD-10-CM | POA: Diagnosis not present

## 2017-09-15 DIAGNOSIS — Z1389 Encounter for screening for other disorder: Secondary | ICD-10-CM | POA: Diagnosis not present

## 2017-09-15 DIAGNOSIS — I2721 Secondary pulmonary arterial hypertension: Secondary | ICD-10-CM | POA: Diagnosis not present

## 2017-09-15 DIAGNOSIS — Z23 Encounter for immunization: Secondary | ICD-10-CM | POA: Diagnosis not present

## 2017-09-15 DIAGNOSIS — E668 Other obesity: Secondary | ICD-10-CM | POA: Diagnosis not present

## 2017-09-15 DIAGNOSIS — Z0000 Encounter for general adult medical examination without abnormal findings: Secondary | ICD-10-CM | POA: Diagnosis not present

## 2017-09-18 DIAGNOSIS — Z1212 Encounter for screening for malignant neoplasm of rectum: Secondary | ICD-10-CM | POA: Diagnosis not present

## 2017-11-05 DIAGNOSIS — L570 Actinic keratosis: Secondary | ICD-10-CM | POA: Diagnosis not present

## 2017-11-05 DIAGNOSIS — Z85828 Personal history of other malignant neoplasm of skin: Secondary | ICD-10-CM | POA: Diagnosis not present

## 2017-11-05 DIAGNOSIS — Z85820 Personal history of malignant melanoma of skin: Secondary | ICD-10-CM | POA: Diagnosis not present

## 2017-12-18 DIAGNOSIS — H40053 Ocular hypertension, bilateral: Secondary | ICD-10-CM | POA: Diagnosis not present

## 2017-12-18 DIAGNOSIS — H01025 Squamous blepharitis left lower eyelid: Secondary | ICD-10-CM | POA: Diagnosis not present

## 2017-12-18 DIAGNOSIS — H01022 Squamous blepharitis right lower eyelid: Secondary | ICD-10-CM | POA: Diagnosis not present

## 2017-12-18 DIAGNOSIS — H04123 Dry eye syndrome of bilateral lacrimal glands: Secondary | ICD-10-CM | POA: Diagnosis not present

## 2017-12-18 DIAGNOSIS — Z961 Presence of intraocular lens: Secondary | ICD-10-CM | POA: Diagnosis not present

## 2017-12-18 DIAGNOSIS — H01021 Squamous blepharitis right upper eyelid: Secondary | ICD-10-CM | POA: Diagnosis not present

## 2017-12-18 DIAGNOSIS — H01024 Squamous blepharitis left upper eyelid: Secondary | ICD-10-CM | POA: Diagnosis not present

## 2018-02-11 DIAGNOSIS — D1801 Hemangioma of skin and subcutaneous tissue: Secondary | ICD-10-CM | POA: Diagnosis not present

## 2018-02-11 DIAGNOSIS — D485 Neoplasm of uncertain behavior of skin: Secondary | ICD-10-CM | POA: Diagnosis not present

## 2018-02-11 DIAGNOSIS — L570 Actinic keratosis: Secondary | ICD-10-CM | POA: Diagnosis not present

## 2018-02-11 DIAGNOSIS — Z85828 Personal history of other malignant neoplasm of skin: Secondary | ICD-10-CM | POA: Diagnosis not present

## 2018-02-11 DIAGNOSIS — D692 Other nonthrombocytopenic purpura: Secondary | ICD-10-CM | POA: Diagnosis not present

## 2018-02-11 DIAGNOSIS — Z85820 Personal history of malignant melanoma of skin: Secondary | ICD-10-CM | POA: Diagnosis not present

## 2018-02-11 DIAGNOSIS — L821 Other seborrheic keratosis: Secondary | ICD-10-CM | POA: Diagnosis not present

## 2018-03-17 DIAGNOSIS — H539 Unspecified visual disturbance: Secondary | ICD-10-CM | POA: Diagnosis not present

## 2018-03-17 DIAGNOSIS — D473 Essential (hemorrhagic) thrombocythemia: Secondary | ICD-10-CM | POA: Diagnosis not present

## 2018-03-17 DIAGNOSIS — L409 Psoriasis, unspecified: Secondary | ICD-10-CM | POA: Diagnosis not present

## 2018-03-17 DIAGNOSIS — D72829 Elevated white blood cell count, unspecified: Secondary | ICD-10-CM | POA: Diagnosis not present

## 2018-03-17 DIAGNOSIS — D692 Other nonthrombocytopenic purpura: Secondary | ICD-10-CM | POA: Diagnosis not present

## 2018-03-17 DIAGNOSIS — Z6830 Body mass index (BMI) 30.0-30.9, adult: Secondary | ICD-10-CM | POA: Diagnosis not present

## 2018-03-17 DIAGNOSIS — I480 Paroxysmal atrial fibrillation: Secondary | ICD-10-CM | POA: Diagnosis not present

## 2018-03-17 DIAGNOSIS — I10 Essential (primary) hypertension: Secondary | ICD-10-CM | POA: Diagnosis not present

## 2018-03-17 DIAGNOSIS — E119 Type 2 diabetes mellitus without complications: Secondary | ICD-10-CM | POA: Diagnosis not present

## 2018-03-17 DIAGNOSIS — I5020 Unspecified systolic (congestive) heart failure: Secondary | ICD-10-CM | POA: Diagnosis not present

## 2018-03-17 DIAGNOSIS — E668 Other obesity: Secondary | ICD-10-CM | POA: Diagnosis not present

## 2018-03-17 DIAGNOSIS — I2721 Secondary pulmonary arterial hypertension: Secondary | ICD-10-CM | POA: Diagnosis not present

## 2018-06-24 DIAGNOSIS — H40053 Ocular hypertension, bilateral: Secondary | ICD-10-CM | POA: Diagnosis not present

## 2018-08-06 DIAGNOSIS — H40053 Ocular hypertension, bilateral: Secondary | ICD-10-CM | POA: Diagnosis not present

## 2018-08-13 DIAGNOSIS — Z85828 Personal history of other malignant neoplasm of skin: Secondary | ICD-10-CM | POA: Diagnosis not present

## 2018-08-13 DIAGNOSIS — L570 Actinic keratosis: Secondary | ICD-10-CM | POA: Diagnosis not present

## 2018-08-13 DIAGNOSIS — Z85820 Personal history of malignant melanoma of skin: Secondary | ICD-10-CM | POA: Diagnosis not present

## 2018-08-13 DIAGNOSIS — D225 Melanocytic nevi of trunk: Secondary | ICD-10-CM | POA: Diagnosis not present

## 2018-08-13 DIAGNOSIS — L821 Other seborrheic keratosis: Secondary | ICD-10-CM | POA: Diagnosis not present

## 2018-08-13 DIAGNOSIS — D485 Neoplasm of uncertain behavior of skin: Secondary | ICD-10-CM | POA: Diagnosis not present

## 2018-09-10 DIAGNOSIS — R82998 Other abnormal findings in urine: Secondary | ICD-10-CM | POA: Diagnosis not present

## 2018-09-11 DIAGNOSIS — I10 Essential (primary) hypertension: Secondary | ICD-10-CM | POA: Diagnosis not present

## 2018-09-11 DIAGNOSIS — Z125 Encounter for screening for malignant neoplasm of prostate: Secondary | ICD-10-CM | POA: Diagnosis not present

## 2018-09-11 DIAGNOSIS — E119 Type 2 diabetes mellitus without complications: Secondary | ICD-10-CM | POA: Diagnosis not present

## 2018-09-18 DIAGNOSIS — I480 Paroxysmal atrial fibrillation: Secondary | ICD-10-CM | POA: Diagnosis not present

## 2018-09-18 DIAGNOSIS — Z23 Encounter for immunization: Secondary | ICD-10-CM | POA: Diagnosis not present

## 2018-09-18 DIAGNOSIS — I2721 Secondary pulmonary arterial hypertension: Secondary | ICD-10-CM | POA: Diagnosis not present

## 2018-09-18 DIAGNOSIS — R627 Adult failure to thrive: Secondary | ICD-10-CM | POA: Diagnosis not present

## 2018-09-18 DIAGNOSIS — E119 Type 2 diabetes mellitus without complications: Secondary | ICD-10-CM | POA: Diagnosis not present

## 2018-09-18 DIAGNOSIS — Z0000 Encounter for general adult medical examination without abnormal findings: Secondary | ICD-10-CM | POA: Diagnosis not present

## 2018-09-18 DIAGNOSIS — E668 Other obesity: Secondary | ICD-10-CM | POA: Diagnosis not present

## 2018-09-18 DIAGNOSIS — Z6830 Body mass index (BMI) 30.0-30.9, adult: Secondary | ICD-10-CM | POA: Diagnosis not present

## 2018-09-18 DIAGNOSIS — L409 Psoriasis, unspecified: Secondary | ICD-10-CM | POA: Diagnosis not present

## 2018-09-18 DIAGNOSIS — D692 Other nonthrombocytopenic purpura: Secondary | ICD-10-CM | POA: Diagnosis not present

## 2018-09-18 DIAGNOSIS — Z1389 Encounter for screening for other disorder: Secondary | ICD-10-CM | POA: Diagnosis not present

## 2018-09-18 DIAGNOSIS — C9100 Acute lymphoblastic leukemia not having achieved remission: Secondary | ICD-10-CM | POA: Diagnosis not present

## 2018-09-18 DIAGNOSIS — I5020 Unspecified systolic (congestive) heart failure: Secondary | ICD-10-CM | POA: Diagnosis not present

## 2019-04-05 DIAGNOSIS — I10 Essential (primary) hypertension: Secondary | ICD-10-CM | POA: Diagnosis not present

## 2019-04-05 DIAGNOSIS — M21371 Foot drop, right foot: Secondary | ICD-10-CM | POA: Diagnosis not present

## 2019-04-05 DIAGNOSIS — C9100 Acute lymphoblastic leukemia not having achieved remission: Secondary | ICD-10-CM | POA: Diagnosis not present

## 2019-04-05 DIAGNOSIS — Z1331 Encounter for screening for depression: Secondary | ICD-10-CM | POA: Diagnosis not present

## 2019-04-05 DIAGNOSIS — L409 Psoriasis, unspecified: Secondary | ICD-10-CM | POA: Diagnosis not present

## 2019-04-05 DIAGNOSIS — I2721 Secondary pulmonary arterial hypertension: Secondary | ICD-10-CM | POA: Diagnosis not present

## 2019-04-05 DIAGNOSIS — Z1339 Encounter for screening examination for other mental health and behavioral disorders: Secondary | ICD-10-CM | POA: Diagnosis not present

## 2019-04-05 DIAGNOSIS — R627 Adult failure to thrive: Secondary | ICD-10-CM | POA: Diagnosis not present

## 2019-04-05 DIAGNOSIS — I480 Paroxysmal atrial fibrillation: Secondary | ICD-10-CM | POA: Diagnosis not present

## 2019-04-05 DIAGNOSIS — I5020 Unspecified systolic (congestive) heart failure: Secondary | ICD-10-CM | POA: Diagnosis not present

## 2019-04-05 DIAGNOSIS — D692 Other nonthrombocytopenic purpura: Secondary | ICD-10-CM | POA: Diagnosis not present

## 2019-04-05 DIAGNOSIS — E119 Type 2 diabetes mellitus without complications: Secondary | ICD-10-CM | POA: Diagnosis not present

## 2019-04-05 DIAGNOSIS — E669 Obesity, unspecified: Secondary | ICD-10-CM | POA: Diagnosis not present

## 2019-04-14 DIAGNOSIS — L821 Other seborrheic keratosis: Secondary | ICD-10-CM | POA: Diagnosis not present

## 2019-04-14 DIAGNOSIS — Z85820 Personal history of malignant melanoma of skin: Secondary | ICD-10-CM | POA: Diagnosis not present

## 2019-04-14 DIAGNOSIS — C4442 Squamous cell carcinoma of skin of scalp and neck: Secondary | ICD-10-CM | POA: Diagnosis not present

## 2019-04-14 DIAGNOSIS — C44629 Squamous cell carcinoma of skin of left upper limb, including shoulder: Secondary | ICD-10-CM | POA: Diagnosis not present

## 2019-04-14 DIAGNOSIS — D485 Neoplasm of uncertain behavior of skin: Secondary | ICD-10-CM | POA: Diagnosis not present

## 2019-04-14 DIAGNOSIS — C44329 Squamous cell carcinoma of skin of other parts of face: Secondary | ICD-10-CM | POA: Diagnosis not present

## 2019-04-14 DIAGNOSIS — Z85828 Personal history of other malignant neoplasm of skin: Secondary | ICD-10-CM | POA: Diagnosis not present

## 2019-04-20 DIAGNOSIS — H11131 Conjunctival pigmentations, right eye: Secondary | ICD-10-CM | POA: Diagnosis not present

## 2019-04-20 DIAGNOSIS — H1851 Endothelial corneal dystrophy: Secondary | ICD-10-CM | POA: Diagnosis not present

## 2019-04-20 DIAGNOSIS — H40053 Ocular hypertension, bilateral: Secondary | ICD-10-CM | POA: Diagnosis not present

## 2019-04-20 DIAGNOSIS — H0102A Squamous blepharitis right eye, upper and lower eyelids: Secondary | ICD-10-CM | POA: Diagnosis not present

## 2019-04-20 DIAGNOSIS — H0102B Squamous blepharitis left eye, upper and lower eyelids: Secondary | ICD-10-CM | POA: Diagnosis not present

## 2019-04-20 DIAGNOSIS — H43813 Vitreous degeneration, bilateral: Secondary | ICD-10-CM | POA: Diagnosis not present

## 2019-08-03 ENCOUNTER — Other Ambulatory Visit: Payer: Self-pay

## 2019-08-03 ENCOUNTER — Encounter (HOSPITAL_COMMUNITY): Payer: Self-pay | Admitting: Emergency Medicine

## 2019-08-03 ENCOUNTER — Emergency Department (HOSPITAL_COMMUNITY)
Admission: EM | Admit: 2019-08-03 | Discharge: 2019-08-03 | Disposition: A | Discharge status: Home / Self Care | Payer: Medicare Other | Attending: Emergency Medicine | Admitting: Emergency Medicine

## 2019-08-03 DIAGNOSIS — Z87891 Personal history of nicotine dependence: Secondary | ICD-10-CM | POA: Insufficient documentation

## 2019-08-03 DIAGNOSIS — Y999 Unspecified external cause status: Secondary | ICD-10-CM | POA: Insufficient documentation

## 2019-08-03 DIAGNOSIS — Y929 Unspecified place or not applicable: Secondary | ICD-10-CM | POA: Diagnosis not present

## 2019-08-03 DIAGNOSIS — Y939 Activity, unspecified: Secondary | ICD-10-CM | POA: Diagnosis not present

## 2019-08-03 DIAGNOSIS — E119 Type 2 diabetes mellitus without complications: Secondary | ICD-10-CM | POA: Diagnosis not present

## 2019-08-03 DIAGNOSIS — R5381 Other malaise: Secondary | ICD-10-CM | POA: Diagnosis not present

## 2019-08-03 DIAGNOSIS — W2209XA Striking against other stationary object, initial encounter: Secondary | ICD-10-CM | POA: Diagnosis not present

## 2019-08-03 DIAGNOSIS — I10 Essential (primary) hypertension: Secondary | ICD-10-CM | POA: Insufficient documentation

## 2019-08-03 DIAGNOSIS — S59911A Unspecified injury of right forearm, initial encounter: Secondary | ICD-10-CM | POA: Diagnosis not present

## 2019-08-03 DIAGNOSIS — S51011D Laceration without foreign body of right elbow, subsequent encounter: Secondary | ICD-10-CM

## 2019-08-03 DIAGNOSIS — S51011A Laceration without foreign body of right elbow, initial encounter: Secondary | ICD-10-CM | POA: Diagnosis not present

## 2019-08-03 MED ORDER — LIDOCAINE-EPINEPHRINE (PF) 2 %-1:200000 IJ SOLN
20.0000 mL | Freq: Once | INTRAMUSCULAR | Status: AC
  Administered 2019-08-03: 20 mL via INTRADERMAL
  Filled 2019-08-03: qty 20

## 2019-08-03 NOTE — Discharge Instructions (Signed)
Keep the dressing on until tomorrow at lunchtime.  Return for worsening bleeding, feeling like you may pass out.

## 2019-08-03 NOTE — ED Notes (Signed)
Area cleansed and dressing applied to the area. Non-adherent dressing and rolled gauze.

## 2019-08-03 NOTE — ED Triage Notes (Signed)
Per [facility] pt from [facility] for skin tear 3 days ago that has continued to bleed and would like it looked at. Denies pains, 133/68, 60HR irreg hx a fib. 100% on RA.

## 2019-08-03 NOTE — ED Provider Notes (Signed)
[facility] DEPT Provider Note   CSN: [id number] Arrival date & time: 08/03/19  1007     History   Chief Complaint Chief Complaint  Patient presents with  . Arm Injury    right    HPI William Ibarra is a 85 y.o. male.     85 yo M with a chief complaints of a skin tear.  The patient fell about 3 days ago he said he slid his arm on his counter and suffered an injury to the skin.  He was seen at the local clinic and it was wrapped.  He is not sure what else they did to it.  Since then has been bleeding off and on.  Today he mentioned it to someone and they suggested he come to the ED for evaluation.  He denies feeling lightheaded or dizzy.  Denies repeat injury.  Denies other area of injury from the fall.  The history is provided by the patient.  Arm Injury Location:  Arm Arm location:  R forearm Injury: yes   Time since incident:  3 days Mechanism of injury: fall   Fall:    Fall occurred:  Standing   Impact surface: counter top.   Point of impact:  Outstretched arms   Entrapped after fall: no   Pain details:    Quality:  Aching   Radiates to:  Does not radiate   Severity:  Mild   Onset quality:  Gradual   Duration:  3 days   Timing:  Constant Handedness:  Right-handed Dislocation: no   Tetanus status:  Up to date Prior injury to area:  No Relieved by:  Nothing Worsened by:  Nothing Ineffective treatments:  None tried Associated symptoms: no fever     Past Medical History:  Diagnosis Date  . Atrial fibrillation ([place])   . BPH (benign prostatic hypertrophy)   . Diabetes mellitus   . Diverticulosis of colon    from a prior colonoscopy  . History of blood transfusion ~ 10/2013; 10/24/2014   "related to blood thinners"  . Hydrocele   . Hydrocele   . Hypertension   . Hypotension 02/05/2012  . Impaired glucose tolerance   . Overactive bladder   . Psoriasis   . Rosacea   . Spermatocele     Patient Active Problem List   Diagnosis Date Noted  . Hematoma 10/24/2014  . Hypotension 10/24/2014  . Anemia 02/06/2012    Class: Acute  . Hypotension 02/05/2012    Class: Acute  . Anticoagulation excessive 02/05/2012    Class: Acute  . Diabetes mellitus type 2, controlled ([place]) 02/05/2012    Class: Acute  . Atrial fibrillation ([place]) 02/05/2012    Class: Chronic  . Psoriasis 02/05/2012    Class: Chronic    Past Surgical History:  Procedure Laterality Date  . CATARACT EXTRACTION W/ INTRAOCULAR LENS  IMPLANT, BILATERAL Bilateral    "successful in left eye; redid right"  . HYDROCELE EXCISION / REPAIR    . MELANOMA EXCISION Right    eye  . TONSILLECTOMY          Home Medications    Prior to Admission medications   Medication Sig Start Date End Date Taking? Authorizing Provider  acetaminophen (TYLENOL) 325 MG tablet Take 2 tablets (650 mg total) by mouth every 6 (six) hours as needed for mild pain, moderate pain, fever or headache (or Fever >/= 101). 10/27/14   Shon Baton, MD  atenolol (TENORMIN) 25 MG tablet Take  0.5 tablets (12.5 mg total) by mouth daily. 10/27/14   Shon Baton, MD  cephALEXin (KEFLEX) 500 MG capsule Take 1 capsule (500 mg total) by mouth every 8 (eight) hours. 10/27/14   Shon Baton, MD  tamsulosin (FLOMAX) 0.4 MG CAPS capsule Take 1 capsule (0.4 mg total) by mouth at bedtime. 10/27/14   Shon Baton, MD    Family History No family history on file.  Social History Social History   Tobacco Use  . Smoking status: Former Smoker    Packs/day: 0.50    Years: 30.00    Pack years: 15.00    Types: Cigarettes    Quit date: 02/05/1992    Years since quitting: 27.5  . Smokeless tobacco: Never Used  Substance Use Topics  . Alcohol use: Yes    Alcohol/week: 7.0 standard drinks    Types: 7 Glasses of wine per week  . Drug use: No     Allergies   Patient has no known allergies.   Review of Systems Review of Systems  Constitutional: Negative for chills and fever.  HENT: Negative  for congestion and facial swelling.   Eyes: Negative for discharge and visual disturbance.  Respiratory: Negative for shortness of breath.   Cardiovascular: Negative for chest pain and palpitations.  Gastrointestinal: Negative for abdominal pain, diarrhea and vomiting.  Musculoskeletal: Negative for arthralgias and myalgias.  Skin: Positive for wound. Negative for color change and rash.  Neurological: Negative for tremors, syncope and headaches.  Psychiatric/Behavioral: Negative for confusion and dysphoric mood.     Physical Exam Updated Vital Signs BP (!) 148/58 (BP Location: Right Arm)   Pulse 89   Temp 98.7 F (37.1 C) (Oral)   Resp 16   SpO2 97%   Physical Exam Vitals signs and nursing note reviewed.  Constitutional:      Appearance: He is well-developed.  HENT:     Head: Normocephalic and atraumatic.  Eyes:     Pupils: Pupils are equal, round, and reactive to light.  Neck:     Musculoskeletal: Normal range of motion and neck supple.     Vascular: No JVD.  Cardiovascular:     Rate and Rhythm: Normal rate and regular rhythm.     Heart sounds: No murmur. No friction rub. No gallop.   Pulmonary:     Effort: No respiratory distress.     Breath sounds: No wheezing.  Abdominal:     General: There is no distension.     Tenderness: There is no guarding or rebound.  Musculoskeletal: Normal range of motion.     Comments: Right forearm has a skin tear with some avulsion of the skin.  He has 2 nodules lateral nodule is somewhat easy bleeding with minimal palpation.  Some of this was debrided, there is some small venous bleeding to the base of the lateral nodule.  Skin:    Coloration: Skin is not pale.     Findings: No rash.  Neurological:     Mental Status: He is alert and oriented to person, place, and time.  Psychiatric:        Behavior: Behavior normal.      ED Treatments / Results  Labs (all labs ordered are listed, but only abnormal results are displayed) Labs  Reviewed - No data to display  EKG None  Radiology No results found.  Procedures Procedures (including critical care time)  Medications Ordered in ED Medications  lidocaine-EPINEPHrine (XYLOCAINE W/EPI) 2 %-1:200000 (PF) injection 20 mL (20 mLs Intradermal Given  08/03/19 1045)     Initial Impression / Assessment and Plan / ED Course  I have reviewed the triage vital signs and the nursing notes.  Pertinent labs & imaging results that were available during my care of the patient were reviewed by me and considered in my medical decision making (see chart for details).        85 yo M with a chief complaint of bleeding to a skin tear.  This occurred about 3 days ago.  Continues to bleed.  Identified an area that had some venous bleeding.  It is adjacent to nodule of unknown etiology.  It stopped with direct pressure.  At this point I will apply a nonadherent dressing and have him follow-up with his doctor.  Seems less likely to be pyogenic granuloma as there are two in the area.  ? Avulsed nevus.   PCP follow up.   11:03 AM:  I have discussed the diagnosis/risks/treatment options with the patient and believe the pt to be eligible for discharge home to follow-up with PCP. We also discussed returning to the ED immediately if new or worsening sx occur. We discussed the sx which are most concerning (e.g., sudden worsening pain, fever, inability to tolerate by mouth) that necessitate immediate return. Medications administered to the patient during their visit and any new prescriptions provided to the patient are listed below.  Medications given during this visit Medications  lidocaine-EPINEPHrine (XYLOCAINE W/EPI) 2 %-1:200000 (PF) injection 20 mL (20 mLs Intradermal Given 08/03/19 1045)     The patient appears reasonably screen and/or stabilized for discharge and I doubt any other medical condition or other [facility] requiring further screening, evaluation, or treatment in the ED at this time  prior to discharge.    Final Clinical Impressions(s) / ED Diagnoses   Final diagnoses:  Skin tear of right elbow without complication, subsequent encounter    ED Discharge Orders    None       Deno Etienne, DO 08/03/19 1103

## 2019-09-20 DIAGNOSIS — E119 Type 2 diabetes mellitus without complications: Secondary | ICD-10-CM | POA: Diagnosis not present

## 2019-09-20 DIAGNOSIS — I10 Essential (primary) hypertension: Secondary | ICD-10-CM | POA: Diagnosis not present

## 2019-09-20 DIAGNOSIS — Z125 Encounter for screening for malignant neoplasm of prostate: Secondary | ICD-10-CM | POA: Diagnosis not present

## 2019-09-27 DIAGNOSIS — I10 Essential (primary) hypertension: Secondary | ICD-10-CM | POA: Diagnosis not present

## 2019-09-27 DIAGNOSIS — R82998 Other abnormal findings in urine: Secondary | ICD-10-CM | POA: Diagnosis not present

## 2019-10-27 ENCOUNTER — Other Ambulatory Visit: Payer: Self-pay

## 2019-10-27 ENCOUNTER — Encounter: Payer: Self-pay | Admitting: Plastic Surgery

## 2019-10-27 ENCOUNTER — Ambulatory Visit (INDEPENDENT_AMBULATORY_CARE_PROVIDER_SITE_OTHER): Payer: Medicare Other | Admitting: Plastic Surgery

## 2019-10-27 VITALS — BP 131/52 | HR 77 | Temp 97.9°F | Ht 73.0 in | Wt 204.0 lb

## 2019-10-27 DIAGNOSIS — L03011 Cellulitis of right finger: Secondary | ICD-10-CM

## 2019-10-27 MED ORDER — DOXYCYCLINE HYCLATE 100 MG PO TABS: 100.0000 mg | ORAL_TABLET | Freq: Two times a day (BID) | ORAL | 0 refills | Status: DC

## 2019-10-27 NOTE — Progress Notes (Signed)
Referring Provider Shon Baton, MD [address] [place],  [place] [address]   CC: No chief complaint on file. Right index finger infection  William Ibarra is an 85 y.o. male.  HPI: Patient presents with erythema and pain on the dorsal aspect of his right index finger.  This has been going on a little less than a week.  He was put on doxycycline by his primary care physician and has noticed improvement in the area of redness is getting smaller.  He has not had any fevers or systemic symptoms.  He has not had any trauma or prior issues like this.  No Known Allergies  Outpatient Encounter Medications as of 10/27/2019  Medication Sig  . losartan-hydrochlorothiazide (HYZAAR) 100-25 MG tablet Take by mouth.  . potassium chloride (MICRO-K) 10 MEQ CR capsule   . acetaminophen (TYLENOL) 325 MG tablet Take 2 tablets (650 mg total) by mouth every 6 (six) hours as needed for mild pain, moderate pain, fever or headache (or Fever >/= 101).  Marland Kitchen atenolol (TENORMIN) 25 MG tablet Take 0.5 tablets (12.5 mg total) by mouth daily. (Patient not taking: Reported on 10/27/2019)  . cephALEXin (KEFLEX) 500 MG capsule Take 1 capsule (500 mg total) by mouth every 8 (eight) hours. (Patient not taking: Reported on 10/27/2019)  . doxycycline (VIBRA-TABS) 100 MG tablet Take 1 tablet (100 mg total) by mouth 2 (two) times daily.  . furosemide (LASIX) 20 MG tablet Take 20 mg by mouth daily.  . tamsulosin (FLOMAX) 0.4 MG CAPS capsule Take 1 capsule (0.4 mg total) by mouth at bedtime.   No facility-administered encounter medications on file as of 10/27/2019.     Past Medical History:  Diagnosis Date  . Atrial fibrillation ([place])   . BPH (benign prostatic hypertrophy)   . Diabetes mellitus   . Diverticulosis of colon    from a prior colonoscopy  . History of blood transfusion ~ 10/2013; 10/24/2014   "related to blood thinners"  . Hydrocele   . Hydrocele   . Hypertension   . Hypotension 02/05/2012  . Impaired  glucose tolerance   . Overactive bladder   . Psoriasis   . Rosacea   . Spermatocele     Past Surgical History:  Procedure Laterality Date  . CATARACT EXTRACTION W/ INTRAOCULAR LENS  IMPLANT, BILATERAL Bilateral    "successful in left eye; redid right"  . HYDROCELE EXCISION / REPAIR    . MELANOMA EXCISION Right    eye  . TONSILLECTOMY      No family history on file.  Social History   Social History Narrative  . Not on file  Denies tobacco use  Review of Systems General: Denies fevers, chills, weight loss CV: Denies chest pain, shortness of breath, palpitations   Physical Exam Vitals with BMI 10/27/2019 08/03/2019 06/08/2015  Height 6\' 1"  - -  Weight 204 lbs - -  BMI 99991111 - -  Systolic [id number]  Diastolic 52 58 95  Pulse 77 89 -    General:  No acute distress,  Alert and oriented, Non-Toxic, Normal speech and affect Right hand: Fingers are well perfused with normal capillary refill and a palpable radial pulse.  Sensation is intact throughout.  He has full range of motion.  Focusing on the index finger there is some erythema extending from the eponychial fold proximally to the PIP joint.  I do not see any obvious areas of fluctuance or obvious fluid pockets to be drained.  There is no  erythema or pain on the volar surface of the finger.  Assessment/Plan Patient presents with what appears to be a mild paronychia.  I do not see an obvious collection to drain clinically.  As he is progressing on doxycycline we will continue this for another week and reevaluate after Christmas.  I discussed things to watch for that would prompt more urgent intervention.  Both the patient and his daughter seemed very understanding and I will plan to see them next week.  I also suggested warm salt water soaks twice a day.  William Ibarra 10/27/2019, 11:16 AM

## 2019-11-04 ENCOUNTER — Ambulatory Visit: Payer: Medicare Other | Admitting: Plastic Surgery

## 2019-11-04 ENCOUNTER — Other Ambulatory Visit: Payer: Self-pay

## 2019-11-04 ENCOUNTER — Other Ambulatory Visit (HOSPITAL_COMMUNITY)
Admission: RE | Admit: 2019-11-04 | Discharge: 2019-11-04 | Disposition: A | Discharge status: Home / Self Care | Payer: Medicare Other | Source: Ambulatory Visit | Attending: Plastic Surgery | Admitting: Plastic Surgery

## 2019-11-04 ENCOUNTER — Ambulatory Visit (INDEPENDENT_AMBULATORY_CARE_PROVIDER_SITE_OTHER): Payer: Medicare Other | Admitting: Plastic Surgery

## 2019-11-04 VITALS — BP 119/64 | HR 69 | Temp 97.8°F | Ht 72.0 in | Wt 200.0 lb

## 2019-11-04 DIAGNOSIS — L03011 Cellulitis of right finger: Secondary | ICD-10-CM

## 2019-11-04 MED ORDER — CEPHALEXIN 500 MG PO CAPS: 500.0000 mg | ORAL_CAPSULE | Freq: Four times a day (QID) | ORAL | 0 refills | Status: DC

## 2019-11-04 NOTE — Progress Notes (Signed)
   Referring Provider Shon Baton, MD [address] [place],   [address]   CC: No chief complaint on file. Right index finger paronychia  William Ibarra is an 85 y.o. male.  HPI: Patient presents 1 week out from evaluation for right index finger paronychia.  Last week there was no clear fluid collection then I would continue his doxycycline.  He feels overall better but still has some tenderness around the eponychial fold on that finger.  He also believes of the doxycycline is affecting his appetite.  Review of Systems General: Denies fevers, chills  Physical Exam Vitals with BMI 11/04/2019 10/27/2019 08/03/2019  Height 6\' 0"  6\' 1"  -  Weight 200 lbs 204 lbs -  BMI [id number] -  Systolic 123456 [id number]  Diastolic 64 52 58  Pulse 69 77 89    General:  No acute distress,  Alert and oriented, Non-Toxic, Normal speech and affect Examination of the right index finger shows improving cellulitis and swelling however now on the eponychial fold there is a clear collection of purulence.  This would benefit from drainage.  Assessment/Plan Patient presents with a paronychia of the right index finger.  While this is improving there is a clear collection of purulence at this point should be drained.  This would accelerate his recovery.  We will plan to do this today and put him on Ancef for the next week.  Procedure note: Procedure: Drainage of right index finger paronychia Preoperative diagnosis right index finger paronychia Postoperative diagnosis is the same Description of procedure: The finger is anesthetized with 1% lidocaine with epinephrine.  The area was prepped and draped sterilely.  A 15 blade was used to incise the paronychia.  All the purulence was expressed.  Culture swab was taken.  Finger was wrapped with Xeroform 4 x 4 and Coban.  He tolerated this well I will plan to see him next week.  Cindra Presume 11/04/2019, 5:40 PM

## 2019-11-08 DIAGNOSIS — Z23 Encounter for immunization: Secondary | ICD-10-CM | POA: Diagnosis not present

## 2019-11-09 ENCOUNTER — Telehealth: Payer: Self-pay

## 2019-11-09 LAB — AEROBIC/ANAEROBIC CULTURE W GRAM STAIN (SURGICAL/DEEP WOUND): Culture: NORMAL

## 2019-11-09 NOTE — Telephone Encounter (Signed)

## 2019-11-10 ENCOUNTER — Encounter: Payer: Self-pay | Admitting: Plastic Surgery

## 2019-11-10 ENCOUNTER — Other Ambulatory Visit: Payer: Self-pay

## 2019-11-10 ENCOUNTER — Ambulatory Visit (INDEPENDENT_AMBULATORY_CARE_PROVIDER_SITE_OTHER): Payer: Medicare Other | Admitting: Plastic Surgery

## 2019-11-10 VITALS — BP 113/53 | HR 51 | Temp 97.7°F | Ht 72.0 in | Wt 200.0 lb

## 2019-11-10 DIAGNOSIS — L03011 Cellulitis of right finger: Secondary | ICD-10-CM

## 2019-11-10 NOTE — Progress Notes (Signed)
Patient presents after drainage of a paronychia last week.  He feels fine and thinks things are mostly better at this point.  He is finished his course of Keflex.  Nothing is grown out of his cultures.  On exam his finger looks good there is minimal tenderness.  He has full range of motion.  There is no drainage or fluctuance.  We will plan to see him again as needed.

## 2019-12-06 DIAGNOSIS — Z23 Encounter for immunization: Secondary | ICD-10-CM | POA: Diagnosis not present

## 2020-03-17 DIAGNOSIS — N182 Chronic kidney disease, stage 2 (mild): Secondary | ICD-10-CM | POA: Diagnosis not present

## 2020-03-17 DIAGNOSIS — R600 Localized edema: Secondary | ICD-10-CM | POA: Diagnosis not present

## 2020-03-17 DIAGNOSIS — I130 Hypertensive heart and chronic kidney disease with heart failure and stage 1 through stage 4 chronic kidney disease, or unspecified chronic kidney disease: Secondary | ICD-10-CM | POA: Diagnosis not present

## 2020-03-17 DIAGNOSIS — S80812A Abrasion, left lower leg, initial encounter: Secondary | ICD-10-CM | POA: Diagnosis not present

## 2020-03-17 DIAGNOSIS — I5020 Unspecified systolic (congestive) heart failure: Secondary | ICD-10-CM | POA: Diagnosis not present

## 2020-07-24 ENCOUNTER — Other Ambulatory Visit: Payer: Self-pay

## 2020-07-24 ENCOUNTER — Emergency Department (HOSPITAL_COMMUNITY)
Admission: EM | Admit: 2020-07-24 | Discharge: 2020-07-24 | Disposition: A | Discharge status: Home / Self Care | Payer: Medicare Other | Attending: Emergency Medicine | Admitting: Emergency Medicine

## 2020-07-24 DIAGNOSIS — I10 Essential (primary) hypertension: Secondary | ICD-10-CM | POA: Diagnosis not present

## 2020-07-24 DIAGNOSIS — W010XXA Fall on same level from slipping, tripping and stumbling without subsequent striking against object, initial encounter: Secondary | ICD-10-CM | POA: Diagnosis not present

## 2020-07-24 DIAGNOSIS — E119 Type 2 diabetes mellitus without complications: Secondary | ICD-10-CM | POA: Diagnosis not present

## 2020-07-24 DIAGNOSIS — Z87891 Personal history of nicotine dependence: Secondary | ICD-10-CM | POA: Diagnosis not present

## 2020-07-24 DIAGNOSIS — Z79899 Other long term (current) drug therapy: Secondary | ICD-10-CM | POA: Diagnosis not present

## 2020-07-24 DIAGNOSIS — I959 Hypotension, unspecified: Secondary | ICD-10-CM | POA: Diagnosis not present

## 2020-07-24 DIAGNOSIS — W19XXXA Unspecified fall, initial encounter: Secondary | ICD-10-CM

## 2020-07-24 DIAGNOSIS — S41111A Laceration without foreign body of right upper arm, initial encounter: Secondary | ICD-10-CM | POA: Insufficient documentation

## 2020-07-24 DIAGNOSIS — S51019A Laceration without foreign body of unspecified elbow, initial encounter: Secondary | ICD-10-CM

## 2020-07-24 DIAGNOSIS — S8991XA Unspecified injury of right lower leg, initial encounter: Secondary | ICD-10-CM | POA: Diagnosis not present

## 2020-07-24 DIAGNOSIS — S4991XA Unspecified injury of right shoulder and upper arm, initial encounter: Secondary | ICD-10-CM | POA: Diagnosis present

## 2020-07-24 DIAGNOSIS — R58 Hemorrhage, not elsewhere classified: Secondary | ICD-10-CM | POA: Diagnosis not present

## 2020-07-24 DIAGNOSIS — R001 Bradycardia, unspecified: Secondary | ICD-10-CM | POA: Diagnosis not present

## 2020-07-24 NOTE — ED Provider Notes (Signed)
[facility] DE[facility] Provider Note   CSN: [id number] Arrival date & time: 07/24/20  1639     History Chief Complaint  Patient presents with  . Fall    William Ibarra is a 86 y.o. male who presents emergency department with a chief complaint of fall. He was unable to get up from the floor for about 3 hours. Later he was found by staff at his assisted living facility. He has multiple skin tears. He denies hitting his head or losing consciousness. He does not take any blood thinners. He has a history of chronic atrial fibrillation. He states that he is up-to-date on his tetanus vaccination.  HPI     Past Medical History:  Diagnosis Date  . Atrial fibrillation ([place])   . BPH (benign prostatic hypertrophy)   . Diabetes mellitus   . Diverticulosis of colon    from a prior colonoscopy  . History of blood transfusion ~ 10/2013; 10/24/2014   "related to blood thinners"  . Hydrocele   . Hydrocele   . Hypertension   . Hypotension 02/05/2012  . Impaired glucose tolerance   . Overactive bladder   . Psoriasis   . Rosacea   . Spermatocele     Patient Active Problem List   Diagnosis Date Noted  . Hematoma 10/24/2014  . Hypotension 10/24/2014  . Anemia 02/06/2012    Class: Acute  . Hypotension 02/05/2012    Class: Acute  . Anticoagulation excessive 02/05/2012    Class: Acute  . Diabetes mellitus type 2, controlled ([place]) 02/05/2012    Class: Acute  . Atrial fibrillation ([place]) 02/05/2012    Class: Chronic  . Psoriasis 02/05/2012    Class: Chronic    Past Surgical History:  Procedure Laterality Date  . CATARACT EXTRACTION W/ INTRAOCULAR LENS  IMPLANT, BILATERAL Bilateral    "successful in left eye; redid right"  . HYDROCELE EXCISION / REPAIR    . MELANOMA EXCISION Right    eye  . TONSILLECTOMY         No family history on file.  Social History   Tobacco Use  . Smoking status: Former Smoker    Packs/day: 0.50    Years: 30.00    Pack  years: 15.00    Types: Cigarettes    Quit date: 02/05/1992    Years since quitting: 28.4  . Smokeless tobacco: Never Used  Substance Use Topics  . Alcohol use: Yes    Alcohol/week: 7.0 standard drinks    Types: 7 Glasses of wine per week  . Drug use: No    Home Medications Prior to Admission medications   Medication Sig Start Date End Date Taking? Authorizing Provider  acetaminophen (TYLENOL) 325 MG tablet Take 2 tablets (650 mg total) by mouth every 6 (six) hours as needed for mild pain, moderate pain, fever or headache (or Fever >/= 101). 10/27/14   Shon Baton, MD  atenolol (TENORMIN) 25 MG tablet Take 0.5 tablets (12.5 mg total) by mouth daily. 10/27/14   Shon Baton, MD  furosemide (LASIX) 20 MG tablet Take 20 mg by mouth daily. 10/08/19   [provider]  losartan-hydrochlorothiazide (HYZAAR) 100-25 MG tablet Take by mouth. 02/04/14   [provider]  potassium chloride (MICRO-K) 10 MEQ CR capsule  09/19/16   [provider]  tamsulosin (FLOMAX) 0.4 MG CAPS capsule Take 1 capsule (0.4 mg total) by mouth at bedtime. 10/27/14   Shon Baton, MD    Allergies    Patient has  no known allergies.  Review of Systems   Review of Systems Ten systems reviewed and are negative for acute change, except as noted in the HPI.   Physical Exam Updated Vital Signs BP 136/63 (BP Location: Right Arm)   Pulse (!) 56   Temp 97.8 F (36.6 C) (Oral)   Resp (!) 21   Ht 6\' 1"  (1.854 m)   Wt 90.7 kg   SpO2 97%   BMI 26.39 kg/m   Physical Exam Vitals and nursing note reviewed.  Constitutional:      General: He is not in acute distress.    Appearance: He is well-developed. He is not diaphoretic.  HENT:     Head: Normocephalic and atraumatic.  Eyes:     General: No scleral icterus.    Conjunctiva/sclera: Conjunctivae normal.  Cardiovascular:     Rate and Rhythm: Normal rate and regular rhythm.     Heart sounds: Normal heart sounds.  Pulmonary:     Effort:  Pulmonary effort is normal. No respiratory distress.     Breath sounds: Normal breath sounds.  Abdominal:     Palpations: Abdomen is soft.     Tenderness: There is no abdominal tenderness.  Musculoskeletal:     Cervical back: Normal range of motion and neck supple.  Skin:    General: Skin is warm and dry.     Comments: Multiple small skin tears to the arms and legs.   Neurological:     Mental Status: He is alert.  Psychiatric:        Behavior: Behavior normal.     ED Results / Procedures / Treatments   Labs (all labs ordered are listed, but only abnormal results are displayed) Labs Reviewed - No data to display  EKG None  Radiology No results found.  Procedures Procedures (including critical care time)  Medications Ordered in ED Medications - No data to display  ED Course  I have reviewed the triage vital signs and the nursing notes.  Pertinent labs & imaging results that were available during my care of the patient were reviewed by me and considered in my medical decision making (see chart for details).    MDM Rules/Calculators/A&P                          86 year old gentleman here after fall and having difficulty getting up off the floor for about 3 hours. He has no obvious head or neck trauma, normal upper extremity strength without any weakness or paresthesia. He denies any pain. He has multiple small skin tears which have been cleaned and bandaged and without need for other intervention. Would like to be discharged home. He has normal mentation and able to tell me all of what occurred today. Seen and shared visit with Dr. Karle Starch we both agree he does not need any further imaging. He is able to move all extremities. He has no obvious deformities or pain. He is ambulatory with a walker he just had difficulty getting off the floor. Patient is okay with this plan and we have advised the patient that he should he have any changes or new concerns he should return  immediately to be reevaluated. Final Clinical Impression(s) / ED Diagnoses Final diagnoses:  None    Rx / DC Orders ED Discharge Orders    None       Margarita Mail, PA-C 07/24/20 1754    Truddie Hidden, MD 07/24/20 1840

## 2020-07-24 NOTE — Discharge Instructions (Addendum)
Get help right away if: Your pain suddenly increases and is severe. You develop severe swelling around the wound. The wound is on your hand or foot and you cannot properly move a finger or toe. The wound is on your hand or foot, and you notice that your fingers or toes look pale or bluish. You have a red streak going away from your wound.

## 2020-07-24 NOTE — ED Triage Notes (Signed)
Fell in bathroom this morning, struggled for 3 hours to stand up before he was found by staff in his independent living facility called [facility]. Multiple skin tears on right arm and right leg. Unknown wether he hit is head or not, no LOC upon fall. Patient able to stand without assistance and ambulate using walker.

## 2020-07-24 NOTE — ED Provider Notes (Signed)
Patient seen and examined, agree with assessment and plan by APP. Patient from independent living reports he lost his balance and fell in the bathroom earlier today. Sustained some skin tears, but denies any other injuries, no head injury, LOC or neck pain. He was having trouble getting up and so staff called EMS who transported him here. He is awake, alert and denies any pain. He would like to go back home. Advised to return if he begins to have any pain or other concerns.    Truddie Hidden, MD 07/24/20 [phone]

## 2020-07-27 DIAGNOSIS — L089 Local infection of the skin and subcutaneous tissue, unspecified: Secondary | ICD-10-CM | POA: Diagnosis not present

## 2020-07-27 DIAGNOSIS — N182 Chronic kidney disease, stage 2 (mild): Secondary | ICD-10-CM | POA: Diagnosis not present

## 2020-07-27 DIAGNOSIS — D473 Essential (hemorrhagic) thrombocythemia: Secondary | ICD-10-CM | POA: Diagnosis not present

## 2020-07-27 DIAGNOSIS — D649 Anemia, unspecified: Secondary | ICD-10-CM | POA: Diagnosis not present

## 2020-07-27 DIAGNOSIS — I480 Paroxysmal atrial fibrillation: Secondary | ICD-10-CM | POA: Diagnosis not present

## 2020-07-27 DIAGNOSIS — S90819A Abrasion, unspecified foot, initial encounter: Secondary | ICD-10-CM | POA: Diagnosis not present

## 2020-07-27 DIAGNOSIS — I130 Hypertensive heart and chronic kidney disease with heart failure and stage 1 through stage 4 chronic kidney disease, or unspecified chronic kidney disease: Secondary | ICD-10-CM | POA: Diagnosis not present

## 2020-07-27 DIAGNOSIS — W1830XA Fall on same level, unspecified, initial encounter: Secondary | ICD-10-CM | POA: Diagnosis not present

## 2020-07-27 DIAGNOSIS — C9100 Acute lymphoblastic leukemia not having achieved remission: Secondary | ICD-10-CM | POA: Diagnosis not present

## 2020-07-27 DIAGNOSIS — I2721 Secondary pulmonary arterial hypertension: Secondary | ICD-10-CM | POA: Diagnosis not present

## 2020-08-03 DIAGNOSIS — W1830XA Fall on same level, unspecified, initial encounter: Secondary | ICD-10-CM | POA: Diagnosis not present

## 2020-08-03 DIAGNOSIS — R600 Localized edema: Secondary | ICD-10-CM | POA: Diagnosis not present

## 2020-08-03 DIAGNOSIS — R531 Weakness: Secondary | ICD-10-CM | POA: Diagnosis not present

## 2020-08-03 DIAGNOSIS — S90819A Abrasion, unspecified foot, initial encounter: Secondary | ICD-10-CM | POA: Diagnosis not present

## 2020-08-03 DIAGNOSIS — I2721 Secondary pulmonary arterial hypertension: Secondary | ICD-10-CM | POA: Diagnosis not present

## 2020-08-03 DIAGNOSIS — L089 Local infection of the skin and subcutaneous tissue, unspecified: Secondary | ICD-10-CM | POA: Diagnosis not present

## 2020-08-03 DIAGNOSIS — I87311 Chronic venous hypertension (idiopathic) with ulcer of right lower extremity: Secondary | ICD-10-CM | POA: Diagnosis not present

## 2020-08-03 DIAGNOSIS — L97919 Non-pressure chronic ulcer of unspecified part of right lower leg with unspecified severity: Secondary | ICD-10-CM | POA: Diagnosis not present

## 2020-08-10 DIAGNOSIS — R2689 Other abnormalities of gait and mobility: Secondary | ICD-10-CM | POA: Diagnosis not present

## 2020-08-10 DIAGNOSIS — Z9181 History of falling: Secondary | ICD-10-CM | POA: Diagnosis not present

## 2020-08-10 DIAGNOSIS — M21379 Foot drop, unspecified foot: Secondary | ICD-10-CM | POA: Diagnosis not present

## 2020-08-10 DIAGNOSIS — M6281 Muscle weakness (generalized): Secondary | ICD-10-CM | POA: Diagnosis not present

## 2020-08-10 DIAGNOSIS — R2681 Unsteadiness on feet: Secondary | ICD-10-CM | POA: Diagnosis not present

## 2020-08-14 DIAGNOSIS — R2689 Other abnormalities of gait and mobility: Secondary | ICD-10-CM | POA: Diagnosis not present

## 2020-08-14 DIAGNOSIS — M6281 Muscle weakness (generalized): Secondary | ICD-10-CM | POA: Diagnosis not present

## 2020-08-14 DIAGNOSIS — R2681 Unsteadiness on feet: Secondary | ICD-10-CM | POA: Diagnosis not present

## 2020-08-14 DIAGNOSIS — M21379 Foot drop, unspecified foot: Secondary | ICD-10-CM | POA: Diagnosis not present

## 2020-08-14 DIAGNOSIS — Z9181 History of falling: Secondary | ICD-10-CM | POA: Diagnosis not present

## 2020-08-15 DIAGNOSIS — M6281 Muscle weakness (generalized): Secondary | ICD-10-CM | POA: Diagnosis not present

## 2020-08-15 DIAGNOSIS — Z9181 History of falling: Secondary | ICD-10-CM | POA: Diagnosis not present

## 2020-08-15 DIAGNOSIS — R2681 Unsteadiness on feet: Secondary | ICD-10-CM | POA: Diagnosis not present

## 2020-08-15 DIAGNOSIS — R2689 Other abnormalities of gait and mobility: Secondary | ICD-10-CM | POA: Diagnosis not present

## 2020-08-15 DIAGNOSIS — M21379 Foot drop, unspecified foot: Secondary | ICD-10-CM | POA: Diagnosis not present

## 2020-08-16 DIAGNOSIS — M6281 Muscle weakness (generalized): Secondary | ICD-10-CM | POA: Diagnosis not present

## 2020-08-16 DIAGNOSIS — M21379 Foot drop, unspecified foot: Secondary | ICD-10-CM | POA: Diagnosis not present

## 2020-08-16 DIAGNOSIS — Z9181 History of falling: Secondary | ICD-10-CM | POA: Diagnosis not present

## 2020-08-16 DIAGNOSIS — R2681 Unsteadiness on feet: Secondary | ICD-10-CM | POA: Diagnosis not present

## 2020-08-16 DIAGNOSIS — R2689 Other abnormalities of gait and mobility: Secondary | ICD-10-CM | POA: Diagnosis not present

## 2020-08-18 DIAGNOSIS — R2681 Unsteadiness on feet: Secondary | ICD-10-CM | POA: Diagnosis not present

## 2020-08-18 DIAGNOSIS — M21379 Foot drop, unspecified foot: Secondary | ICD-10-CM | POA: Diagnosis not present

## 2020-08-18 DIAGNOSIS — Z9181 History of falling: Secondary | ICD-10-CM | POA: Diagnosis not present

## 2020-08-18 DIAGNOSIS — R2689 Other abnormalities of gait and mobility: Secondary | ICD-10-CM | POA: Diagnosis not present

## 2020-08-18 DIAGNOSIS — M6281 Muscle weakness (generalized): Secondary | ICD-10-CM | POA: Diagnosis not present

## 2020-08-22 DIAGNOSIS — R2689 Other abnormalities of gait and mobility: Secondary | ICD-10-CM | POA: Diagnosis not present

## 2020-08-22 DIAGNOSIS — M21379 Foot drop, unspecified foot: Secondary | ICD-10-CM | POA: Diagnosis not present

## 2020-08-22 DIAGNOSIS — Z9181 History of falling: Secondary | ICD-10-CM | POA: Diagnosis not present

## 2020-08-22 DIAGNOSIS — R2681 Unsteadiness on feet: Secondary | ICD-10-CM | POA: Diagnosis not present

## 2020-08-22 DIAGNOSIS — M6281 Muscle weakness (generalized): Secondary | ICD-10-CM | POA: Diagnosis not present

## 2020-08-23 DIAGNOSIS — R2681 Unsteadiness on feet: Secondary | ICD-10-CM | POA: Diagnosis not present

## 2020-08-23 DIAGNOSIS — Z9181 History of falling: Secondary | ICD-10-CM | POA: Diagnosis not present

## 2020-08-23 DIAGNOSIS — M21379 Foot drop, unspecified foot: Secondary | ICD-10-CM | POA: Diagnosis not present

## 2020-08-23 DIAGNOSIS — R2689 Other abnormalities of gait and mobility: Secondary | ICD-10-CM | POA: Diagnosis not present

## 2020-08-23 DIAGNOSIS — M6281 Muscle weakness (generalized): Secondary | ICD-10-CM | POA: Diagnosis not present

## 2020-08-24 DIAGNOSIS — R2681 Unsteadiness on feet: Secondary | ICD-10-CM | POA: Diagnosis not present

## 2020-08-24 DIAGNOSIS — Z9181 History of falling: Secondary | ICD-10-CM | POA: Diagnosis not present

## 2020-08-24 DIAGNOSIS — M6281 Muscle weakness (generalized): Secondary | ICD-10-CM | POA: Diagnosis not present

## 2020-08-24 DIAGNOSIS — R2689 Other abnormalities of gait and mobility: Secondary | ICD-10-CM | POA: Diagnosis not present

## 2020-08-24 DIAGNOSIS — M21379 Foot drop, unspecified foot: Secondary | ICD-10-CM | POA: Diagnosis not present

## 2020-08-25 DIAGNOSIS — M21379 Foot drop, unspecified foot: Secondary | ICD-10-CM | POA: Diagnosis not present

## 2020-08-25 DIAGNOSIS — R2689 Other abnormalities of gait and mobility: Secondary | ICD-10-CM | POA: Diagnosis not present

## 2020-08-25 DIAGNOSIS — M6281 Muscle weakness (generalized): Secondary | ICD-10-CM | POA: Diagnosis not present

## 2020-08-25 DIAGNOSIS — R2681 Unsteadiness on feet: Secondary | ICD-10-CM | POA: Diagnosis not present

## 2020-08-25 DIAGNOSIS — Z9181 History of falling: Secondary | ICD-10-CM | POA: Diagnosis not present

## 2020-08-29 DIAGNOSIS — Z9181 History of falling: Secondary | ICD-10-CM | POA: Diagnosis not present

## 2020-08-29 DIAGNOSIS — M21379 Foot drop, unspecified foot: Secondary | ICD-10-CM | POA: Diagnosis not present

## 2020-08-29 DIAGNOSIS — M6281 Muscle weakness (generalized): Secondary | ICD-10-CM | POA: Diagnosis not present

## 2020-08-29 DIAGNOSIS — R2681 Unsteadiness on feet: Secondary | ICD-10-CM | POA: Diagnosis not present

## 2020-08-29 DIAGNOSIS — R2689 Other abnormalities of gait and mobility: Secondary | ICD-10-CM | POA: Diagnosis not present

## 2020-08-30 DIAGNOSIS — M6281 Muscle weakness (generalized): Secondary | ICD-10-CM | POA: Diagnosis not present

## 2020-08-30 DIAGNOSIS — Z9181 History of falling: Secondary | ICD-10-CM | POA: Diagnosis not present

## 2020-08-30 DIAGNOSIS — M21379 Foot drop, unspecified foot: Secondary | ICD-10-CM | POA: Diagnosis not present

## 2020-08-30 DIAGNOSIS — R2689 Other abnormalities of gait and mobility: Secondary | ICD-10-CM | POA: Diagnosis not present

## 2020-08-30 DIAGNOSIS — R2681 Unsteadiness on feet: Secondary | ICD-10-CM | POA: Diagnosis not present

## 2020-08-31 DIAGNOSIS — R2681 Unsteadiness on feet: Secondary | ICD-10-CM | POA: Diagnosis not present

## 2020-08-31 DIAGNOSIS — R2689 Other abnormalities of gait and mobility: Secondary | ICD-10-CM | POA: Diagnosis not present

## 2020-08-31 DIAGNOSIS — M6281 Muscle weakness (generalized): Secondary | ICD-10-CM | POA: Diagnosis not present

## 2020-08-31 DIAGNOSIS — Z9181 History of falling: Secondary | ICD-10-CM | POA: Diagnosis not present

## 2020-08-31 DIAGNOSIS — M21379 Foot drop, unspecified foot: Secondary | ICD-10-CM | POA: Diagnosis not present

## 2020-09-01 DIAGNOSIS — Z9181 History of falling: Secondary | ICD-10-CM | POA: Diagnosis not present

## 2020-09-01 DIAGNOSIS — M21379 Foot drop, unspecified foot: Secondary | ICD-10-CM | POA: Diagnosis not present

## 2020-09-01 DIAGNOSIS — R2689 Other abnormalities of gait and mobility: Secondary | ICD-10-CM | POA: Diagnosis not present

## 2020-09-01 DIAGNOSIS — R2681 Unsteadiness on feet: Secondary | ICD-10-CM | POA: Diagnosis not present

## 2020-09-01 DIAGNOSIS — M6281 Muscle weakness (generalized): Secondary | ICD-10-CM | POA: Diagnosis not present

## 2020-09-02 ENCOUNTER — Other Ambulatory Visit: Payer: Self-pay

## 2020-09-02 ENCOUNTER — Inpatient Hospital Stay (HOSPITAL_COMMUNITY)
Admission: EM | Admit: 2020-09-02 | Discharge: 2020-09-06 | DRG: 841 | Disposition: A | Discharge status: Home / Self Care | Payer: Medicare Other | Attending: Internal Medicine | Admitting: Internal Medicine

## 2020-09-02 ENCOUNTER — Emergency Department (HOSPITAL_COMMUNITY): Payer: Medicare Other

## 2020-09-02 ENCOUNTER — Encounter (HOSPITAL_COMMUNITY): Payer: Self-pay | Admitting: Internal Medicine

## 2020-09-02 DIAGNOSIS — R0902 Hypoxemia: Secondary | ICD-10-CM | POA: Diagnosis not present

## 2020-09-02 DIAGNOSIS — C9110 Chronic lymphocytic leukemia of B-cell type not having achieved remission: Secondary | ICD-10-CM | POA: Diagnosis not present

## 2020-09-02 DIAGNOSIS — D62 Acute posthemorrhagic anemia: Secondary | ICD-10-CM | POA: Diagnosis present

## 2020-09-02 DIAGNOSIS — Y92009 Unspecified place in unspecified non-institutional (private) residence as the place of occurrence of the external cause: Secondary | ICD-10-CM

## 2020-09-02 DIAGNOSIS — L409 Psoriasis, unspecified: Secondary | ICD-10-CM | POA: Diagnosis present

## 2020-09-02 DIAGNOSIS — Z043 Encounter for examination and observation following other accident: Secondary | ICD-10-CM | POA: Diagnosis not present

## 2020-09-02 DIAGNOSIS — Z823 Family history of stroke: Secondary | ICD-10-CM

## 2020-09-02 DIAGNOSIS — E119 Type 2 diabetes mellitus without complications: Secondary | ICD-10-CM | POA: Diagnosis present

## 2020-09-02 DIAGNOSIS — R58 Hemorrhage, not elsewhere classified: Secondary | ICD-10-CM | POA: Diagnosis not present

## 2020-09-02 DIAGNOSIS — I5022 Chronic systolic (congestive) heart failure: Secondary | ICD-10-CM | POA: Diagnosis not present

## 2020-09-02 DIAGNOSIS — I482 Chronic atrial fibrillation, unspecified: Secondary | ICD-10-CM | POA: Diagnosis present

## 2020-09-02 DIAGNOSIS — N400 Enlarged prostate without lower urinary tract symptoms: Secondary | ICD-10-CM | POA: Diagnosis present

## 2020-09-02 DIAGNOSIS — Z79899 Other long term (current) drug therapy: Secondary | ICD-10-CM

## 2020-09-02 DIAGNOSIS — W010XXA Fall on same level from slipping, tripping and stumbling without subsequent striking against object, initial encounter: Secondary | ICD-10-CM | POA: Diagnosis present

## 2020-09-02 DIAGNOSIS — I959 Hypotension, unspecified: Secondary | ICD-10-CM | POA: Diagnosis not present

## 2020-09-02 DIAGNOSIS — W19XXXA Unspecified fall, initial encounter: Secondary | ICD-10-CM | POA: Diagnosis not present

## 2020-09-02 DIAGNOSIS — I4891 Unspecified atrial fibrillation: Secondary | ICD-10-CM | POA: Diagnosis present

## 2020-09-02 DIAGNOSIS — S199XXA Unspecified injury of neck, initial encounter: Secondary | ICD-10-CM | POA: Diagnosis not present

## 2020-09-02 DIAGNOSIS — S0990XA Unspecified injury of head, initial encounter: Secondary | ICD-10-CM | POA: Diagnosis not present

## 2020-09-02 DIAGNOSIS — Z9842 Cataract extraction status, left eye: Secondary | ICD-10-CM

## 2020-09-02 DIAGNOSIS — D649 Anemia, unspecified: Secondary | ICD-10-CM | POA: Diagnosis present

## 2020-09-02 DIAGNOSIS — I110 Hypertensive heart disease with heart failure: Secondary | ICD-10-CM | POA: Diagnosis present

## 2020-09-02 DIAGNOSIS — S0101XA Laceration without foreign body of scalp, initial encounter: Secondary | ICD-10-CM | POA: Diagnosis present

## 2020-09-02 DIAGNOSIS — I10 Essential (primary) hypertension: Secondary | ICD-10-CM | POA: Diagnosis present

## 2020-09-02 DIAGNOSIS — Z9841 Cataract extraction status, right eye: Secondary | ICD-10-CM

## 2020-09-02 DIAGNOSIS — Z23 Encounter for immunization: Secondary | ICD-10-CM | POA: Diagnosis not present

## 2020-09-02 DIAGNOSIS — R52 Pain, unspecified: Secondary | ICD-10-CM

## 2020-09-02 DIAGNOSIS — Z87891 Personal history of nicotine dependence: Secondary | ICD-10-CM

## 2020-09-02 DIAGNOSIS — S0191XA Laceration without foreign body of unspecified part of head, initial encounter: Secondary | ICD-10-CM | POA: Diagnosis not present

## 2020-09-02 DIAGNOSIS — C9210 Chronic myeloid leukemia, BCR/ABL-positive, not having achieved remission: Principal | ICD-10-CM | POA: Diagnosis present

## 2020-09-02 DIAGNOSIS — Z20822 Contact with and (suspected) exposure to covid-19: Secondary | ICD-10-CM | POA: Diagnosis not present

## 2020-09-02 DIAGNOSIS — R296 Repeated falls: Secondary | ICD-10-CM | POA: Diagnosis present

## 2020-09-02 DIAGNOSIS — M25561 Pain in right knee: Secondary | ICD-10-CM | POA: Diagnosis present

## 2020-09-02 DIAGNOSIS — S0003XA Contusion of scalp, initial encounter: Principal | ICD-10-CM

## 2020-09-02 DIAGNOSIS — Z961 Presence of intraocular lens: Secondary | ICD-10-CM | POA: Diagnosis present

## 2020-09-02 DIAGNOSIS — Z66 Do not resuscitate: Secondary | ICD-10-CM | POA: Diagnosis present

## 2020-09-02 DIAGNOSIS — Z85820 Personal history of malignant melanoma of skin: Secondary | ICD-10-CM

## 2020-09-02 DIAGNOSIS — D75839 Thrombocytosis, unspecified: Secondary | ICD-10-CM | POA: Diagnosis present

## 2020-09-02 DIAGNOSIS — G4489 Other headache syndrome: Secondary | ICD-10-CM | POA: Diagnosis not present

## 2020-09-02 LAB — CBC WITH DIFFERENTIAL/PLATELET
Abs Immature Granulocytes: 8.05 10*3/uL — ABNORMAL HIGH (ref 0.00–0.07)
Basophils Absolute: 0.8 10*3/uL — ABNORMAL HIGH (ref 0.0–0.1)
Basophils Relative: 2 %
Eosinophils Absolute: 1.2 10*3/uL — ABNORMAL HIGH (ref 0.0–0.5)
Eosinophils Relative: 2 %
HCT: 31.7 % — ABNORMAL LOW (ref 39.0–52.0)
Hemoglobin: 9 g/dL — ABNORMAL LOW (ref 13.0–17.0)
Immature Granulocytes: 16 %
Lymphocytes Relative: 4 %
Lymphs Abs: 1.9 10*3/uL (ref 0.7–4.0)
MCH: 25 pg — ABNORMAL LOW (ref 26.0–34.0)
MCHC: 28.4 g/dL — ABNORMAL LOW (ref 30.0–36.0)
MCV: 88.1 fL (ref 80.0–100.0)
Monocytes Absolute: 1.6 10*3/uL — ABNORMAL HIGH (ref 0.1–1.0)
Monocytes Relative: 3 %
Neutro Abs: 38.2 10*3/uL — ABNORMAL HIGH (ref 1.7–7.7)
Neutrophils Relative %: 73 %
Platelets: 591 10*3/uL — ABNORMAL HIGH (ref 150–400)
RBC: 3.6 MIL/uL — ABNORMAL LOW (ref 4.22–5.81)
RDW: 25.6 % — ABNORMAL HIGH (ref 11.5–15.5)
WBC: 51.7 10*3/uL (ref 4.0–10.5)
nRBC: 0.3 % — ABNORMAL HIGH (ref 0.0–0.2)

## 2020-09-02 LAB — BASIC METABOLIC PANEL
Anion gap: 11 (ref 5–15)
BUN: 26 mg/dL — ABNORMAL HIGH (ref 8–23)
CO2: 28 mmol/L (ref 22–32)
Calcium: 8.7 mg/dL — ABNORMAL LOW (ref 8.9–10.3)
Chloride: 101 mmol/L (ref 98–111)
Creatinine, Ser: 0.89 mg/dL (ref 0.61–1.24)
GFR, Estimated: >60 mL/min (ref >60)
Glucose, Bld: 122 mg/dL — ABNORMAL HIGH (ref 70–99)
Potassium: 4.8 mmol/L (ref 3.5–5.1)
Sodium: 140 mmol/L (ref 135–145)

## 2020-09-02 LAB — PROTIME-INR
INR: 1.1 (ref 0.8–1.2)
Prothrombin Time: 13.7 seconds (ref 11.4–15.2)

## 2020-09-02 MED ORDER — ONDANSETRON HCL 4 MG/2ML IJ SOLN: 4.0000 mg | Freq: Four times a day (QID) | INTRAMUSCULAR | Status: DC | PRN

## 2020-09-02 MED ORDER — SODIUM CHLORIDE 0.9 % IV BOLUS
1000.0000 mL | Freq: Once | INTRAVENOUS | Status: AC
  Administered 2020-09-02: 1000 mL via INTRAVENOUS

## 2020-09-02 MED ORDER — TAMSULOSIN HCL 0.4 MG PO CAPS
0.4000 mg | ORAL_CAPSULE | Freq: Every day | ORAL | Status: DC
  Administered 2020-09-03 – 2020-09-05 (×4): 0.4 mg via ORAL
  Filled 2020-09-02 (×4): qty 1

## 2020-09-02 MED ORDER — ONDANSETRON HCL 4 MG PO TABS: 4.0000 mg | ORAL_TABLET | Freq: Four times a day (QID) | ORAL | Status: DC | PRN

## 2020-09-02 MED ORDER — SILVER NITRATE-POT NITRATE 75-25 % EX MISC
1.0000 "application " | Freq: Once | CUTANEOUS | Status: AC
  Administered 2020-09-02: 1 via TOPICAL
  Filled 2020-09-02: qty 10

## 2020-09-02 MED ORDER — ACETAMINOPHEN 325 MG PO TABS
650.0000 mg | ORAL_TABLET | Freq: Four times a day (QID) | ORAL | Status: DC | PRN
  Administered 2020-09-05: 650 mg via ORAL
  Filled 2020-09-02: qty 2

## 2020-09-02 MED ORDER — METOPROLOL SUCCINATE ER 50 MG PO TB24
50.0000 mg | ORAL_TABLET | Freq: Every day | ORAL | Status: DC
  Filled 2020-09-02: qty 1

## 2020-09-02 MED ORDER — ACETAMINOPHEN 650 MG RE SUPP: 650.0000 mg | Freq: Four times a day (QID) | RECTAL | Status: DC | PRN

## 2020-09-02 NOTE — H&P (Signed)
History and Physical    William Ibarra SJG:[id number] DOB: [birth year] DOA: 09/02/2020  PCP: Shon Baton, MD  Patient coming from: Home  I have personally briefly reviewed patient's old medical records in [place]  Chief Complaint: Fall, hit head  HPI: William Ibarra is a 86 y.o. male with medical history significant of A.Fib not on blood thinners, BPH, HTN, leukocytosis w suspected CLL.  Pt presents to ED after slip and fall at home, hit head.  Pt lives alone in apartment.  Pt with profuse bleeding after fall and hematoma to back of head.  H/o large amount of bleeding with any minor trauma.  Never been evaluated for CLL.  No fevers, chills, CP, syncope.   ED Course: WBC 51k, HGB 9, was 45 and 11 at Dr. Keane Police office on last labs.  Pressure bandage applied by EDP.   Review of Systems: As per HPI, otherwise all review of systems negative.  Past Medical History:  Diagnosis Date  . Atrial fibrillation ([place])   . BPH (benign prostatic hypertrophy)   . Diabetes mellitus   . Diverticulosis of colon    from a prior colonoscopy  . History of blood transfusion ~ 10/2013; 10/24/2014   "related to blood thinners"  . Hydrocele   . Hydrocele   . Hypertension   . Hypotension 02/05/2012  . Impaired glucose tolerance   . Overactive bladder   . Psoriasis   . Rosacea   . Spermatocele     Past Surgical History:  Procedure Laterality Date  . CATARACT EXTRACTION W/ INTRAOCULAR LENS  IMPLANT, BILATERAL Bilateral    "successful in left eye; redid right"  . HYDROCELE EXCISION / REPAIR    . MELANOMA EXCISION Right    eye  . TONSILLECTOMY       reports that he quit smoking about 28 years ago. His smoking use included cigarettes. He has a 15.00 pack-year smoking history. He has never used smokeless tobacco. He reports current alcohol use of about 7.0 standard drinks of alcohol per week. He reports that he does not use drugs.  No Known Allergies  Family History    Problem Relation Age of Onset  . Bleeding Disorder Neg Hx      Prior to Admission medications   Medication Sig Start Date End Date Taking? Authorizing Provider  furosemide (LASIX) 20 MG tablet Take 20 mg by mouth daily. 10/08/19  Yes [provider]  losartan-hydrochlorothiazide (HYZAAR) 100-25 MG tablet Take 1 tablet by mouth daily.  02/04/14  Yes [provider]  metoprolol succinate (TOPROL-XL) 50 MG 24 hr tablet Take 50 mg by mouth daily. .   Yes [provider]  potassium chloride (MICRO-K) 10 MEQ CR capsule Take 10 mEq by mouth daily.  09/19/16  Yes [provider]  tamsulosin (FLOMAX) 0.4 MG CAPS capsule Take 1 capsule (0.4 mg total) by mouth at bedtime. 10/27/14  Yes Shon Baton, MD  acetaminophen (TYLENOL) 325 MG tablet Take 2 tablets (650 mg total) by mouth every 6 (six) hours as needed for mild pain, moderate pain, fever or headache (or Fever >/= 101). 10/27/14   Shon Baton, MD  atenolol (TENORMIN) 25 MG tablet Take 0.5 tablets (12.5 mg total) by mouth daily. 10/27/14   Shon Baton, MD    Physical Exam: Vitals:   09/02/20 2004 09/02/20 2032 09/02/20 2200  BP:  131/77 (!) 120/52  Pulse:  74   Resp:  20 (!) 21  Temp:  97.8 F (36.6 C)  TempSrc: Oral Oral   SpO2:  100%   Weight:  88.5 kg   Height:  6\' 1"  (1.854 m)     Constitutional: NAD, calm, comfortable Eyes: PERRL, lids and conjunctivae normal ENMT: Mucous membranes are moist. Posterior pharynx clear of any exudate or lesions.Normal dentition.  Neck: normal, supple, no masses, no thyromegaly Respiratory: clear to auscultation bilaterally, no wheezing, no crackles. Normal respiratory effort. No accessory muscle use.  Cardiovascular: Regular rate and rhythm, no murmurs / rubs / gallops. No extremity edema. 2+ pedal pulses. No carotid bruits.  Abdomen: no tenderness, no masses palpated. No hepatosplenomegaly. Bowel sounds positive.  Musculoskeletal: no clubbing / cyanosis. No joint  deformity upper and lower extremities. Good ROM, no contractures. Normal muscle tone.  Skin: Head wrapped under bandage. Neurologic: CN 2-12 grossly intact. Sensation intact, DTR normal. Strength 5/5 in all 4.  Psychiatric: Normal judgment and insight. Alert and oriented x 3. Normal mood.    Labs on Admission: I have personally reviewed following labs and imaging studies  CBC: Recent Labs  Lab 09/02/20 1925  WBC 51.7*  NEUTROABS 38.2*  HGB 9.0*  HCT 31.7*  MCV 88.1  PLT 916*   Basic Metabolic Panel: Recent Labs  Lab 09/02/20 1925  NA 140  K 4.8  CL 101  CO2 28  GLUCOSE 122*  BUN 26*  CREATININE 0.89  CALCIUM 8.7*   GFR: Estimated Creatinine Clearance: 57.4 mL/min (by C-G formula based on SCr of 0.89 mg/dL). Liver Function Tests: No results for input(s): AST, ALT, ALKPHOS, BILITOT, PROT, ALBUMIN in the last 168 hours. No results for input(s): LIPASE, AMYLASE in the last 168 hours. No results for input(s): AMMONIA in the last 168 hours. Coagulation Profile: Recent Labs  Lab 09/02/20 1925  INR 1.1   Cardiac Enzymes: No results for input(s): CKTOTAL, CKMB, CKMBINDEX, TROPONINI in the last 168 hours. BNP (last 3 results) No results for input(s): PROBNP in the last 8760 hours. HbA1C: No results for input(s): HGBA1C in the last 72 hours. CBG: No results for input(s): GLUCAP in the last 168 hours. Lipid Profile: No results for input(s): CHOL, HDL, LDLCALC, TRIG, CHOLHDL, LDLDIRECT in the last 72 hours. Thyroid Function Tests: No results for input(s): TSH, T4TOTAL, FREET4, T3FREE, THYROIDAB in the last 72 hours. Anemia Panel: No results for input(s): VITAMINB12, FOLATE, FERRITIN, TIBC, IRON, RETICCTPCT in the last 72 hours. Urine analysis:    Component Value Date/Time   COLORURINE AMBER (A) 10/26/2014 1727   APPEARANCEUR CLEAR 10/26/2014 1727   LABSPEC 1.020 10/26/2014 1727   PHURINE 5.5 10/26/2014 1727   GLUCOSEU NEGATIVE 10/26/2014 1727   HGBUR LARGE (A)  10/26/2014 1727   BILIRUBINUR NEGATIVE 10/26/2014 1727   KETONESUR NEGATIVE 10/26/2014 1727   PROTEINUR NEGATIVE 10/26/2014 1727   UROBILINOGEN 1.0 10/26/2014 1727   NITRITE NEGATIVE 10/26/2014 1727   LEUKOCYTESUR SMALL (A) 10/26/2014 1727    Radiological Exams on Admission: DG Chest 1 View  Result Date: 09/02/2020 CLINICAL DATA:  Status post fall. EXAM: CHEST  1 VIEW COMPARISON:  June 08, 2015 FINDINGS: Cardiomediastinal silhouette is normal. Mediastinal contours appear intact. Mild calcific atherosclerotic disease of the aorta. There is no evidence of focal airspace consolidation, pleural effusion or pneumothorax. Osseous structures are without acute abnormality. Soft tissues are grossly normal. IMPRESSION: No active disease. Electronically Signed   By: Fidela Salisbury M.D.   On: 09/02/2020 19:53   DG Pelvis 1-2 Views  Result Date: 09/02/2020 CLINICAL DATA:  Fall EXAM: PELVIS - 1-2 VIEW COMPARISON:  None. FINDINGS: There is no evidence of pelvic fracture or diastasis. No pelvic bone lesions are seen. IMPRESSION: Negative. Electronically Signed   By: Rolm Baptise M.D.   On: 09/02/2020 19:55   DG Tibia/Fibula Left  Result Date: 09/02/2020 CLINICAL DATA:  Fall EXAM: LEFT TIBIA AND FIBULA - 2 VIEW COMPARISON:  None. FINDINGS: No acute bony abnormality. Specifically, no fracture, subluxation, or dislocation. No radiopaque foreign body. IMPRESSION: No acute bony abnormality. Electronically Signed   By: Rolm Baptise M.D.   On: 09/02/2020 19:57   CT Head Wo Contrast  Result Date: 09/02/2020 CLINICAL DATA:  Fall, hit head EXAM: CT HEAD WITHOUT CONTRAST TECHNIQUE: Contiguous axial images were obtained from the base of the skull through the vertex without intravenous contrast. COMPARISON:  None. FINDINGS: Brain: There is atrophy and chronic small vessel disease changes. No acute intracranial abnormality. Specifically, no hemorrhage, hydrocephalus, mass lesion, acute infarction, or significant  intracranial injury. Vascular: No hyperdense vessel or unexpected calcification. Skull: No acute calvarial abnormality. Sinuses/Orbits: Visualized paranasal sinuses and mastoids clear. Orbital soft tissues unremarkable. Other: Soft tissue swelling posterior scalp. IMPRESSION: Atrophy, chronic microvascular disease. No acute intracranial abnormality. Electronically Signed   By: Rolm Baptise M.D.   On: 09/02/2020 20:28   CT Cervical Spine Wo Contrast  Result Date: 09/02/2020 CLINICAL DATA:  Fall, hit back of head. EXAM: CT CERVICAL SPINE WITHOUT CONTRAST TECHNIQUE: Multidetector CT imaging of the cervical spine was performed without intravenous contrast. Multiplanar CT image reconstructions were also generated. COMPARISON:  None. FINDINGS: Alignment: Slight degenerative anterolisthesis of C2 on C3 and C7 on T1. Skull base and vertebrae: No acute fracture. No primary bone lesion or focal pathologic process. Soft tissues and spinal canal: No prevertebral fluid or swelling. No visible canal hematoma. Disc levels: Diffuse advanced degenerative disc disease. Mild bilateral degenerative facet disease. Upper chest: No acute findings Other: Bilateral carotid artery calcifications. IMPRESSION: Degenerative disc and facet disease.  No acute bony abnormality. Electronically Signed   By: Rolm Baptise M.D.   On: 09/02/2020 20:28    EKG: Independently reviewed.  Assessment/Plan Principal Problem:   Anemia Active Problems:   Atrial fibrillation (HCC)   HTN (hypertension)   CLL (chronic lymphocytic leukemia) (HCC)   Laceration of head    1. Anemia - presumably acute blood loss from head lac and hematoma causing HGB drop 1. Bleeding controlled in ED 2. Repeat CBC in AM 3. PT/OT eval 4. SW consult for possible SNF placement / stay / discuss with daughter 2. Leukocytosis, suspected CLL - 1. Save smear 2. Dr. Jana Hakim consulted by EDP, they will see in AM 3. H/o A.fib - 1. Cont metoprolol 2. Not on  anticoagulation 4. HTN - 1. Cont metoprolol 2. Cont Flomax 3. Due to volume loss (bleeding) will plan on holding Lasix and Hyzaar for tomorrow.  DVT prophylaxis: SCDs Code Status: DNR - confirmed with patient Family Communication: Daughter at bedside Disposition Plan: TBD, home vs SNF tomorrow Consults called: EDP spoke with Dr. Jana Hakim Admission status: Place in [place]   Grainger Mccarley, [place] [facility]  How to contact the [facility] Attending or Consulting provider [place] or covering provider during after hours [place], for this patient?  1. Check the care team in [facility] and look for a) attending/consulting [facility] provider listed and b) the [facility] team listed 2. Log into [email or web address]  Amion Physician Scheduling and messaging for groups and whole hospitals  On call and physician scheduling software for group practices, residents,  hospitalists and other medical providers for call, clinic, rotation and shift schedules. OnCall [employer] is a hospital-wide system for scheduling doctors and paging doctors on call. EasyPlot is for scientific plotting and data analysis.  [email or web address]  and use [place]'s universal password to access. If you do not have the password, please contact the hospital operator.  3. Locate the [facility] provider you are looking for under [facility] and page to a number that you can be directly reached. 4. If you still have difficulty reaching the provider, please page the [facility] (Director on Call) for the Hospitalists listed on amion for assistance.  09/02/2020, 10:21 PM

## 2020-09-02 NOTE — ED Provider Notes (Signed)
[facility] DEPT Provider Note   CSN: [id number] Arrival date & time: 09/02/20  1803     History No chief complaint on file.   William Ibarra is a 86 y.o. male hx of afib not on blood thinners, diabetes, hypertension, CLL here presenting with fall. Patient states that he may have slipped and fell and hit his head. Patient was noted to have a hematoma in the back of his head. He also has some skin tears in his leg. He was noted to have profuse bleeding. Patient is not currently on blood thinners. Patient states that he has CLL and has been having a lot of bleeding whenever he has a minor cut. He states that he is not undergoing any treatments currently for CLL due to age. Tdap is up to date    The history is provided by the patient.       Past Medical History:  Diagnosis Date  . Atrial fibrillation ([place])   . BPH (benign prostatic hypertrophy)   . Diabetes mellitus   . Diverticulosis of colon    from a prior colonoscopy  . History of blood transfusion ~ 10/2013; 10/24/2014   "related to blood thinners"  . Hydrocele   . Hydrocele   . Hypertension   . Hypotension 02/05/2012  . Impaired glucose tolerance   . Overactive bladder   . Psoriasis   . Rosacea   . Spermatocele     Patient Active Problem List   Diagnosis Date Noted  . Hematoma 10/24/2014  . Hypotension 10/24/2014  . Anemia 02/06/2012    Class: Acute  . Hypotension 02/05/2012    Class: Acute  . Anticoagulation excessive 02/05/2012    Class: Acute  . Diabetes mellitus type 2, controlled ([place]) 02/05/2012    Class: Acute  . Atrial fibrillation ([place]) 02/05/2012    Class: Chronic  . Psoriasis 02/05/2012    Class: Chronic    Past Surgical History:  Procedure Laterality Date  . CATARACT EXTRACTION W/ INTRAOCULAR LENS  IMPLANT, BILATERAL Bilateral    "successful in left eye; redid right"  . HYDROCELE EXCISION / REPAIR    . MELANOMA EXCISION Right    eye  . TONSILLECTOMY          No family history on file.  Social History   Tobacco Use  . Smoking status: Former Smoker    Packs/day: 0.50    Years: 30.00    Pack years: 15.00    Types: Cigarettes    Quit date: 02/05/1992    Years since quitting: 28.5  . Smokeless tobacco: Never Used  Substance Use Topics  . Alcohol use: Yes    Alcohol/week: 7.0 standard drinks    Types: 7 Glasses of wine per week  . Drug use: No    Home Medications Prior to Admission medications   Medication Sig Start Date End Date Taking? Authorizing Provider  acetaminophen (TYLENOL) 325 MG tablet Take 2 tablets (650 mg total) by mouth every 6 (six) hours as needed for mild pain, moderate pain, fever or headache (or Fever >/= 101). 10/27/14   Shon Baton, MD  atenolol (TENORMIN) 25 MG tablet Take 0.5 tablets (12.5 mg total) by mouth daily. 10/27/14   Shon Baton, MD  furosemide (LASIX) 20 MG tablet Take 20 mg by mouth daily. 10/08/19   [provider]  losartan-hydrochlorothiazide (HYZAAR) 100-25 MG tablet Take by mouth. 02/04/14   [provider]  potassium chloride (MICRO-K) 10 MEQ CR capsule  09/19/16  [provider]  tamsulosin (FLOMAX) 0.4 MG CAPS capsule Take 1 capsule (0.4 mg total) by mouth at bedtime. 10/27/14   Shon Baton, MD    Allergies    Patient has no known allergies.  Review of Systems   Review of Systems  Skin: Positive for wound.  Hematological: Bruises/bleeds easily.  All other systems reviewed and are negative.   Physical Exam Updated Vital Signs BP 131/77 (BP Location: Left Arm)   Pulse 74   Temp 97.8 F (36.6 C) (Oral)   Resp 20   Ht 6\' 1"  (1.854 m)   Wt 88.5 kg   SpO2 100%   BMI 25.73 kg/m   Physical Exam Vitals and nursing note reviewed.  HENT:     Head:     Comments: 1 cm posterior scalp laceration. There is golf ball size hematoma. There is active bleeding.    Nose: Nose normal.     Mouth/Throat:     Mouth: Mucous membranes are moist.  Eyes:      Extraocular Movements: Extraocular movements intact.     Pupils: Pupils are equal, round, and reactive to light.  Cardiovascular:     Rate and Rhythm: Normal rate and regular rhythm.     Pulses: Normal pulses.     Heart sounds: Normal heart sounds.  Pulmonary:     Effort: Pulmonary effort is normal.     Breath sounds: Normal breath sounds.  Abdominal:     General: Abdomen is flat.     Palpations: Abdomen is soft.  Musculoskeletal:     Cervical back: Normal range of motion.     Comments: Skin tear in the left shin area that is new. He has some old skin tears in the left arm and left hand area  Skin:    General: Skin is warm.     Capillary Refill: Capillary refill takes less than 2 seconds.  Neurological:     General: No focal deficit present.     Mental Status: He is oriented to person, place, and time.     Cranial Nerves: No cranial nerve deficit.     Sensory: No sensory deficit.     Motor: No weakness.     Coordination: Coordination normal.  Psychiatric:        Mood and Affect: Mood normal.        Behavior: Behavior normal.     ED Results / Procedures / Treatments   Labs (all labs ordered are listed, but only abnormal results are displayed) Labs Reviewed  CBC WITH DIFFERENTIAL/PLATELET - Abnormal; Notable for the following components:      Result Value   WBC 51.7 (*)    RBC 3.60 (*)    Hemoglobin 9.0 (*)    HCT 31.7 (*)    MCH 25.0 (*)    MCHC 28.4 (*)    RDW 25.6 (*)    Platelets 591 (*)    nRBC 0.3 (*)    Neutro Abs 38.2 (*)    Monocytes Absolute 1.6 (*)    Eosinophils Absolute 1.2 (*)    Basophils Absolute 0.8 (*)    Abs Immature Granulocytes 8.05 (*)    All other components within normal limits  BASIC METABOLIC PANEL - Abnormal; Notable for the following components:   Glucose, Bld 122 (*)    BUN 26 (*)    Calcium 8.7 (*)    All other components within normal limits  PROTIME-INR    EKG None  Radiology DG Chest 1 View  Result Date:  09/02/2020 CLINICAL DATA:  Status post fall. EXAM: CHEST  1 VIEW COMPARISON:  June 08, 2015 FINDINGS: Cardiomediastinal silhouette is normal. Mediastinal contours appear intact. Mild calcific atherosclerotic disease of the aorta. There is no evidence of focal airspace consolidation, pleural effusion or pneumothorax. Osseous structures are without acute abnormality. Soft tissues are grossly normal. IMPRESSION: No active disease. Electronically Signed   By: Fidela Salisbury M.D.   On: 09/02/2020 19:53   DG Pelvis 1-2 Views  Result Date: 09/02/2020 CLINICAL DATA:  Fall EXAM: PELVIS - 1-2 VIEW COMPARISON:  None. FINDINGS: There is no evidence of pelvic fracture or diastasis. No pelvic bone lesions are seen. IMPRESSION: Negative. Electronically Signed   By: Rolm Baptise M.D.   On: 09/02/2020 19:55   DG Tibia/Fibula Left  Result Date: 09/02/2020 CLINICAL DATA:  Fall EXAM: LEFT TIBIA AND FIBULA - 2 VIEW COMPARISON:  None. FINDINGS: No acute bony abnormality. Specifically, no fracture, subluxation, or dislocation. No radiopaque foreign body. IMPRESSION: No acute bony abnormality. Electronically Signed   By: Rolm Baptise M.D.   On: 09/02/2020 19:57   CT Head Wo Contrast  Result Date: 09/02/2020 CLINICAL DATA:  Fall, hit head EXAM: CT HEAD WITHOUT CONTRAST TECHNIQUE: Contiguous axial images were obtained from the base of the skull through the vertex without intravenous contrast. COMPARISON:  None. FINDINGS: Brain: There is atrophy and chronic small vessel disease changes. No acute intracranial abnormality. Specifically, no hemorrhage, hydrocephalus, mass lesion, acute infarction, or significant intracranial injury. Vascular: No hyperdense vessel or unexpected calcification. Skull: No acute calvarial abnormality. Sinuses/Orbits: Visualized paranasal sinuses and mastoids clear. Orbital soft tissues unremarkable. Other: Soft tissue swelling posterior scalp. IMPRESSION: Atrophy, chronic microvascular disease.  No acute intracranial abnormality. Electronically Signed   By: Rolm Baptise M.D.   On: 09/02/2020 20:28   CT Cervical Spine Wo Contrast  Result Date: 09/02/2020 CLINICAL DATA:  Fall, hit back of head. EXAM: CT CERVICAL SPINE WITHOUT CONTRAST TECHNIQUE: Multidetector CT imaging of the cervical spine was performed without intravenous contrast. Multiplanar CT image reconstructions were also generated. COMPARISON:  None. FINDINGS: Alignment: Slight degenerative anterolisthesis of C2 on C3 and C7 on T1. Skull base and vertebrae: No acute fracture. No primary bone lesion or focal pathologic process. Soft tissues and spinal canal: No prevertebral fluid or swelling. No visible canal hematoma. Disc levels: Diffuse advanced degenerative disc disease. Mild bilateral degenerative facet disease. Upper chest: No acute findings Other: Bilateral carotid artery calcifications. IMPRESSION: Degenerative disc and facet disease.  No acute bony abnormality. Electronically Signed   By: Rolm Baptise M.D.   On: 09/02/2020 20:28    Procedures Procedures (including critical care time)  LACERATION REPAIR Performed by: Wandra Arthurs Authorized by: Wandra Arthurs Consent: Verbal consent obtained. Risks and benefits: risks, benefits and alternatives were discussed Consent given by: patient Patient identity confirmed: provided demographic data Prepped and Draped in normal sterile fashion Wound explored  Laceration Location: Posterior scalp  Laceration Length: 1 cm with surrounding hematoma  No Foreign Bodies seen or palpated  Anesthesia: local infiltration  Local anesthetic: none  Irrigation method: syringe Amount of cleaning: standard  Skin closure: staples  Number of staples: 3   Patient tolerance: Patient tolerated the procedure well with no immediate complications.   Medications Ordered in ED Medications  silver nitrate applicators applicator 1 application (has no administration in time range)  sodium  chloride 0.9 % bolus 1,000 mL (1,000 mLs Intravenous New Bag/Given 09/02/20 2023)    ED  Course  I have reviewed the triage vital signs and the nursing notes.  Pertinent labs & imaging results that were available during my care of the patient were reviewed by me and considered in my medical decision making (see chart for details).    MDM Rules/Calculators/A&P                         William Ibarra is a 86 y.o. male here presenting with mechanical fall. Patient has a small scalp laceration with a hematoma but has been bleeding profusely. Initially try some staples but he has persistent bleeding so I had to try wound seal as well as silver nitrate and combat gauze and finally a compression dressing. I'm concerned that he may have an underlying bleeding disorder and will get a CBC as well as CT head and neck.  9:49 PM White blood cell count is 51. His hemoglobin is 9. Daughter was able to show me a copy of his labs from Dr. Keane Police office. Patient apparently has hemoglobin of 11 at baseline and white blood cell count of 45. He has a presumed diagnosis of CLL. However he has never seen a oncologist before. I talked to Dr. Jana Hakim from oncology. He recommend saving a smear and he will see patient on this consult tomorrow. Given that he has a hemoglobin drop and possible new diagnosis of leukemia, will admit for observation.   Final Clinical Impression(s) / ED Diagnoses Final diagnoses:  None    Rx / DC Orders ED Discharge Orders    None       Drenda Freeze, MD 09/02/20 2202

## 2020-09-02 NOTE — ED Triage Notes (Signed)
The patient presents from [facility] post fall. While at the facility the patient fell and hit his head on the corner of the wall. Due to the impact with the wall, he now has a gash and a hematoma to the back of his head. The patient also has a skin tear on the left lower leg. There was no loss of consciousness and the patient remembers the event. Denies the use of blood thinners.     EMS vitals: 158/70 65 HR 98.5 197

## 2020-09-03 ENCOUNTER — Observation Stay (HOSPITAL_COMMUNITY): Payer: Medicare Other

## 2020-09-03 ENCOUNTER — Encounter (HOSPITAL_COMMUNITY): Payer: Self-pay | Admitting: Internal Medicine

## 2020-09-03 DIAGNOSIS — M25461 Effusion, right knee: Secondary | ICD-10-CM | POA: Diagnosis not present

## 2020-09-03 DIAGNOSIS — R2681 Unsteadiness on feet: Secondary | ICD-10-CM | POA: Diagnosis not present

## 2020-09-03 DIAGNOSIS — Z20822 Contact with and (suspected) exposure to covid-19: Secondary | ICD-10-CM | POA: Diagnosis present

## 2020-09-03 DIAGNOSIS — M6281 Muscle weakness (generalized): Secondary | ICD-10-CM | POA: Diagnosis not present

## 2020-09-03 DIAGNOSIS — D75839 Thrombocytosis, unspecified: Secondary | ICD-10-CM | POA: Diagnosis present

## 2020-09-03 DIAGNOSIS — R531 Weakness: Secondary | ICD-10-CM | POA: Diagnosis not present

## 2020-09-03 DIAGNOSIS — S0101XA Laceration without foreign body of scalp, initial encounter: Secondary | ICD-10-CM | POA: Diagnosis present

## 2020-09-03 DIAGNOSIS — I4891 Unspecified atrial fibrillation: Secondary | ICD-10-CM | POA: Diagnosis not present

## 2020-09-03 DIAGNOSIS — Z85820 Personal history of malignant melanoma of skin: Secondary | ICD-10-CM | POA: Diagnosis not present

## 2020-09-03 DIAGNOSIS — R2689 Other abnormalities of gait and mobility: Secondary | ICD-10-CM | POA: Diagnosis not present

## 2020-09-03 DIAGNOSIS — Z9842 Cataract extraction status, left eye: Secondary | ICD-10-CM | POA: Diagnosis not present

## 2020-09-03 DIAGNOSIS — R296 Repeated falls: Secondary | ICD-10-CM | POA: Diagnosis present

## 2020-09-03 DIAGNOSIS — I10 Essential (primary) hypertension: Secondary | ICD-10-CM | POA: Diagnosis not present

## 2020-09-03 DIAGNOSIS — L409 Psoriasis, unspecified: Secondary | ICD-10-CM | POA: Diagnosis present

## 2020-09-03 DIAGNOSIS — Z7401 Bed confinement status: Secondary | ICD-10-CM | POA: Diagnosis not present

## 2020-09-03 DIAGNOSIS — C9210 Chronic myeloid leukemia, BCR/ABL-positive, not having achieved remission: Secondary | ICD-10-CM

## 2020-09-03 DIAGNOSIS — M25561 Pain in right knee: Secondary | ICD-10-CM | POA: Diagnosis present

## 2020-09-03 DIAGNOSIS — Y92009 Unspecified place in unspecified non-institutional (private) residence as the place of occurrence of the external cause: Secondary | ICD-10-CM | POA: Diagnosis not present

## 2020-09-03 DIAGNOSIS — Z23 Encounter for immunization: Secondary | ICD-10-CM | POA: Diagnosis not present

## 2020-09-03 DIAGNOSIS — S0191XA Laceration without foreign body of unspecified part of head, initial encounter: Secondary | ICD-10-CM | POA: Diagnosis not present

## 2020-09-03 DIAGNOSIS — R52 Pain, unspecified: Secondary | ICD-10-CM | POA: Diagnosis not present

## 2020-09-03 DIAGNOSIS — C9110 Chronic lymphocytic leukemia of B-cell type not having achieved remission: Secondary | ICD-10-CM | POA: Diagnosis not present

## 2020-09-03 DIAGNOSIS — M2550 Pain in unspecified joint: Secondary | ICD-10-CM | POA: Diagnosis not present

## 2020-09-03 DIAGNOSIS — M1711 Unilateral primary osteoarthritis, right knee: Secondary | ICD-10-CM | POA: Diagnosis not present

## 2020-09-03 DIAGNOSIS — W010XXA Fall on same level from slipping, tripping and stumbling without subsequent striking against object, initial encounter: Secondary | ICD-10-CM | POA: Diagnosis present

## 2020-09-03 DIAGNOSIS — I959 Hypotension, unspecified: Secondary | ICD-10-CM | POA: Diagnosis not present

## 2020-09-03 DIAGNOSIS — S0191XD Laceration without foreign body of unspecified part of head, subsequent encounter: Secondary | ICD-10-CM | POA: Diagnosis not present

## 2020-09-03 DIAGNOSIS — S0100XA Unspecified open wound of scalp, initial encounter: Secondary | ICD-10-CM | POA: Diagnosis not present

## 2020-09-03 DIAGNOSIS — N400 Enlarged prostate without lower urinary tract symptoms: Secondary | ICD-10-CM | POA: Diagnosis present

## 2020-09-03 DIAGNOSIS — Z79899 Other long term (current) drug therapy: Secondary | ICD-10-CM | POA: Diagnosis not present

## 2020-09-03 DIAGNOSIS — I5022 Chronic systolic (congestive) heart failure: Secondary | ICD-10-CM | POA: Diagnosis present

## 2020-09-03 DIAGNOSIS — Z9181 History of falling: Secondary | ICD-10-CM | POA: Diagnosis not present

## 2020-09-03 DIAGNOSIS — D62 Acute posthemorrhagic anemia: Secondary | ICD-10-CM | POA: Diagnosis present

## 2020-09-03 DIAGNOSIS — M25551 Pain in right hip: Secondary | ICD-10-CM | POA: Diagnosis not present

## 2020-09-03 DIAGNOSIS — I482 Chronic atrial fibrillation, unspecified: Secondary | ICD-10-CM | POA: Diagnosis present

## 2020-09-03 DIAGNOSIS — Z66 Do not resuscitate: Secondary | ICD-10-CM | POA: Diagnosis present

## 2020-09-03 DIAGNOSIS — R410 Disorientation, unspecified: Secondary | ICD-10-CM | POA: Diagnosis not present

## 2020-09-03 DIAGNOSIS — Z87891 Personal history of nicotine dependence: Secondary | ICD-10-CM | POA: Diagnosis not present

## 2020-09-03 DIAGNOSIS — Z823 Family history of stroke: Secondary | ICD-10-CM | POA: Diagnosis not present

## 2020-09-03 DIAGNOSIS — Z9841 Cataract extraction status, right eye: Secondary | ICD-10-CM | POA: Diagnosis not present

## 2020-09-03 DIAGNOSIS — Z961 Presence of intraocular lens: Secondary | ICD-10-CM | POA: Diagnosis present

## 2020-09-03 DIAGNOSIS — I110 Hypertensive heart disease with heart failure: Secondary | ICD-10-CM | POA: Diagnosis present

## 2020-09-03 DIAGNOSIS — S8991XA Unspecified injury of right lower leg, initial encounter: Secondary | ICD-10-CM | POA: Diagnosis not present

## 2020-09-03 DIAGNOSIS — E119 Type 2 diabetes mellitus without complications: Secondary | ICD-10-CM | POA: Diagnosis present

## 2020-09-03 DIAGNOSIS — D649 Anemia, unspecified: Secondary | ICD-10-CM | POA: Diagnosis not present

## 2020-09-03 DIAGNOSIS — S0003XA Contusion of scalp, initial encounter: Secondary | ICD-10-CM | POA: Diagnosis not present

## 2020-09-03 DIAGNOSIS — R41841 Cognitive communication deficit: Secondary | ICD-10-CM | POA: Diagnosis not present

## 2020-09-03 LAB — BASIC METABOLIC PANEL
Anion gap: 12 (ref 5–15)
BUN: 21 mg/dL (ref 8–23)
CO2: 26 mmol/L (ref 22–32)
Calcium: 8.5 mg/dL — ABNORMAL LOW (ref 8.9–10.3)
Chloride: 104 mmol/L (ref 98–111)
Creatinine, Ser: 0.89 mg/dL (ref 0.61–1.24)
GFR, Estimated: >60 mL/min (ref >60)
Glucose, Bld: 109 mg/dL — ABNORMAL HIGH (ref 70–99)
Potassium: 3.5 mmol/L (ref 3.5–5.1)
Sodium: 142 mmol/L (ref 135–145)

## 2020-09-03 LAB — CBC
HCT: 26.5 % — ABNORMAL LOW (ref 39.0–52.0)
Hemoglobin: 7.5 g/dL — ABNORMAL LOW (ref 13.0–17.0)
MCH: 25.1 pg — ABNORMAL LOW (ref 26.0–34.0)
MCHC: 28.3 g/dL — ABNORMAL LOW (ref 30.0–36.0)
MCV: 88.6 fL (ref 80.0–100.0)
Platelets: 525 10*3/uL — ABNORMAL HIGH (ref 150–400)
RBC: 2.99 MIL/uL — ABNORMAL LOW (ref 4.22–5.81)
RDW: 25.6 % — ABNORMAL HIGH (ref 11.5–15.5)
WBC: 41.9 10*3/uL — ABNORMAL HIGH (ref 4.0–10.5)
nRBC: 0.3 % — ABNORMAL HIGH (ref 0.0–0.2)

## 2020-09-03 LAB — RESPIRATORY PANEL BY RT PCR (FLU A&B, COVID)
Influenza A by PCR: NEGATIVE
Influenza B by PCR: NEGATIVE
SARS Coronavirus 2 by RT PCR: NEGATIVE

## 2020-09-03 LAB — SAVE SMEAR(SSMR), FOR PROVIDER SLIDE REVIEW

## 2020-09-03 MED ORDER — POTASSIUM CHLORIDE CRYS ER 20 MEQ PO TBCR
40.0000 meq | EXTENDED_RELEASE_TABLET | Freq: Once | ORAL | Status: AC
  Administered 2020-09-03: 40 meq via ORAL
  Filled 2020-09-03: qty 2

## 2020-09-03 MED ORDER — INFLUENZA VAC A&B SA ADJ QUAD 0.5 ML IM PRSY
0.5000 mL | PREFILLED_SYRINGE | INTRAMUSCULAR | Status: DC
  Filled 2020-09-03: qty 0.5

## 2020-09-03 MED ORDER — ALLOPURINOL 300 MG PO TABS
150.0000 mg | ORAL_TABLET | Freq: Every day | ORAL | Status: DC
  Administered 2020-09-03 – 2020-09-06 (×4): 150 mg via ORAL
  Filled 2020-09-03 (×4): qty 1

## 2020-09-03 MED ORDER — IMATINIB MESYLATE 100 MG PO TABS
200.0000 mg | ORAL_TABLET | Freq: Every day | ORAL | Status: DC
  Administered 2020-09-03 – 2020-09-06 (×4): 200 mg via ORAL
  Filled 2020-09-03 (×4): qty 2

## 2020-09-03 NOTE — TOC Initial Note (Signed)
Transition of Care [facility]) - Initial/Assessment Note    Patient Details  Name: William Ibarra MRN: [id number] Date of Birth: Nov 05, 1923  Transition of Care [facility]) CM/SW Contact:    Ova Freshwater Phone Number:  [phone] 09/03/2020, 11:24 AM  Clinical Narrative:                  CSW contacted and left voicemail for Antonia, [place] (Daughter) [phone] for collateral information and to discuss SNF placement.       Patient Goals and CMS Choice        Expected Discharge Plan and Services                                                Prior Living Arrangements/Services                       Activities of Daily Living Home Assistive Devices/Equipment: Bedside commode/3-in-1, Cane (specify quad or straight) ADL Screening (condition at time of admission) Patient's cognitive ability adequate to safely complete daily activities?: No Is the patient deaf or have difficulty hearing?: Yes Does the patient have difficulty seeing, even when wearing glasses/contacts?: Yes Does the patient have difficulty concentrating, remembering, or making decisions?: No Patient able to express need for assistance with ADLs?: Yes Does the patient have difficulty dressing or bathing?: Yes Independently performs ADLs?: Yes (appropriate for developmental age) Does the patient have difficulty walking or climbing stairs?: Yes Weakness of Legs: Both Weakness of Arms/Hands: Both  Permission Sought/Granted                  Emotional Assessment              Admission diagnosis:  CLL (chronic lymphocytic leukemia) ([place]) [C91.10] Laceration of head [S01.91XA] Scalp hematoma, initial encounter [S00.03XA] Patient Active Problem List   Diagnosis Date Noted  . Chronic myeloid leukemia ([place]) 09/03/2020  . HTN (hypertension) 09/02/2020  . Laceration of head 09/02/2020  . Hematoma 10/24/2014  . Hypotension 10/24/2014  . Anemia 02/06/2012    Class: Acute   . Hypotension 02/05/2012    Class: Acute  . Anticoagulation excessive 02/05/2012    Class: Acute  . Diabetes mellitus type 2, controlled ([place]) 02/05/2012    Class: Acute  . Atrial fibrillation ([place]) 02/05/2012    Class: Chronic  . Psoriasis 02/05/2012    Class: Chronic   PCP:  Shon Baton, MD Pharmacy:   [facility], [place] [address] Phone: [phone] Fax: [phone]     Social Determinants of Health (SDOH) Interventions    Readmission Risk Interventions No flowsheet data found.

## 2020-09-03 NOTE — Progress Notes (Signed)
Loosen dressing around chin no evidence of active bleeding. C/o headache but refused pain medication. Reassured.

## 2020-09-03 NOTE — Progress Notes (Signed)
PROGRESS NOTE  William Ibarra:[id number] DOB: [birth year] DOA: 09/02/2020 PCP: Shon Baton, MD  HPI/Recap of past 24 hours: William Ibarra is a 86 y.o. male with medical history significant of chronic A.Fib not on OAC, BPH, HTN, chronically elevated WBC and platelet count who presented from home after a mechanical fall.  He slipped and fell at home and hit his head.  He lives alone.  He had profuse bleeding after his fall and hematoma to the back of his head.  Per chart review he has a history of large amount of bleeding with any minor trauma.  He was in his usual state of health prior to that.  Pressure bandage was applied by EDP.  Hematology consulted for elevated WBC and platelet count.  TRH asked to admit.    ED Course: WBC 51k, HGB 9K, platelet count 591K.  Seen by hematology oncology, Dr. Jana Hakim, ongoing work-up for possible CML.  09/03/20: Seen with his daughter at bedside.  He wants the bandage removed, it is uncomfortable.  States he tripped on his toes and fell.  No prodrome symptoms.   Assessment/Plan: Principal Problem:   Anemia Active Problems:   Atrial fibrillation (HCC)   HTN (hypertension)   Laceration of head   Chronic myeloid leukemia (H[facility])  Head laceration and hematoma post mechanical fall at home CT head no acute intracranial findings. CT neck no acute bone abnormality. Pain control with analgesics  Suspected CML All 3 cell lines affected Presented with WBC 50 1K, hemoglobin 9K, platelet count 591K. Peripheral smear showing many immature myeloid cells, no blasts seen, consistent with chronic myeloid leukemia. Seen by hematology oncology Dr. Jana Hakim Ongoing work-up for possible CML, BCR/ABL pending, follow results. Started on [place] on 09/03/2020 We highly appreciate hematology oncology's assistance.  Acute blood loss anemia from head laceration and hematoma Presented with hemoglobin of 9, dropped to 7.5 this morning. MCV 88.6. Continue to  monitor H&H  Chronic A. fib not on [place] Rate controlled on Toprol-XL, currently on hold due to soft blood pressures Continue to closely monitor  Chronic systolic CHF Last 2D echo done on 06/19/2015 showed LVEF 40% with diffuse hypokinesis Start strict I's and O's and daily weight Home p.o. Lasix currently on hold due to soft Bps Closely monitor volume status  Essential hypertension BP is currently soft Hold off home oral antihypertensives Continue to monitor vital signs  BPH Monitor urine output Continue home Flomax.  Code Status: DNR  Family Communication: Daughter at bedside.  Disposition Plan: Possible SNF, pending PT OT evaluation.   Consultants:  Hematology oncology  Procedures:  None  Antimicrobials:  None  DVT prophylaxis: SCDs  Status is: Observation    Dispo:  Patient From: Home  Planned Disposition: [place]  Expected discharge date: 09/04/20  Medically stable for discharge: No, ongoing management of suspected CML.    Objective: Vitals:   09/03/20 0100 09/03/20 0257 09/03/20 0945 09/03/20 0949  BP: 118/89 (!) 125/50 (!) 111/50 (!) 114/47  Pulse:  75 85 69  Resp: (!) 23 20 17    Temp:  98.1 F (36.7 C) 99.3 F (37.4 C)   TempSrc:  Oral    SpO2:   93%   Weight:      Height:        Intake/Output Summary (Last 24 hours) at 09/03/2020 1041 Last data filed at 09/03/2020 0900 Gross per 24 hour  Intake --  Output 175 ml  Net -175 ml   [employer]  09/02/20 2032  Weight: 88.5 kg    Exam:  . General: 86 y.o. year-old male well developed well nourished in no acute distress.  Alert and interactive.  Very hard of hearing.  Head wrap in place. . Cardiovascular: Irregular rate and rhythm with no rubs or gallops.  No thyromegaly or JVD noted.   Marland Kitchen Respiratory: Clear to auscultation with no wheezes or rales. Good inspiratory effort. . Abdomen: Soft nontender nondistended with normal bowel sounds x4  quadrants. . Musculoskeletal: No lower extremity edema. 2/4 pulses in all 4 extremities. Marland Kitchen Psychiatry: Mood is appropriate for condition and setting   Data Reviewed: CBC: Recent Labs  Lab 09/02/20 1925 09/03/20 0519  WBC 51.7* 41.9*  NEUTROABS 38.2*  --   HGB 9.0* 7.5*  HCT 31.7* 26.5*  MCV 88.1 88.6  PLT 591* 973*   Basic Metabolic Panel: Recent Labs  Lab 09/02/20 1925 09/03/20 0519  NA 140 142  K 4.8 3.5  CL 101 104  CO2 28 26  GLUCOSE 122* 109*  BUN 26* 21  CREATININE 0.89 0.89  CALCIUM 8.7* 8.5*   GFR: Estimated Creatinine Clearance: 57.4 mL/min (by C-G formula based on SCr of 0.89 mg/dL). Liver Function Tests: No results for input(s): AST, ALT, ALKPHOS, BILITOT, PROT, ALBUMIN in the last 168 hours. No results for input(s): LIPASE, AMYLASE in the last 168 hours. No results for input(s): AMMONIA in the last 168 hours. Coagulation Profile: Recent Labs  Lab 09/02/20 1925  INR 1.1   Cardiac Enzymes: No results for input(s): CKTOTAL, CKMB, CKMBINDEX, TROPONINI in the last 168 hours. BNP (last 3 results) No results for input(s): PROBNP in the last 8760 hours. HbA1C: No results for input(s): HGBA1C in the last 72 hours. CBG: No results for input(s): GLUCAP in the last 168 hours. Lipid Profile: No results for input(s): CHOL, HDL, LDLCALC, TRIG, CHOLHDL, LDLDIRECT in the last 72 hours. Thyroid Function Tests: No results for input(s): TSH, T4TOTAL, FREET4, T3FREE, THYROIDAB in the last 72 hours. Anemia Panel: No results for input(s): VITAMINB12, FOLATE, FERRITIN, TIBC, IRON, RETICCTPCT in the last 72 hours. Urine analysis:    Component Value Date/Time   COLORURINE AMBER (A) 10/26/2014 1727   APPEARANCEUR CLEAR 10/26/2014 1727   LABSPEC 1.020 10/26/2014 1727   PHURINE 5.5 10/26/2014 1727   GLUCOSEU NEGATIVE 10/26/2014 1727   HGBUR LARGE (A) 10/26/2014 1727   BILIRUBINUR NEGATIVE 10/26/2014 1727   KETONESUR NEGATIVE 10/26/2014 1727   PROTEINUR NEGATIVE  10/26/2014 1727   UROBILINOGEN 1.0 10/26/2014 1727   NITRITE NEGATIVE 10/26/2014 1727   LEUKOCYTESUR SMALL (A) 10/26/2014 1727   Sepsis Labs: @LABRCNTIP (procalcitonin:4,lacticidven:4)  ) Recent Results (from the past 240 hour(s))  Respiratory Panel by RT PCR (Flu A&B, Covid) - Nasopharyngeal Swab     Status: None   Collection Time: 09/02/20  9:47 PM   Specimen: Nasopharyngeal Swab  Result Value Ref Range Status   SARS Coronavirus 2 by RT PCR NEGATIVE NEGATIVE Final    Comment: (NOTE) SARS-CoV-2 target nucleic acids are NOT DETECTED.  The SARS-CoV-2 RNA is generally detectable in upper respiratoy specimens during the acute phase of infection. The lowest concentration of SARS-CoV-2 viral copies this assay can detect is 131 copies/mL. A negative result does not preclude SARS-Cov-2 infection and should not be used as the sole basis for treatment or other patient management decisions. A negative result may occur with  improper specimen collection/handling, submission of specimen other than nasopharyngeal swab, presence of viral mutation(s) within the areas targeted by this assay,  and inadequate number of viral copies (<131 copies/mL). A negative result must be combined with clinical observations, patient history, and epidemiological information. The expected result is Negative.  Fact Sheet for Patients:  PinkCheek.be  Fact Sheet for Healthcare Providers:  GravelBags.it  This test is no t yet approved or cleared by the [place] FDA and  has been authorized for detection and/or diagnosis of SARS-CoV-2 by FDA under an Emergency Use Authorization (EUA). This EUA will remain  in effect (meaning this test can be used) for the duration of the COVID-19 declaration under Section 564(b)(1) of the Act, 21 U.S.C. section 360bbb-3(b)(1), unless the authorization is terminated or revoked sooner.     Influenza A by PCR NEGATIVE  NEGATIVE Final   Influenza B by PCR NEGATIVE NEGATIVE Final    Comment: (NOTE) The Xpert Xpress SARS-CoV-2/FLU/RSV assay is intended as an aid in  the diagnosis of influenza from Nasopharyngeal swab specimens and  should not be used as a sole basis for treatment. Nasal washings and  aspirates are unacceptable for Xpert Xpress SARS-CoV-2/FLU/RSV  testing.  Fact Sheet for Patients: PinkCheek.be  Fact Sheet for Healthcare Providers: GravelBags.it  This test is not yet approved or cleared by the [place] FDA and  has been authorized for detection and/or diagnosis of SARS-CoV-2 by  FDA under an Emergency Use Authorization (EUA). This EUA will remain  in effect (meaning this test can be used) for the duration of the  Covid-19 declaration under Section 564(b)(1) of the Act, 21  U.S.C. section 360bbb-3(b)(1), unless the authorization is  terminated or revoked. Performed at [facility], [place] [address], [place], [place] [address]       Studies: DG Chest 1 View  Result Date: 09/02/2020 CLINICAL DATA:  Status post fall. EXAM: CHEST  1 VIEW COMPARISON:  June 08, 2015 FINDINGS: Cardiomediastinal silhouette is normal. Mediastinal contours appear intact. Mild calcific atherosclerotic disease of the aorta. There is no evidence of focal airspace consolidation, pleural effusion or pneumothorax. Osseous structures are without acute abnormality. Soft tissues are grossly normal. IMPRESSION: No active disease. Electronically Signed   By: Fidela Salisbury M.D.   On: 09/02/2020 19:53   DG Pelvis 1-2 Views  Result Date: 09/02/2020 CLINICAL DATA:  Fall EXAM: PELVIS - 1-2 VIEW COMPARISON:  None. FINDINGS: There is no evidence of pelvic fracture or diastasis. No pelvic bone lesions are seen. IMPRESSION: Negative. Electronically Signed   By: Rolm Baptise M.D.   On: 09/02/2020 19:55   DG Tibia/Fibula Left  Result Date:  09/02/2020 CLINICAL DATA:  Fall EXAM: LEFT TIBIA AND FIBULA - 2 VIEW COMPARISON:  None. FINDINGS: No acute bony abnormality. Specifically, no fracture, subluxation, or dislocation. No radiopaque foreign body. IMPRESSION: No acute bony abnormality. Electronically Signed   By: Rolm Baptise M.D.   On: 09/02/2020 19:57   CT Head Wo Contrast  Result Date: 09/02/2020 CLINICAL DATA:  Fall, hit head EXAM: CT HEAD WITHOUT CONTRAST TECHNIQUE: Contiguous axial images were obtained from the base of the skull through the vertex without intravenous contrast. COMPARISON:  None. FINDINGS: Brain: There is atrophy and chronic small vessel disease changes. No acute intracranial abnormality. Specifically, no hemorrhage, hydrocephalus, mass lesion, acute infarction, or significant intracranial injury. Vascular: No hyperdense vessel or unexpected calcification. Skull: No acute calvarial abnormality. Sinuses/Orbits: Visualized paranasal sinuses and mastoids clear. Orbital soft tissues unremarkable. Other: Soft tissue swelling posterior scalp. IMPRESSION: Atrophy, chronic microvascular disease. No acute intracranial abnormality. Electronically Signed   By: Lennette Bihari  [place] M.D.   On: 09/02/2020 20:28   CT Cervical Spine Wo Contrast  Result Date: 09/02/2020 CLINICAL DATA:  Fall, hit back of head. EXAM: CT CERVICAL SPINE WITHOUT CONTRAST TECHNIQUE: Multidetector CT imaging of the cervical spine was performed without intravenous contrast. Multiplanar CT image reconstructions were also generated. COMPARISON:  None. FINDINGS: Alignment: Slight degenerative anterolisthesis of C2 on C3 and C7 on T1. Skull base and vertebrae: No acute fracture. No primary bone lesion or focal pathologic process. Soft tissues and spinal canal: No prevertebral fluid or swelling. No visible canal hematoma. Disc levels: Diffuse advanced degenerative disc disease. Mild bilateral degenerative facet disease. Upper chest: No acute findings Other: Bilateral carotid  artery calcifications. IMPRESSION: Degenerative disc and facet disease.  No acute bony abnormality. Electronically Signed   By: Rolm Baptise M.D.   On: 09/02/2020 20:28    Scheduled Meds: . allopurinol  150 mg Oral Daily  . imatinib  200 mg Oral Q breakfast  . [START ON 09/04/2020] influenza vaccine adjuvanted  0.5 mL Intramuscular Tomorrow-1000  . metoprolol succinate  50 mg Oral Daily  . potassium chloride  40 mEq Oral Once  . tamsulosin  0.4 mg Oral QHS    Continuous Infusions:   LOS: 0 days     Kayleen Memos, MD [facility] Pager [phone]  If 7PM-7AM, please contact night-coverage [email or web address] Password [email or web address] 09/03/2020, 10:41 AM

## 2020-09-03 NOTE — Progress Notes (Signed)
Removed rest of dressing from head wound. No active bleeding noted. Placed gauze and abd pads and secured with kerlix.

## 2020-09-03 NOTE — Evaluation (Signed)
Physical Therapy Evaluation Patient Details Name: William Ibarra MRN: [id number] DOB: Apr 29, 1924 Today's Date: 09/03/2020   History of Present Illness  William Ibarra is a 86 y.o. male hx of afib not on blood thinners, diabetes, hypertension, CLL here presenting with fall. Patient states that he may have slipped and fell and hit his head. Patient was noted to have a hematoma in the back of his head. He also has some skin tears in his leg. He was noted to have profuse bleeding. Patient is not currently on blood thinners. Pt admitted with Head laceration and hematoma post mechanical fall at home  Clinical Impression  Pt admitted with above diagnosis.  Pt currently with functional limitations due to the deficits listed below (see PT Problem List). Pt will benefit from skilled PT to increase their independence and safety with mobility to allow discharge to the venue listed below.  Pt assisted to standing and requiring max +2 assist due to right knee pain and buckling.  RN notified of right knee pain (pt fell prior to admission).  Pt was receiving HHPT prior to admission.  Recommend SNF upon d/c due to decreased mobility and increased assist.     Follow Up Recommendations SNF    Equipment Recommendations  Wheelchair cushion (measurements PT);Wheelchair (measurements PT)    Recommendations for Other Services       Precautions / Restrictions Precautions Precautions: Fall      Mobility  Bed Mobility Overal bed mobility: Needs Assistance Bed Mobility: Supine to Sit;Sit to Supine     Supine to sit: Max assist;+2 for physical assistance Sit to supine: Mod assist;+2 for physical assistance        Transfers Overall transfer level: Needs assistance Equipment used: Rolling walker (2 wheeled) Transfers: Sit to/from Stand Sit to Stand: Max assist;+2 physical assistance;From elevated surface         General transfer comment: assist to rise and steady as well as control descent,  performed twice; pt with right knee buckling and reports right knee pain in standing; pt felt unable to take a step or pivot to recliner  Ambulation/Gait                Stairs            Wheelchair Mobility    Modified Rankin (Stroke Patients Only)       Balance Overall balance assessment: History of Falls;Needs assistance Sitting-balance support: Bilateral upper extremity supported Sitting balance-Leahy Scale: Fair     Standing balance support: Bilateral upper extremity supported Standing balance-Leahy Scale: Zero Standing balance comment: requiring increased assist for standing                             Pertinent Vitals/Pain Pain Assessment: Faces Pain Score: 4  Faces Pain Scale: Hurts whole lot Pain Location: R knee Pain Descriptors / Indicators: Grimacing;Guarding Pain Intervention(s): Repositioned;Monitored during session    Home Living Family/patient expects to be discharged to:: Private residence Living Arrangements: Alone Available Help at Discharge: [place] Type of Home: Independent living facility       Home Layout: One level Home Equipment: Environmental consultant - 2 wheels      Prior Function Level of Independence: Independent with assistive device(s)         Comments: typically uses RW, has been working with HHPT at facility ([facility]) on sit to stands     Hand Dominance  Extremity/Trunk Assessment   Upper Extremity Assessment Upper Extremity Assessment: Generalized weakness    Lower Extremity Assessment Lower Extremity Assessment: RLE deficits/detail;Generalized weakness RLE Deficits / Details: R AFO in shoe which was donned for standing, pt reports severe right knee with weight bearing       Communication   Communication: HOH  Cognition Arousal/Alertness: Awake/alert Behavior During Therapy: WFL for tasks assessed/performed Overall Cognitive Status: Within Functional Limits for tasks  assessed                                        General Comments      Exercises     Assessment/Plan    PT Assessment Patient needs continued PT services  PT Problem List Decreased mobility;Decreased strength;Decreased activity tolerance;Decreased balance;Decreased knowledge of use of DME;Pain       PT Treatment Interventions DME instruction;Therapeutic exercise;Gait training;Balance training;Functional mobility training;Therapeutic activities;Patient/family education;Wheelchair mobility training    PT Goals (Current goals can be found in the Care Plan section)  Acute Rehab PT Goals Patient Stated Goal: did not state. Pt flustered upon eval. PT Goal Formulation: With patient/family Time For Goal Achievement: 09/17/20 Potential to Achieve Goals: Good    Frequency Min 2X/week   Barriers to discharge        Co-evaluation               AM-PAC PT "6 Clicks" Mobility  Outcome Measure Help needed turning from your back to your side while in a flat bed without using bedrails?: A Lot Help needed moving from lying on your back to sitting on the side of a flat bed without using bedrails?: A Lot Help needed moving to and from a bed to a chair (including a wheelchair)?: A Lot Help needed standing up from a chair using your arms (e.g., wheelchair or bedside chair)?: A Lot Help needed to walk in hospital room?: Total Help needed climbing 3-5 steps with a railing? : Total 6 Click Score: 10    End of Session Equipment Utilized During Treatment: Gait belt Activity Tolerance: Patient limited by pain Patient left: in bed;with call bell/phone within reach;with bed alarm set;with family/visitor present Nurse Communication: Mobility status PT Visit Diagnosis: Other abnormalities of gait and mobility (R26.89);Pain Pain - Right/Left: Right Pain - part of body: Knee    Time: 1011-1026 PT Time Calculation (min) (ACUTE ONLY): 15 min   Charges:   PT Evaluation $PT  Eval Low Complexity: 1 Low          Kati PT, DPT Acute Rehabilitation Services Pager: [phone] Office: [phone]  York Ram E 09/03/2020, 12:18 PM

## 2020-09-03 NOTE — Evaluation (Signed)
Occupational Therapy Evaluation Patient Details Name: William Ibarra MRN: [id number] DOB: [birth year] Today's Date: 09/03/2020    History of Present Illness William Ibarra is a 86 y.o. male hx of afib not on blood thinners, diabetes, hypertension, CLL here presenting with fall. Patient states that he may have slipped and fell and hit his head. Patient was noted to have a hematoma in the back of his head. He also has some skin tears in his leg. He was noted to have profuse bleeding. Patient is not currently on blood thinners.   Clinical Impression   Pt admitted s/p fall in which he hit his head. Pt currently with functional limitations due to the deficits listed below (see OT Problem List).  Pt will benefit from skilled OT to increase their safety and independence with ADL and functional mobility for ADL to facilitate discharge to venue listed below.   MD came during OT eval to speak with pt and daugther    Follow Up Recommendations  SNF    Equipment Recommendations  None recommended by OT    Recommendations for Other Services       Precautions / Restrictions Precautions Precautions: Fall      Mobility Bed Mobility Overal bed mobility: Needs Assistance Bed Mobility: Supine to Sit;Sit to Supine     Supine to sit: Mod assist Sit to supine: Mod assist        Transfers                 General transfer comment: NT    Balance Overall balance assessment: Needs assistance Sitting-balance support: Bilateral upper extremity supported Sitting balance-Leahy Scale: Fair                                     ADL either performed or assessed with clinical judgement   ADL Overall ADL's : Needs assistance/impaired Eating/Feeding: Minimal assistance   Grooming: Wash/dry hands;Sitting;Minimal assistance   Upper Body Bathing: Minimal assistance;Sitting   Lower Body Bathing: Maximal assistance;Cueing for sequencing;Cueing for safety;Sitting/lateral  leans Lower Body Bathing Details (indicate cue type and reason): did not stand Upper Body Dressing : Minimal assistance   Lower Body Dressing: Maximal assistance;Sitting/lateral leans                 General ADL Comments: pt sat EOB with OT and then Dr Letta Pate came in.  Pt opted to return to supine for MD visit.     Vision Baseline Vision/History: Wears glasses Additional Comments: glasses but unable to don them due to head wrap            Pertinent Vitals/Pain Pain Assessment: Faces Pain Score: 4  Pain Location: general Pain Descriptors / Indicators: Sore Pain Intervention(s): Limited activity within patient's tolerance     Hand Dominance     Extremity/Trunk Assessment Upper Extremity Assessment Upper Extremity Assessment: Generalized weakness           Communication Communication Communication: No difficulties   Cognition Arousal/Alertness: Awake/alert Behavior During Therapy: Restless Overall Cognitive Status: Within Functional Limits for tasks assessed                                                Home Living Family/patient expects to be discharged to:: Private residence Living Arrangements: Children Available  Help at Discharge: [place] Type of Home: Apartment       Home Layout: One level     Bathroom Shower/Tub: Occupational psychologist: Standard     Home Equipment: Environmental consultant - 2 wheels          Prior Functioning/Environment Level of Independence: Independent with assistive device(s)                 OT Problem List: Decreased strength;Impaired balance (sitting and/or standing);Decreased activity tolerance;Decreased knowledge of use of DME or AE;Decreased safety awareness      OT Treatment/Interventions: Self-care/ADL training;Patient/family education;Therapeutic exercise;Therapeutic activities;DME and/or AE instruction    OT Goals(Current goals can be found in the care plan section)  Acute Rehab OT Goals Patient Stated Goal: did not state. Pt flustered upon eval. OT Goal Formulation: With patient Time For Goal Achievement: 09/17/20 Potential to Achieve Goals: Good  OT Frequency: Min 2X/week   Barriers to D/C:    pt lives at ALF but [facility]          AM-PAC OT "6 Clicks" Daily Activity     Outcome Measure Help from another person eating meals?: A Little Help from another person taking care of personal grooming?: A Little Help from another person toileting, which includes using toliet, bedpan, or urinal?: A Lot Help from another person bathing (including washing, rinsing, drying)?: A Lot Help from another person to put on and taking off regular upper body clothing?: A Little Help from another person to put on and taking off regular lower body clothing?: A Lot 6 Click Score: 15   End of Session Nurse Communication: Mobility status  Activity Tolerance: Patient limited by fatigue;Other (comment) (MD came in to talk to pt and daughter) Patient left: in bed;with call bell/phone within reach;with family/visitor present  OT Visit Diagnosis: Unsteadiness on feet (R26.81);Other abnormalities of gait and mobility (R26.89);History of falling (Z91.81)                Time: 3557-3220 OT Time Calculation (min): 17 min Charges:  OT General Charges $OT Visit: 1 Visit OT Evaluation $OT Eval Moderate Complexity: 1 Mod  Kari Baars, OT Acute Rehabilitation Services [phone] Office- [phone]     Azure, Edwena Felty D 09/03/2020, 10:01 AM

## 2020-09-03 NOTE — Progress Notes (Signed)
Reports pain in his R knee and R hip when putting weight on his right side while working with PT.  Port R knee/tibia/fibula/hip ordered.  Will follow results.

## 2020-09-03 NOTE — TOC Initial Note (Signed)
Transition of Care [facility]) - Initial/Assessment Note    Patient Details  Name: William Ibarra MRN: [id number] Date of Birth: Nov 29, 1923  [facility]) CM/SW Contact:    Ova Freshwater Phone Number: [phone] 09/03/2020, 3:50 PM  Clinical Narrative:                  CSW spoke with patient's Corin, Tilly (Daughter) [phone] about TOC role in patient care. CSW stated PT and OT have recommended SNF placement for the patient after d/c.  Ms. Josiah Lobo stated the patient would like to go to [facility], because he lives there in independent living.  The patient's goal is to return to his apartment.  Ms. Josiah Lobo stated their other preferred is the [facility].    Ms. Josiah Lobo has some concerns about the 3 night waiver, and stated she had a letter from [facility] which confirm the waiving of the three night stay.  CSW stated I would verify this with the SNF facility.  CSW let Ms. Josiah Lobo know there will be another CSW here tomorrow and they would contact her with any updates.  Ms. Josiah Lobo verbalized understanding  CSW sent [place] via [place], [place] A  Expected Discharge Plan: Skilled Nursing Facility Barriers to Discharge: Continued Medical Work up, SNF Pending bed offer, SNF Pending discharge summary   Patient Goals and CMS Choice Patient states their goals for this hospitalization and ongoing recovery are:: To return to apartment CMS Medicare.gov Compare Post Acute Care list provided to:: Other (Comment Required) Naveed, Humphres (Daughter) [phone]) Choice offered to / list presented to : Adult Children Zayvier, Caravello (Daughter) [phone])  Expected Discharge Plan and Services Expected Discharge Plan: [place] In-house Referral: Clinical Social Work   Post Acute Care Choice: 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[place], Materials engineer Lives with:: Self Patient language and need for interpreter reviewed:: Yes Do you feel safe going back to the place where you live?: Yes      Need for Family Participation in Patient Care: Yes (Comment) Care giver support system in place?: Yes (comment)   Criminal Activity/Legal Involvement Pertinent to Current Situation/Hospitalization: No - Comment as needed  Activities of Daily Living Home Assistive Devices/Equipment: Bedside commode/3-in-1, Cane (specify quad or straight) ADL Screening (condition at time of admission) Patient's cognitive ability adequate to safely complete daily activities?: No Is the patient deaf or have difficulty hearing?: Yes Does the patient have difficulty seeing, even when wearing glasses/contacts?: Yes Does the patient have difficulty concentrating, remembering, or making decisions?: No Patient able to express need for assistance with ADLs?: Yes Does the patient have difficulty dressing or bathing?: Yes Independently performs ADLs?: Yes (appropriate for developmental age) Does the patient have difficulty walking or climbing stairs?: Yes Weakness of Legs: Both Weakness of Arms/Hands: Both  Permission Sought/Granted   Permission granted to share information with : Yes, Verbal Permission Granted  Share Information with NAME: Azekiel, Cre[place] (Daughter) [phone]  Permission granted to share info w AGENCY: [facility]        Emotional Assessment Appearance:: Appears stated age Attitude/Demeanor/Rapport: Self-Confident Affect (typically observed): Stable Orientation: : Oriented to Self, Oriented  to Place, Oriented to Situation Alcohol / Substance Use: Illicit Drugs Psych Involvement: No (comment)  Admission diagnosis:  CLL (chronic lymphocytic leukemia) (HCC) [C91.10] Laceration of head  [S01.91XA] Scalp hematoma, initial encounter [S00.03XA] Anemia [D64.9] Patient Active Problem List   Diagnosis Date Noted  . Chronic myeloid leukemia ([place]) 09/03/2020  . HTN (hypertension) 09/02/2020  . Laceration of head 09/02/2020  . Hematoma 10/24/2014  . Hypotension 10/24/2014  . Anemia 02/06/2012    Class: Acute  . Hypotension 02/05/2012    Class: Acute  . Anticoagulation excessive 02/05/2012    Class: Acute  . Diabetes mellitus type 2, controlled ([place]) 02/05/2012    Class: Acute  . Atrial fibrillation ([place]) 02/05/2012    Class: Chronic  . Psoriasis 02/05/2012    Class: Chronic   PCP:  Shon Baton, MD Pharmacy:   [facility], [place] [address] Phone: [phone] Fax: [phone]     Social Determinants of Health (SDOH) Interventions    Readmission Risk Interventions No flowsheet data found.

## 2020-09-03 NOTE — Progress Notes (Signed)
[place]  [phone] Fax:([phone]) [phone]     ID: KONG PACKETT DOB: [birth year]  MR#: [id number]  XKG#:[id number]  Patient Care Team: Shon Baton, MD as PCP - General (Internal Medicine) Chauncey Cruel, MD OTHER MD:  CHIEF COMPLAINT: Leukocytosis, thrombocytosis, anemia  CURRENT TREATMENT: To start [place]   HISTORY OF CURRENT ILLNESS: Mr. Gasparini fell at home and hit his head.  He is not aware of how he fell.  He tells me he has been falling despite the use of a walker.  He came to the emergency room and he was found to have in addition to a laceration to his head very abnormal labs, with a white cell count of 51.7, hemoglobin 9.0, and platelets 591,000.  Review of the peripheral blood film shows many immature white cells to the level of the myelocytes, although I did not see any blasts.  This picture is most consistent with a chronic myeloid leukemia.  We were consulted for further evaluation and treatment.  The patient's subsequent history is as detailed below.  INTERVAL HISTORY: I met with the patient in his hospital room on 09/03/2020.  His daughter Barnie Del was also present   REVIEW OF SYSTEMS: What is bothering Lelend the most right now is the wrapping around his head.  He wishes it could be removed.  He is not aware of unusual headaches visual changes nausea or vomiting.  He does have a balance problem, has felt weak, and as noted above has had multiple falls despite the use of the walker.  He used to exercise 5 times a week but since the pandemic many of those programs have been closed at friends homes and he has declined.  He tends to be on the constipated side.  Currently denies pain, cough phlegm production pleurisy or shortness of breath.  A detailed review of systems is otherwise stable  PAST MEDICAL HISTORY: Past Medical History:  Diagnosis Date   Atrial fibrillation (HCC)    BPH (benign prostatic hypertrophy)    Diabetes  mellitus    Diverticulosis of colon    from a prior colonoscopy   History of blood transfusion ~ 10/2013; 10/24/2014   "related to blood thinners"   Hydrocele    Hydrocele    Hypertension    Hypotension 02/05/2012   Impaired glucose tolerance    Overactive bladder    Psoriasis    Rosacea    Spermatocele     PAST SURGICAL HISTORY: Past Surgical History:  Procedure Laterality Date   CATARACT EXTRACTION W/ INTRAOCULAR LENS  IMPLANT, BILATERAL Bilateral    "successful in left eye; redid right"   HYDROCELE EXCISION / REPAIR     MELANOMA EXCISION Right    eye   TONSILLECTOMY      FAMILY HISTORY Family History  Problem Relation Age of Onset   Bleeding Disorder Neg Hx   The patient's father died from a stroke.  The patient's mother died at age 50 from a blood disorder not otherwise specified.  The patient is an only child.  He is not aware of any cancer in the family other than as just noted  SOCIAL HISTORY:  Hinton used to sell hardware.  He is widowed and lives by himself (son from [place].  His oldest daughter Venetia Night lives at [place] where she is a Scientist, research (life sciences).  Son Rolena Infante lives in [place] and is retired.  Daughter Barnie Del works part-time jobs in [place].  The patient has 2 grandchildren  and one great grandchild.  He is a quicker.    ADVANCED DIRECTIVES: The patient's daughter Barnie Del is his healthcare power of attorney.  She can be reached at [phone].   HEALTH MAINTENANCE: Social History   Tobacco Use   Smoking status: Former Smoker    Packs/day: 0.50    Years: 30.00    Pack years: 15.00    Types: Cigarettes    Quit date: 02/05/1992    Years since quitting: 28.5   Smokeless tobacco: Never Used  Substance Use Topics   Alcohol use: Yes    Alcohol/week: 7.0 standard drinks    Types: 7 Glasses of wine per week   Drug use: No     Colonoscopy:  PAP:  Bone density:   No Known Allergies  Current Facility-Administered  Medications  Medication Dose Route Frequency Provider Last Rate Last Admin   acetaminophen (TYLENOL) tablet 650 mg  650 mg Oral Q6H PRN Etta Quill, DO       Or   acetaminophen (TYLENOL) suppository 650 mg  650 mg Rectal Q6H PRN Etta Quill, DO       [START ON 09/04/2020] influenza vaccine adjuvanted (FLUAD) injection 0.5 mL  0.5 mL Intramuscular Tomorrow-1000 Alcario Drought, Jared M, DO       metoprolol succinate (TOPROL-XL) 24 hr tablet 50 mg  50 mg Oral Daily Alcario Drought, Jared M, DO       ondansetron [facility]) tablet 4 mg  4 mg Oral Q6H PRN Etta Quill, DO       Or   ondansetron [facility]) injection 4 mg  4 mg Intravenous Q6H PRN Etta Quill, DO       tamsulosin [facility]) capsule 0.4 mg  0.4 mg Oral QHS Jennette Kettle M, DO   0.4 mg at 09/03/20 0247    OBJECTIVE: Elderly white man examined in bed  Vitals:   09/03/20 0100 09/03/20 0257  BP: 118/89 (!) 125/50  Pulse:  75  Resp: (!) 23 20  Temp:  98.1 F (36.7 C)  SpO2:       Body mass index is 25.73 kg/m.   Wt Readings from Last 3 Encounters:  09/02/20 195 lb (88.5 kg)  07/24/20 200 lb (90.7 kg)  11/10/19 200 lb (90.7 kg)      ECOG FS:2 - Symptomatic, <50% confined to bed  Ocular: Sclerae unicteric Lymphatic: No cervical or supraclavicular adenopathy Lungs no rales or rhonchi, auscultated anterolaterally Heart regular rate and rhythm Abd soft, nontender, positive bowel sounds, no palpable splenomegaly Neuro: non-focal, well-oriented, appropriate affect   LAB RESULTS:  CMP     Component Value Date/Time   NA 142 09/03/2020 0519   K 3.5 09/03/2020 0519   CL 104 09/03/2020 0519   CO2 26 09/03/2020 0519   GLUCOSE 109 (H) 09/03/2020 0519   BUN 21 09/03/2020 0519   CREATININE 0.89 09/03/2020 0519   CALCIUM 8.5 (L) 09/03/2020 0519   PROT 6.1 10/24/2014 1535   ALBUMIN 3.2 (L) 10/24/2014 1535   AST 19 10/24/2014 1535   ALT 12 10/24/2014 1535   ALKPHOS 46 10/24/2014 1535   BILITOT 1.6 (H) 10/24/2014 1535     GFRNONAA >60 09/03/2020 0519   GFRAA 74 (L) 10/27/2014 0639    No results found for: TOTALPROTELP, ALBUMINELP, A1GS, A2GS, BETS, BETA2SER, GAMS, MSPIKE, SPEI  No results found for: KPAFRELGTCHN, LAMBDASER, KAPLAMBRATIO  Lab Results  Component Value Date   WBC 41.9 (H) 09/03/2020   NEUTROABS 38.2 (H) 09/02/2020   HGB  7.5 (L) 09/03/2020   HCT 26.5 (L) 09/03/2020   MCV 88.6 09/03/2020   PLT 525 (H) 09/03/2020    _0 @  No results found for: LABCA2  No components found for: [id number]  Recent Labs  Lab 09/02/20 1925  INR 1.1    No results found for: LABCA2  No results found for: ITG549  No results found for: IYM415  No results found for: AXE940  No results found for: CA2729  No components found for: HGQUANT  No results found for: CEA1 / No results found for: CEA1   No results found for: AFPTUMOR  No results found for: CHROMOGRNA  No results found for: PSA1  Admission on 09/02/2020  Component Date Value Ref Range Status   WBC 09/02/2020 51.7* 4.0 - 10.5 K/uL Final   This critical result has verified and been called to [facility] by Atha Starks on 10 30 2021 at 2053, and has been read back.    RBC 09/02/2020 3.60* 4.22 - 5.81 MIL/uL Final   Hemoglobin 09/02/2020 9.0* 13.0 - 17.0 g/dL Final   HCT 09/02/2020 31.7* 39 - 52 % Final   MCV 09/02/2020 88.1  80.0 - 100.0 fL Final   MCH 09/02/2020 25.0* 26.0 - 34.0 pg Final   MCHC 09/02/2020 28.4* 30.0 - 36.0 g/dL Final   RDW 09/02/2020 25.6* 11.5 - 15.5 % Final   Platelets 09/02/2020 591* 150 - 400 K/uL Final   nRBC 09/02/2020 0.3* 0.0 - 0.2 % Final   Neutrophils Relative % 09/02/2020 73  % Final   Neutro Abs 09/02/2020 38.2* 1.7 - 7.7 K/uL Final   Lymphocytes Relative 09/02/2020 4  % Final   Lymphs Abs 09/02/2020 1.9  0.7 - 4.0 K/uL Final   Monocytes Relative 09/02/2020 3  % Final   Monocytes Absolute 09/02/2020 1.6* 0.1 - 1.0 K/uL Final   Eosinophils Relative 09/02/2020  2  % Final   Eosinophils Absolute 09/02/2020 1.2* 0.0 - 0.5 K/uL Final   Basophils Relative 09/02/2020 2  % Final   Basophils Absolute 09/02/2020 0.8* 0.0 - 0.1 K/uL Final   WBC Morphology 09/02/2020 MODERATE LEFT SHIFT (>5% METAS AND MYELOS,OCC PRO NOTED)   Final   Immature Granulocytes 09/02/2020 16  % Final   Abs Immature Granulocytes 09/02/2020 8.05* 0.00 - 0.07 K/uL Final   Polychromasia 09/02/2020 PRESENT   Final   Ovalocytes 09/02/2020 PRESENT   Final   Giant PLTs 09/02/2020 PRESENT   Final   Performed at [facility], [place] [address], [place], [place] [address]   Sodium 09/02/2020 140  135 - 145 mmol/L Final   Potassium 09/02/2020 4.8  3.5 - 5.1 mmol/L Final   Chloride 09/02/2020 101  98 - 111 mmol/L Final   CO2 09/02/2020 28  22 - 32 mmol/L Final   Glucose, Bld 09/02/2020 122* 70 - 99 mg/dL Final   Glucose reference range applies only to samples taken after fasting for at least 8 hours.   BUN 09/02/2020 26* 8 - 23 mg/dL Final   Creatinine, Ser 09/02/2020 0.89  0.61 - 1.24 mg/dL Final   Calcium 09/02/2020 8.7* 8.9 - 10.3 mg/dL Final   GFR, Estimated 09/02/2020 >60  >60 mL/min Final   Comment: (NOTE) Calculated using the CKD-EPI Creatinine Equation (2021)    Anion gap 09/02/2020 11  5 - 15 Final   Performed at [facility], [place] [address] [place], [place] [address]   Prothrombin Time 09/02/2020 13.7  11.4 - 15.2 seconds Final  INR 09/02/2020 1.1  0.8 - 1.2 Final   Comment: (NOTE) INR goal varies based on device and disease states. Performed at [facility], [place] [address] [place], [place] [address]    Smear Review 09/03/2020 SMEAR STAINED AND AVAILABLE FOR REVIEW   Final   Performed at [facility], [place] [address] [place], [place] [address]   SARS Coronavirus 2 by RT PCR 09/02/2020 NEGATIVE  NEGATIVE Final   Comment: (NOTE) SARS-CoV-2 target nucleic acids are NOT  DETECTED.  The SARS-CoV-2 RNA is generally detectable in upper respiratoy specimens during the acute phase of infection. The lowest concentration of SARS-CoV-2 viral copies this assay can detect is 131 copies/mL. A negative result does not preclude SARS-Cov-2 infection and should not be used as the sole basis for treatment or other patient management decisions. A negative result may occur with  improper specimen collection/handling, submission of specimen other than nasopharyngeal swab, presence of viral mutation(s) within the areas targeted by this assay, and inadequate number of viral copies (<131 copies/mL). A negative result must be combined with clinical observations, patient history, and epidemiological information. The expected result is Negative.  Fact Sheet for Patients:  PinkCheek.be  Fact Sheet for Healthcare Providers:  GravelBags.it  This test is no                          t yet approved or cleared by the [place] FDA and  has been authorized for detection and/or diagnosis of SARS-CoV-2 by FDA under an Emergency Use Authorization (EUA). This EUA will remain  in effect (meaning this test can be used) for the duration of the COVID-19 declaration under Section 564(b)(1) of the Act, 21 U.S.C. section 360bbb-3(b)(1), unless the authorization is terminated or revoked sooner.     Influenza A by PCR 09/02/2020 NEGATIVE  NEGATIVE Final   Influenza B by PCR 09/02/2020 NEGATIVE  NEGATIVE Final   Comment: (NOTE) The Xpert Xpress SARS-CoV-2/FLU/RSV assay is intended as an aid in  the diagnosis of influenza from Nasopharyngeal swab specimens and  should not be used as a sole basis for treatment. Nasal washings and  aspirates are unacceptable for Xpert Xpress SARS-CoV-2/FLU/RSV  testing.  Fact Sheet for Patients: PinkCheek.be  Fact Sheet for Healthcare  Providers: GravelBags.it  This test is not yet approved or cleared by the [place] FDA and  has been authorized for detection and/or diagnosis of SARS-CoV-2 by  FDA under an Emergency Use Authorization (EUA). This EUA will remain  in effect (meaning this test can be used) for the duration of the  Covid-19 declaration under Section 564(b)(1) of the Act, 21  U.S.C. section 360bbb-3(b)(1), unless the authorization is  terminated or revoked. Performed at [facility], [place] [address] [place], [place] [address]    WBC 09/03/2020 41.9* 4.0 - 10.5 K/uL Final   RBC 09/03/2020 2.99* 4.22 - 5.81 MIL/uL Final   Hemoglobin 09/03/2020 7.5* 13.0 - 17.0 g/dL Final   HCT 09/03/2020 26.5* 39 - 52 % Final   MCV 09/03/2020 88.6  80.0 - 100.0 fL Final   MCH 09/03/2020 25.1* 26.0 - 34.0 pg Final   MCHC 09/03/2020 28.3* 30.0 - 36.0 g/dL Final   RDW 09/03/2020 25.6* 11.5 - 15.5 % Final   Platelets 09/03/2020 525* 150 - 400 K/uL Final   nRBC 09/03/2020 0.3* 0.0 - 0.2 % Final   Performed at [facility] [place] [address] [place],  [place] [address]   Sodium 09/03/2020 142  135 - 145 mmol/L Final   Potassium 09/03/2020 3.5  3.5 - 5.1 mmol/L Final   DELTA CHECK NOTED   Chloride 09/03/2020 104  98 - 111 mmol/L Final   CO2 09/03/2020 26  22 - 32 mmol/L Final   Glucose, Bld 09/03/2020 109* 70 - 99 mg/dL Final   Glucose reference range applies only to samples taken after fasting for at least 8 hours.   BUN 09/03/2020 21  8 - 23 mg/dL Final   Creatinine, Ser 09/03/2020 0.89  0.61 - 1.24 mg/dL Final   Calcium 09/03/2020 8.5* 8.9 - 10.3 mg/dL Final   GFR, Estimated 09/03/2020 >60  >60 mL/min Final   Comment: (NOTE) Calculated using the CKD-EPI Creatinine Equation (2021)    Anion gap 09/03/2020 12  5 - 15 Final   Performed at [facility], [place] [address] [place], [place] [address]    (this displays the  last labs from the last 3 days)  No results found for: TOTALPROTELP, ALBUMINELP, A1GS, A2GS, BETS, BETA2SER, GAMS, MSPIKE, SPEI (this displays SPEP labs)  No results found for: KPAFRELGTCHN, LAMBDASER, KAPLAMBRATIO (kappa/lambda light chains)  No results found for: HGBA, HGBA2QUANT, HGBFQUANT, HGBSQUAN (Hemoglobinopathy evaluation)   No results found for: LDH  No results found for: IRON, TIBC, IRONPCTSAT (Iron and TIBC)  No results found for: FERRITIN  Urinalysis    Component Value Date/Time   COLORURINE AMBER (A) 10/26/2014 [place] 10/26/2014 1727   LABSPEC 1.020 10/26/2014 1727   PHURINE 5.5 10/26/2014 1727   GLUCOSEU NEGATIVE 10/26/2014 1727   HGBUR LARGE (A) 10/26/2014 1727   BILIRUBINUR NEGATIVE 10/26/2014 1727   KETONESUR NEGATIVE 10/26/2014 1727   PROTEINUR NEGATIVE 10/26/2014 1727   UROBILINOGEN 1.0 10/26/2014 1727   NITRITE NEGATIVE 10/26/2014 1727   LEUKOCYTESUR SMALL (A) 10/26/2014 1727     STUDIES: DG Chest 1 View  Result Date: 09/02/2020 CLINICAL DATA:  Status post fall. EXAM: CHEST  1 VIEW COMPARISON:  June 08, 2015 FINDINGS: Cardiomediastinal silhouette is normal. Mediastinal contours appear intact. Mild calcific atherosclerotic disease of the aorta. There is no evidence of focal airspace consolidation, pleural effusion or pneumothorax. Osseous structures are without acute abnormality. Soft tissues are grossly normal. IMPRESSION: No active disease. Electronically Signed   By: Fidela Salisbury M.D.   On: 09/02/2020 19:53   DG Pelvis 1-2 Views  Result Date: 09/02/2020 CLINICAL DATA:  Fall EXAM: PELVIS - 1-2 VIEW COMPARISON:  None. FINDINGS: There is no evidence of pelvic fracture or diastasis. No pelvic bone lesions are seen. IMPRESSION: Negative. Electronically Signed   By: Rolm Baptise M.D.   On: 09/02/2020 19:55   DG Tibia/Fibula Left  Result Date: 09/02/2020 CLINICAL DATA:  Fall EXAM: LEFT TIBIA AND FIBULA - 2 VIEW COMPARISON:   None. FINDINGS: No acute bony abnormality. Specifically, no fracture, subluxation, or dislocation. No radiopaque foreign body. IMPRESSION: No acute bony abnormality. Electronically Signed   By: Rolm Baptise M.D.   On: 09/02/2020 19:57   CT Head Wo Contrast  Result Date: 09/02/2020 CLINICAL DATA:  Fall, hit head EXAM: CT HEAD WITHOUT CONTRAST TECHNIQUE: Contiguous axial images were obtained from the base of the skull through the vertex without intravenous contrast. COMPARISON:  None. FINDINGS: Brain: There is atrophy and chronic small vessel disease changes. No acute intracranial abnormality. Specifically, no hemorrhage, hydrocephalus, mass lesion, acute infarction, or significant intracranial injury. Vascular: No hyperdense vessel or unexpected calcification. Skull: No acute calvarial  abnormality. Sinuses/Orbits: Visualized paranasal sinuses and mastoids clear. Orbital soft tissues unremarkable. Other: Soft tissue swelling posterior scalp. IMPRESSION: Atrophy, chronic microvascular disease. No acute intracranial abnormality. Electronically Signed   By: Rolm Baptise M.D.   On: 09/02/2020 20:28   CT Cervical Spine Wo Contrast  Result Date: 09/02/2020 CLINICAL DATA:  Fall, hit back of head. EXAM: CT CERVICAL SPINE WITHOUT CONTRAST TECHNIQUE: Multidetector CT imaging of the cervical spine was performed without intravenous contrast. Multiplanar CT image reconstructions were also generated. COMPARISON:  None. FINDINGS: Alignment: Slight degenerative anterolisthesis of C2 on C3 and C7 on T1. Skull base and vertebrae: No acute fracture. No primary bone lesion or focal pathologic process. Soft tissues and spinal canal: No prevertebral fluid or swelling. No visible canal hematoma. Disc levels: Diffuse advanced degenerative disc disease. Mild bilateral degenerative facet disease. Upper chest: No acute findings Other: Bilateral carotid artery calcifications. IMPRESSION: Degenerative disc and facet disease.  No acute  bony abnormality. Electronically Signed   By: Rolm Baptise M.D.   On: 09/02/2020 20:28    ELIGIBLE FOR AVAILABLE RESEARCH PROTOCOL: no  ASSESSMENT: 86 y.o. [place] resident admitted 09/02/2020 following a fall, found to have an elevated white cell count and platelet count and significant anemia, review of blood film most consistent with chronic myeloid leukemia  (1) BCR.ABL pending  (2) patient opted against bone marrow biopsy  (3) Gleevec started 09/03/2020  PLAN: I spent approximately 60 minutes face to face with Eulises and his daughter Eustaquio Maize with more than 50% of that time spent in counseling and coordination of care. Specifically we reviewed the biology of the patient's diagnosis and the specifics of his situation.    They understand there are 3 types of blood cells, red cells that carry oxygen, platelets help with clotting, and white cells that are the immune system.  All 3 cell lines grow in the marrow bones.  He has abnormalities in all 3 lines: He is anemic meaning he does not have enough red cells, he has too many platelets and he has too many white cells. . The fact that all 3 lines are involved suggest a primary bone marrow problem  Review of the peripheral blood film today shows many immature myeloid cells, although I did not see any blasts.  This is most consistent with a chronic myeloid leukemia.  We discussed the fact that chronic myeloid leukemia is due to a BCR-ABL translocation.  This is not something the patient was born with, but developed later.  It affects the bone marrow and one of the results likely is the patient's anemia.  This is one contributing factor to his repeated falls.  Diagnosis of CML generally requires demonstration of the BCR-ABL translocation and also a bone marrow biopsy.  The patient and daughter would prefer not to proceed to bone marrow biopsy at this point however.  We discussed that the treatment of this chronic leukemia, which generally  involves Gleevec.  We discussed the possible toxicities side effects and complications of this agent which however is generally well-tolerated  Accordingly we are sending blood for BCR-ABL today.  We will start [place] today.  The patient should be discharged on that medication.  I will arrange for outpatient follow-up, virtually if possible, so that the patient and daughter can participate in monthly virtual visits while we check counts and make sure he is tolerating treatment well and is responding.  I anticipate that within 6 months or so the patient's CBC will be considerably  improved and basically normalized and so far as it can be normal given his age and comorbidities.  Patient and daughter have a good understanding of this plan.  They are eager to proceed.  I will follow with you while in the hospital.  If he is discharged today or tomorrow I will make sure that he has appropriate follow-up.  Chauncey Cruel, MD   09/03/2020 9:15 AM Medical Oncology and Hematology [facility] [address] [place], [place] [address] Tel. [phone]    Fax. [phone]

## 2020-09-03 NOTE — Plan of Care (Signed)
  Problem: Clinical Measurements: Goal: Ability to maintain clinical measurements within normal limits will improve Outcome: Progressing   Problem: Clinical Measurements: Goal: Diagnostic test results will improve Outcome: Progressing   Problem: Clinical Measurements: Goal: Respiratory complications will improve Outcome: Progressing   Problem: Clinical Measurements: Goal: Cardiovascular complication will be avoided Outcome: Progressing   Problem: Activity: Goal: Risk for activity intolerance will decrease Outcome: Progressing

## 2020-09-03 NOTE — Progress Notes (Signed)
Dr Nevada Crane aware via phone earlier of low BP, rt knee pain and inability to stand w/PT; as well as what to about dressing on head- pt c/o to tight. See new orders.

## 2020-09-03 NOTE — Progress Notes (Deleted)
Pt verbalized understanding of dc instructions through teach back. Assessment unchanged. Discharged via foot per request accompanied by NT to go home via private vehicle.

## 2020-09-03 NOTE — NC FL2 (Addendum)
[place] MEDICAID FL2 LEVEL OF CARE SCREENING TOOL     IDENTIFICATION  Patient Name: William Ibarra Birthdate: 1924/05/18 Sex: male Admission Date (Current Location): 09/02/2020  [facility] and [place] Number:  Herbalist and Address:  [facility],  [place], [place]      Provider Number: [id number]  Attending Physician Name and Address:  Kayleen Memos, DO  Relative Name and Phone Number:  [place], [place] (Daughter) [phone]    Current Level of Care: Hospital Recommended Level of Care: [place] Prior Approval Number:    Date Approved/Denied:   PASRR Number: [id number]  Discharge Plan: SNF    Current Diagnoses: Patient Active Problem List   Diagnosis Date Noted  . Chronic myeloid leukemia ([place]) 09/03/2020  . HTN (hypertension) 09/02/2020  . Laceration of head 09/02/2020  . Hematoma 10/24/2014  . Hypotension 10/24/2014  . Anemia 02/06/2012  . Hypotension 02/05/2012  . Anticoagulation excessive 02/05/2012  . Diabetes mellitus type 2, controlled ([place]) 02/05/2012  . Atrial fibrillation ([place]) 02/05/2012  . Psoriasis 02/05/2012    Orientation RESPIRATION BLADDER Height & Weight     Self, Place, Situation  Normal Continent Weight: 195 lb (88.5 kg) Height:  6\' 1"  (185.4 cm)  BEHAVIORAL SYMPTOMS/MOOD NEUROLOGICAL BOWEL NUTRITION STATUS      Continent Diet  AMBULATORY STATUS COMMUNICATION OF NEEDS Skin   Extensive Assist Verbally Normal                       Personal Care Assistance Level of Assistance  Bathing, Feeding, Dressing Bathing Assistance: Limited assistance Feeding assistance: Limited assistance Dressing Assistance: Limited assistance     Functional Limitations Info  Sight, Hearing, Speech Sight Info: Adequate Hearing Info: Adequate Speech Info: Adequate    SPECIAL CARE FACTORS FREQUENCY           PT X5 per week OT 5X per week            Contractures       Additional Factors Info                  Current Medications (09/03/2020):  This is the current hospital active medication list Current Facility-Administered Medications  Medication Dose Route Frequency Provider Last Rate Last Admin  . acetaminophen (TYLENOL) tablet 650 mg  650 mg Oral Q6H PRN Etta Quill, DO       Or  . acetaminophen (TYLENOL) suppository 650 mg  650 mg Rectal Q6H PRN Etta Quill, DO      . allopurinol (ZYLOPRIM) tablet 150 mg  150 mg Oral Daily Magrinat, Virgie Dad, MD   150 mg at 09/03/20 1205  . imatinib (GLEEVEC) tablet 200 mg  200 mg Oral Q breakfast Magrinat, Virgie Dad, MD   200 mg at 09/03/20 1112  . [START ON 09/04/2020] influenza vaccine adjuvanted (FLUAD) injection 0.5 mL  0.5 mL Intramuscular Tomorrow-1000 Jennette Kettle M, DO      . ondansetron [facility]) tablet 4 mg  4 mg Oral Q6H PRN Etta Quill, DO       Or  . ondansetron [facility]) injection 4 mg  4 mg Intravenous Q6H PRN Etta Quill, DO      . tamsulosin [facility]) capsule 0.4 mg  0.4 mg Oral QHS Jennette Kettle M, DO   0.4 mg at 09/03/20 5400     Discharge Medications: Please see discharge summary for a list of discharge medications.  Relevant Imaging Results:  Relevant Lab Results:   Additional Information SS# [id number]  Adelene Amas, LCSWA

## 2020-09-04 ENCOUNTER — Telehealth: Payer: Self-pay | Admitting: Oncology

## 2020-09-04 LAB — CBC WITH DIFFERENTIAL/PLATELET
Abs Immature Granulocytes: 3.7 10*3/uL — ABNORMAL HIGH (ref 0.00–0.07)
Band Neutrophils: 5 %
Basophils Absolute: 0.4 10*3/uL — ABNORMAL HIGH (ref 0.0–0.1)
Basophils Relative: 1 %
Eosinophils Absolute: 0.7 10*3/uL — ABNORMAL HIGH (ref 0.0–0.5)
Eosinophils Relative: 2 %
HCT: 25.1 % — ABNORMAL LOW (ref 39.0–52.0)
Hemoglobin: 7.1 g/dL — ABNORMAL LOW (ref 13.0–17.0)
Lymphocytes Relative: 7 %
Lymphs Abs: 2.6 10*3/uL (ref 0.7–4.0)
MCH: 25.2 pg — ABNORMAL LOW (ref 26.0–34.0)
MCHC: 28.3 g/dL — ABNORMAL LOW (ref 30.0–36.0)
MCV: 89 fL (ref 80.0–100.0)
Metamyelocytes Relative: 3 %
Monocytes Absolute: 1.1 10*3/uL — ABNORMAL HIGH (ref 0.1–1.0)
Monocytes Relative: 3 %
Myelocytes: 7 %
Neutro Abs: 28.3 10*3/uL — ABNORMAL HIGH (ref 1.7–7.7)
Neutrophils Relative %: 72 %
Platelets: 465 10*3/uL — ABNORMAL HIGH (ref 150–400)
RBC: 2.82 MIL/uL — ABNORMAL LOW (ref 4.22–5.81)
RDW: 25.6 % — ABNORMAL HIGH (ref 11.5–15.5)
WBC: 36.8 10*3/uL — ABNORMAL HIGH (ref 4.0–10.5)
nRBC: 0.3 % — ABNORMAL HIGH (ref 0.0–0.2)

## 2020-09-04 LAB — RESPIRATORY PANEL BY RT PCR (FLU A&B, COVID)
Influenza A by PCR: NEGATIVE
Influenza B by PCR: NEGATIVE
SARS Coronavirus 2 by RT PCR: NEGATIVE

## 2020-09-04 LAB — BASIC METABOLIC PANEL
Anion gap: 9 (ref 5–15)
BUN: 18 mg/dL (ref 8–23)
CO2: 25 mmol/L (ref 22–32)
Calcium: 8.3 mg/dL — ABNORMAL LOW (ref 8.9–10.3)
Chloride: 104 mmol/L (ref 98–111)
Creatinine, Ser: 0.93 mg/dL (ref 0.61–1.24)
GFR, Estimated: >60 mL/min (ref >60)
Glucose, Bld: 115 mg/dL — ABNORMAL HIGH (ref 70–99)
Potassium: 4.2 mmol/L (ref 3.5–5.1)
Sodium: 138 mmol/L (ref 135–145)

## 2020-09-04 LAB — IRON AND TIBC
Iron: 33 ug/dL — ABNORMAL LOW (ref 45–182)
Saturation Ratios: 13 % — ABNORMAL LOW (ref 17.9–39.5)
TIBC: 252 ug/dL (ref 250–450)
UIBC: 219 ug/dL

## 2020-09-04 LAB — HEMOGLOBIN AND HEMATOCRIT, BLOOD
HCT: 26.1 % — ABNORMAL LOW (ref 39.0–52.0)
Hemoglobin: 7.4 g/dL — ABNORMAL LOW (ref 13.0–17.0)

## 2020-09-04 LAB — RETICULOCYTES
Immature Retic Fract: 26.4 % — ABNORMAL HIGH (ref 2.3–15.9)
RBC.: 2.91 MIL/uL — ABNORMAL LOW (ref 4.22–5.81)
Retic Count, Absolute: 114.9 10*3/uL (ref 19.0–186.0)
Retic Ct Pct: 4 % — ABNORMAL HIGH (ref 0.4–3.1)

## 2020-09-04 LAB — FERRITIN: Ferritin: 192 ng/mL (ref 24–336)

## 2020-09-04 LAB — SURGICAL PATHOLOGY

## 2020-09-04 LAB — FLOW CYTOMETRY REQUEST - FLUID (INPATIENT)

## 2020-09-04 MED ORDER — METOPROLOL SUCCINATE ER 25 MG PO TB24
12.5000 mg | ORAL_TABLET | Freq: Every day | ORAL | Status: DC
  Administered 2020-09-04 – 2020-09-06 (×3): 12.5 mg via ORAL
  Filled 2020-09-04 (×3): qty 1

## 2020-09-04 NOTE — TOC Progression Note (Signed)
Transition of Care [facility]) - Progression Note    Patient Details  Name: William Ibarra MRN: [id number] Date of Birth: August 23, 1924  Transition of Care [facility]) CM/SW [place], [place] Phone Number: 09/04/2020, 2:01 PM  Clinical Narrative:    Re: SNF placement CSW received a call from [facility] social worker Licensed conveyancer. The facility has coordinated follow up SNF rehab care with the patient and his adult children. The patient will discharge to [facility]. Patient will need a negative covid test prior to discharge. Physician notified.    Expected Discharge Plan: [place] Barriers to Discharge: Continued Medical Work up, Other (comment) (covid test)  Expected Discharge Plan and Services Expected Discharge Plan: [place] In-house Referral: Clinical Social Work   Post Acute Care Choice: [place] arrangements for the past 2 months: Apartment, [place]                                       Social Determinants of Health (SDOH) Interventions    Readmission Risk Interventions No flowsheet data found.

## 2020-09-04 NOTE — Progress Notes (Signed)
PROGRESS NOTE  William Ibarra CVE:[id number] DOB: December 15, 1923 DOA: 09/02/2020 PCP: Shon Baton, MD  HPI/Recap of past 24 hours: William Ibarra is a 86 y.o. male with medical history significant of chronic A.Fib not on OAC, BPH, HTN, chronically elevated WBC and platelet count who presented from home after a mechanical fall.  He slipped and fell at home and hit his head.  He lives alone.  He had profuse bleeding after his fall and hematoma to the back of his head.  Per chart review he has a history of large amount of bleeding with any minor trauma.  He was in his usual state of health prior to that.  Pressure bandage was applied by EDP.  Hematology consulted for elevated WBC and platelet count.  TRH asked to admit.    Presented with WBC 51k, HGB 9K, platelet count 591K.  Seen by hematology oncology, Dr. Jana Hakim, initiated work-up for possible CML and started [place] for suspected CML.  09/04/20: Seen and examined with his daughter at his bedside.  He has no new complaints while laying in bed.  Was having right foot pain with PT yesterday, imaging showed no acute bony findings.  Right knee with moderate effusion.  If no improvement may benefit from right knee joint aspiration.  Drop in hemoglobin noted this morning down to 7.1 from 7.5 yesterday.  Repeated H&H 7.4/26.1.  Assessment/Plan: Principal Problem:   Anemia Active Problems:   Atrial fibrillation (HCC)   HTN (hypertension)   Laceration of head   Chronic myeloid leukemia (HCC)  Head laceration and hematoma post mechanical fall at home CT head no acute intracranial findings. CT neck no acute bony abnormality. 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 Peripheral smear showing many immature myeloid cells, no blasts seen, consistent with chronic myeloid leukemia. Seen by hematology oncology Dr. Salli Quarry for possible CML started,  BCR/ABL pending, follow results. Currently on [place], started on 09/03/2020 We appreciate medical oncology's assistance.  Acute blood loss anemia from head laceration and hematoma Presented with hemoglobin of 9, dropped to 7.5 on 09/03/2020. Noted drop of hemoglobin this morning 7.1 Obtain iron study Repeat H&H >> 7.4/26.1. Transfuse if hemoglobin 7 or less We will ask for consent prior to transfusion. Continue to monitor H&H  Chronic normocytic anemia, suspect multifactorial secondary to suspected CML, rule out iron deficiency and GI occult source of anemia Obtain iron studies and reticulocyte count Obtain FOBT Started on [place] for suspected CML Continue to monitor H&H Transfuse hemoglobin less than 7 or hemoglobin less than 8 if symptomatic.  Weightbearing pain in right lower extremity Imaging no acute bony findings Mild to moderate knee joint degenerative changes and moderate-sized joint effusion.  No acute bony findings on portable right knee x-ray. Analgesics as needed  Chronic A. fib not on [place] Rate controlled Resume home Toprol-XL 12.5 mg daily, reduced dose due to soft blood pressures. Continue to closely monitor vital signs.  Chronic systolic CHF Last 2D echo done on 06/19/2015 showed LVEF 40% with diffuse hypokinesis Strict I's and O's and daily weight Home p.o. Lasix currently on hold due to soft Bps Closely monitor volume status  Essential hypertension BP is currently soft Hold off home oral antihypertensives Continue to monitor vital signs  BPH Monitor urine output Continue home Flomax.  Ambulatory dysfunction PT OT recommended SNF TOC assisting with SNF placement. Continue PT OT with assistance of for precautions.  Code  Status: DNR  Family Communication: Updated his daughter at bedside on 09/03/2020.  Disposition Plan: SNF.   Consultants:  Hematology oncology  Procedures:  None  Antimicrobials:  None  DVT  prophylaxis: SCDs  Status is: Inpatient.    Dispo:  Patient From: Home  Planned Disposition: [place]  Expected discharge date: 09/04/20  Medically stable for discharge: Yes, ongoing management of suspected CML.    Objective: Vitals:   09/03/20 0949 09/03/20 1232 09/03/20 2206 09/04/20 0650  BP: (!) 114/47 115/63 113/60 (!) 114/57  Pulse: 69 96 79 93  Resp:  18 16 16   Temp:  98.8 F (37.1 C) 98.4 F (36.9 C) 98.7 F (37.1 C)  TempSrc:  Oral Oral Oral  SpO2:  96% 95% 96%  Weight:      Height:        Intake/Output Summary (Last 24 hours) at 09/04/2020 0746 Last data filed at 09/04/2020 1610 Gross per 24 hour  Intake 340 ml  Output 825 ml  Net -485 ml   Filed Weights   09/02/20 2032  Weight: 88.5 kg    Exam:  . General: 86 y.o. year-old male pleasant well-developed well-nourished in no acute distress.  Alert and interactive.  Very hard of hearing.  Head wrap in place. . Cardiovascular: Irregular rate and rhythm no rubs or gallops. Marland Kitchen Respiratory: Clear to auscultation no wheezes or rales.  . Abdomen: Soft nontender normal bowel sounds present.  . Musculoskeletal: Trace lower extremity edema bilaterally. Marland Kitchen Psychiatry: Mood is appropriate for condition and setting.   Data Reviewed: CBC: Recent Labs  Lab 09/02/20 1925 09/03/20 0519 09/04/20 0611  WBC 51.7* 41.9* 36.8*  NEUTROABS 38.2*  --  28.3*  HGB 9.0* 7.5* 7.1*  HCT 31.7* 26.5* 25.1*  MCV 88.1 88.6 89.0  PLT 591* 525* 960*   Basic Metabolic Panel: Recent Labs  Lab 09/02/20 1925 09/03/20 0519 09/04/20 0611  NA 140 142 138  K 4.8 3.5 4.2  CL 101 104 104  CO2 28 26 25   GLUCOSE 122* 109* 115*  BUN 26* 21 18  CREATININE 0.89 0.89 0.93  CALCIUM 8.7* 8.5* 8.3*   GFR: Estimated Creatinine Clearance: 54.9 mL/min (by C-G formula based on SCr of 0.93 mg/dL). Liver Function Tests: No results for input(s): AST, ALT, ALKPHOS, BILITOT, PROT, ALBUMIN in the last 168 hours. No results  for input(s): LIPASE, AMYLASE in the last 168 hours. No results for input(s): AMMONIA in the last 168 hours. Coagulation Profile: Recent Labs  Lab 09/02/20 1925  INR 1.1   Cardiac Enzymes: No results for input(s): CKTOTAL, CKMB, CKMBINDEX, TROPONINI in the last 168 hours. BNP (last 3 results) No results for input(s): PROBNP in the last 8760 hours. HbA1C: No results for input(s): HGBA1C in the last 72 hours. CBG: No results for input(s): GLUCAP in the last 168 hours. Lipid Profile: No results for input(s): CHOL, HDL, LDLCALC, TRIG, CHOLHDL, LDLDIRECT in the last 72 hours. Thyroid Function Tests: No results for input(s): TSH, T4TOTAL, FREET4, T3FREE, THYROIDAB in the last 72 hours. Anemia Panel: No results for input(s): VITAMINB12, FOLATE, FERRITIN, TIBC, IRON, RETICCTPCT in the last 72 hours. Urine analysis:    Component Value Date/Time   COLORURINE AMBER (A) 10/26/2014 1727   APPEARANCEUR CLEAR 10/26/2014 1727   LABSPEC 1.020 10/26/2014 1727   PHURINE 5.5 10/26/2014 1727   GLUCOSEU NEGATIVE 10/26/2014 1727   HGBUR LARGE (A) 10/26/2014 1727   BILIRUBINUR NEGATIVE 10/26/2014 1727   KETONESUR NEGATIVE 10/26/2014 1727   PROTEINUR  NEGATIVE 10/26/2014 1727   UROBILINOGEN 1.0 10/26/2014 1727   NITRITE NEGATIVE 10/26/2014 1727   LEUKOCYTESUR SMALL (A) 10/26/2014 1727   Sepsis Labs: @LABRCNTIP (procalcitonin:4,lacticidven:4)  ) Recent Results (from the past 240 hour(s))  Respiratory Panel by RT PCR (Flu A&B, Covid) - Nasopharyngeal Swab     Status: None   Collection Time: 09/02/20  9:47 PM   Specimen: Nasopharyngeal Swab  Result Value Ref Range Status   SARS Coronavirus 2 by RT PCR NEGATIVE NEGATIVE Final    Comment: (NOTE) SARS-CoV-2 target nucleic acids are NOT DETECTED.  The SARS-CoV-2 RNA is generally detectable in upper respiratoy specimens during the acute phase of infection. The lowest concentration of SARS-CoV-2 viral copies this assay can detect is 131 copies/mL.  A negative result does not preclude SARS-Cov-2 infection and should not be used as the sole basis for treatment or other patient management decisions. A negative result may occur with  improper specimen collection/handling, submission of specimen other than nasopharyngeal swab, presence of viral mutation(s) within the areas targeted by this assay, and inadequate number of viral copies (<131 copies/mL). A negative result must be combined with clinical observations, patient history, and epidemiological information. The expected result is Negative.  Fact Sheet for Patients:  PinkCheek.be  Fact Sheet for Healthcare Providers:  GravelBags.it  This test is no t yet approved or cleared by the [place] FDA and  has been authorized for detection and/or diagnosis of SARS-CoV-2 by FDA under an Emergency Use Authorization (EUA). This EUA will remain  in effect (meaning this test can be used) for the duration of the COVID-19 declaration under Section 564(b)(1) of the Act, 21 U.S.C. section 360bbb-3(b)(1), unless the authorization is terminated or revoked sooner.     Influenza A by PCR NEGATIVE NEGATIVE Final   Influenza B by PCR NEGATIVE NEGATIVE Final    Comment: (NOTE) The Xpert Xpress SARS-CoV-2/FLU/RSV assay is intended as an aid in  the diagnosis of influenza from Nasopharyngeal swab specimens and  should not be used as a sole basis for treatment. Nasal washings and  aspirates are unacceptable for Xpert Xpress SARS-CoV-2/FLU/RSV  testing.  Fact Sheet for Patients: PinkCheek.be  Fact Sheet for Healthcare Providers: GravelBags.it  This test is not yet approved or cleared by the [place] FDA and  has been authorized for detection and/or diagnosis of SARS-CoV-2 by  FDA under an Emergency Use Authorization (EUA). This EUA will remain  in effect (meaning this test  can be used) for the duration of the  Covid-19 declaration under Section 564(b)(1) of the Act, 21  U.S.C. section 360bbb-3(b)(1), unless the authorization is  terminated or revoked. Performed at [facility], [place] [address] [place], [place] [address]       Studies: DG Knee Right Port  Result Date: 09/03/2020 CLINICAL DATA:  [place].  Right knee pain. EXAM: PORTABLE RIGHT KNEE - 1-2 VIEW COMPARISON:  None. FINDINGS: Mild to moderate knee joint degenerative changes but no acute fracture, bone lesion or osteochondral abnormality. There is a moderate-sized suprapatellar knee joint effusion noted. IMPRESSION: 1. Mild to moderate knee joint degenerative changes and moderate-sized joint effusion. 2. No acute bony findings. Electronically Signed   By: Marijo Sanes M.D.   On: 09/03/2020 13:13   DG Tibia/Fibula Right Port  Result Date: 09/03/2020 CLINICAL DATA:  Tripped yesterday and injured leg. EXAM: PORTABLE RIGHT TIBIA AND FIBULA - 2 VIEW COMPARISON:  None. FINDINGS: The knee and ankle joints are grossly maintained. Moderate degenerative changes. No acute  fracture of the tibia or fibula is identified. IMPRESSION: No acute bony findings. Electronically Signed   By: Marijo Sanes M.D.   On: 09/03/2020 13:12   DG HIP PORT UNILAT WITH PELVIS 1V RIGHT  Result Date: 09/03/2020 CLINICAL DATA:  [place] yesterday.  Right hip pain. EXAM: DG HIP (WITH OR WITHOUT PELVIS) 1V PORT RIGHT COMPARISON:  None. FINDINGS: Both hips are normally located. No acute hip fracture is identified. The pubic symphysis and SI joints are intact. No definite pelvic fractures. IMPRESSION: No acute bony findings. Electronically Signed   By: Marijo Sanes M.D.   On: 09/03/2020 13:14    Scheduled Meds: . allopurinol  150 mg Oral Daily  . imatinib  200 mg Oral Q breakfast  . influenza vaccine adjuvanted  0.5 mL Intramuscular Tomorrow-1000  . tamsulosin  0.4 mg Oral QHS    Continuous Infusions:   LOS: 1 day      Kayleen Memos, MD [facility] Pager [phone]  If 7PM-7AM, please contact night-coverage [email or web address] Password [facility] 09/04/2020, 7:46 AM

## 2020-09-04 NOTE — Telephone Encounter (Signed)
Scheduled appt per 10/31 sch msg - pt daughter is aware of appt - unsure where pt will be

## 2020-09-04 NOTE — Progress Notes (Signed)
Physical Therapy Treatment Patient Details Name: William Ibarra MRN: [id number] DOB: 1924-08-12 Today's Date: 09/04/2020    History of Present Illness William Ibarra is a 86 y.o. male hx of afib not on blood thinners, diabetes, hypertension, CLL here presenting with fall. Patient states that he may have slipped and fell and hit his head. Patient was noted to have a hematoma in the back of his head. He also has some skin tears in his leg. He was noted to have profuse bleeding. Patient is not currently on blood thinners. Pt admitted with Head laceration and hematoma post mechanical fall at home    PT Comments    Pt progressing toward PT goals. Continues to be limited by RLE although improved form previous session. Continue PT POC--SNF   Follow Up Recommendations  SNF     Equipment Recommendations  None recommended by PT (defer to SNF)    Recommendations for Other Services       Precautions / Restrictions Precautions Precautions: Fall Restrictions Weight Bearing Restrictions: No    Mobility  Bed Mobility Overal bed mobility: Needs Assistance Bed Mobility: Supine to Sit;Sit to Supine     Supine to sit: Mod assist;+2 for physical assistance;+2 for safety/equipment Sit to supine: Mod assist;+2 for physical assistance;+2 for safety/equipment   General bed mobility comments: partial roll and use of rail, assist for trunk and LEs in both directions, incr time, cues to self assist   Transfers Overall transfer level: Needs assistance Equipment used: Rolling walker (2 wheeled) Transfers: Sit to/from Stand Sit to Stand: Max assist;+2 physical assistance;+2 safety/equipment;From elevated surface         General transfer comment: assist to rise and steady as well as control descent, performed twice; pt able to [facility] minimally(however more than last session) on RLE however  unable to wt shift to RLE enough to take a step or pivot to recliner  Ambulation/Gait              General Gait Details: unable d/t RLE/knee pain    Stairs             Wheelchair Mobility    Modified Rankin (Stroke Patients Only)       Balance     Sitting balance-Leahy Scale: Fair     Standing balance support: No upper extremity supported Standing balance-Leahy Scale: Zero Standing balance comment: reliant on UEs and external support                             Cognition Arousal/Alertness: Awake/alert Behavior During Therapy: WFL for tasks assessed/performed Overall Cognitive Status: Within Functional Limits for tasks assessed                                 General Comments: pleasant and willing despite pain       Exercises General Exercises - Lower Extremity Heel Slides: AAROM;Right;5 reps    General Comments        Pertinent Vitals/Pain Pain Assessment: Faces Faces Pain Scale: Hurts even more Pain Location: R knee Pain Descriptors / Indicators: Grimacing;Guarding;Sore Pain Intervention(s): Limited activity within patient's tolerance;Monitored during session;Repositioned    Home Living                      Prior Function            PT Goals (current goals can  now be found in the care plan section) Acute Rehab PT Goals Patient Stated Goal: none stated  PT Goal Formulation: With patient/family Time For Goal Achievement: 09/17/20 Potential to Achieve Goals: Good Progress towards PT goals: Progressing toward goals    Frequency    Min 2X/week      PT Plan Current plan remains appropriate    Co-evaluation              AM-PAC PT "6 Clicks" Mobility   Outcome Measure  Help needed turning from your back to your side while in a flat bed without using bedrails?: A Lot Help needed moving from lying on your back to sitting on the side of a flat bed without using bedrails?: A Lot Help needed moving to and from a bed to a chair (including a wheelchair)?: A Lot Help needed standing up from a chair  using your arms (e.g., wheelchair or bedside chair)?: A Lot Help needed to walk in hospital room?: Total Help needed climbing 3-5 steps with a railing? : Total 6 Click Score: 10    End of Session Equipment Utilized During Treatment: Gait belt Activity Tolerance: Patient limited by pain Patient left: in bed;with call bell/phone within reach;with bed alarm set;with family/visitor present Nurse Communication: Mobility status PT Visit Diagnosis: Other abnormalities of gait and mobility (R26.89);Pain Pain - Right/Left: Right Pain - part of body: Knee     Time: 1526-1550 PT Time Calculation (min) (ACUTE ONLY): 24 min  Charges:  $Therapeutic Activity: 23-37 mins                     Baxter Flattery, PT  Acute Rehab Dept ([facility]) [phone] Pager [phone]  09/04/2020    [facility] 09/04/2020, 4:38 PM

## 2020-09-05 DIAGNOSIS — D649 Anemia, unspecified: Secondary | ICD-10-CM

## 2020-09-05 DIAGNOSIS — C9210 Chronic myeloid leukemia, BCR/ABL-positive, not having achieved remission: Secondary | ICD-10-CM | POA: Diagnosis not present

## 2020-09-05 LAB — CBC WITH DIFFERENTIAL/PLATELET
Abs Immature Granulocytes: 4.9 10*3/uL — ABNORMAL HIGH (ref 0.00–0.07)
Abs Immature Granulocytes: 3.1 10*3/uL — ABNORMAL HIGH (ref 0.00–0.07)
Band Neutrophils: 3 %
Band Neutrophils: 6 %
Basophils Absolute: 0.8 10*3/uL — ABNORMAL HIGH (ref 0.0–0.1)
Basophils Absolute: 0.9 10*3/uL — ABNORMAL HIGH (ref 0.0–0.1)
Basophils Relative: 2 %
Basophils Relative: 2 %
Eosinophils Absolute: 1.1 10*3/uL — ABNORMAL HIGH (ref 0.0–0.5)
Eosinophils Absolute: 0.4 10*3/uL (ref 0.0–0.5)
Eosinophils Relative: 3 %
Eosinophils Relative: 1 %
HCT: 24.6 % — ABNORMAL LOW (ref 39.0–52.0)
HCT: 26 % — ABNORMAL LOW (ref 39.0–52.0)
Hemoglobin: 7.1 g/dL — ABNORMAL LOW (ref 13.0–17.0)
Hemoglobin: 7.3 g/dL — ABNORMAL LOW (ref 13.0–17.0)
Lymphocytes Relative: 5 %
Lymphocytes Relative: 8 %
Lymphs Abs: 1.9 10*3/uL (ref 0.7–4.0)
Lymphs Abs: 3.5 10*3/uL (ref 0.7–4.0)
MCH: 25.1 pg — ABNORMAL LOW (ref 26.0–34.0)
MCH: 25.1 pg — ABNORMAL LOW (ref 26.0–34.0)
MCHC: 28.9 g/dL — ABNORMAL LOW (ref 30.0–36.0)
MCHC: 28.1 g/dL — ABNORMAL LOW (ref 30.0–36.0)
MCV: 86.9 fL (ref 80.0–100.0)
MCV: 89.3 fL (ref 80.0–100.0)
Metamyelocytes Relative: 4 %
Metamyelocytes Relative: 3 %
Monocytes Absolute: 1.1 10*3/uL — ABNORMAL HIGH (ref 0.1–1.0)
Monocytes Absolute: 0.4 10*3/uL (ref 0.1–1.0)
Monocytes Relative: 3 %
Monocytes Relative: 1 %
Myelocytes: 6 %
Myelocytes: 4 %
Neutro Abs: 27.8 10*3/uL — ABNORMAL HIGH (ref 1.7–7.7)
Neutro Abs: 35.8 10*3/uL — ABNORMAL HIGH (ref 1.7–7.7)
Neutrophils Relative %: 71 %
Neutrophils Relative %: 75 %
Platelets: 442 10*3/uL — ABNORMAL HIGH (ref 150–400)
Platelets: 494 10*3/uL — ABNORMAL HIGH (ref 150–400)
Promyelocytes Relative: 3 %
RBC: 2.83 MIL/uL — ABNORMAL LOW (ref 4.22–5.81)
RBC: 2.91 MIL/uL — ABNORMAL LOW (ref 4.22–5.81)
RDW: 25.6 % — ABNORMAL HIGH (ref 11.5–15.5)
RDW: 25.5 % — ABNORMAL HIGH (ref 11.5–15.5)
WBC: 37.5 10*3/uL — ABNORMAL HIGH (ref 4.0–10.5)
WBC: 44.2 10*3/uL — ABNORMAL HIGH (ref 4.0–10.5)
nRBC: 0.3 % — ABNORMAL HIGH (ref 0.0–0.2)
nRBC: 0.2 % (ref 0.0–0.2)

## 2020-09-05 LAB — BASIC METABOLIC PANEL
Anion gap: 9 (ref 5–15)
BUN: 19 mg/dL (ref 8–23)
CO2: 26 mmol/L (ref 22–32)
Calcium: 8.4 mg/dL — ABNORMAL LOW (ref 8.9–10.3)
Chloride: 102 mmol/L (ref 98–111)
Creatinine, Ser: 1.01 mg/dL (ref 0.61–1.24)
GFR, Estimated: >60 mL/min (ref >60)
Glucose, Bld: 127 mg/dL — ABNORMAL HIGH (ref 70–99)
Potassium: 4.1 mmol/L (ref 3.5–5.1)
Sodium: 137 mmol/L (ref 135–145)

## 2020-09-05 LAB — URIC ACID: Uric Acid, Serum: 8.4 mg/dL (ref 3.7–8.6)

## 2020-09-05 LAB — PREPARE RBC (CROSSMATCH)

## 2020-09-05 LAB — LACTATE DEHYDROGENASE: LDH: 397 U/L — ABNORMAL HIGH (ref 98–192)

## 2020-09-05 MED ORDER — SODIUM CHLORIDE 0.9% IV SOLUTION: Freq: Once | INTRAVENOUS | Status: DC

## 2020-09-05 MED ORDER — HYDROGEN PEROXIDE 3 % EX SOLN
CUTANEOUS | Status: AC
  Administered 2020-09-05: 1
  Filled 2020-09-05: qty 473

## 2020-09-05 MED ORDER — BACITRACIN ZINC 500 UNIT/GM EX OINT
TOPICAL_OINTMENT | Freq: Two times a day (BID) | CUTANEOUS | Status: DC
  Filled 2020-09-05 (×2): qty 28.35

## 2020-09-05 MED ORDER — SODIUM CHLORIDE 0.9 % IV SOLN
510.0000 mg | Freq: Once | INTRAVENOUS | Status: AC
  Administered 2020-09-05: 19:00:00 510 mg via INTRAVENOUS
  Filled 2020-09-05: qty 510

## 2020-09-05 NOTE — Consult Note (Addendum)
Consult Note  William Ibarra 01-28-1924  [id number].    Requesting MD: Nevada Crane  Chief Complaint/Reason for Consult: scalp laceration   HPI:  Patient is a 86 year old male who presented to [facility] 10/30 after falling and striking head. CT head and neck were negative. Patient was noted to have hematoma to back of head and small laceration with profuse bleeding. Laceration was stapled by EDP 10/30 with 3 staples. Per patients daughter the EDP also placed silver nitrate and some quick clot/chemical cautery agents on her scalp lac to stop the bleeding. Hgb dropped to 7.1 this AM and patient is currently receiving 1 unit PRBC. Unclear how often dressing has been changed but appears that area has not been cleansed since staples were placed. Trauma/general surgery asked to evaluation scalp laceration.  PMH significant for A. Fib not on anticoagulation, CLL not currently on treatment due to age, BPH, HTN, Hx of psoriasis. NKDA. Hx of melanoma excision to R eye. Patient reportedly lives alone in an apartment, currently plans are for SNF placement on discharge.   ROS: Review of Systems  Endo/Heme/Allergies: Bruises/bleeds easily.  All other systems reviewed and are negative.   Family History  Problem Relation Age of Onset  . Bleeding Disorder Neg Hx     Past Medical History:  Diagnosis Date  . Atrial fibrillation ([place])   . BPH (benign prostatic hypertrophy)   . Diabetes mellitus   . Diverticulosis of colon    from a prior colonoscopy  . History of blood transfusion ~ 10/2013; 10/24/2014   "related to blood thinners"  . Hydrocele   . Hydrocele   . Hypertension   . Hypotension 02/05/2012  . Impaired glucose tolerance   . Overactive bladder   . Psoriasis   . Rosacea   . Spermatocele     Past Surgical History:  Procedure Laterality Date  . CATARACT EXTRACTION W/ INTRAOCULAR LENS  IMPLANT, BILATERAL Bilateral    "successful in left eye; redid right"  . HYDROCELE EXCISION / REPAIR     . MELANOMA EXCISION Right    eye  . TONSILLECTOMY      Social History:  reports that he quit smoking about 28 years ago. His smoking use included cigarettes. He has a 15.00 pack-year smoking history. He has never used smokeless tobacco. He reports current alcohol use of about 7.0 standard drinks of alcohol per week. He reports that he does not use drugs.  Allergies: No Known Allergies  Medications Prior to Admission  Medication Sig Dispense Refill  . furosemide (LASIX) 20 MG tablet Take 20 mg by mouth daily.    Marland Kitchen losartan-hydrochlorothiazide (HYZAAR) 100-25 MG tablet Take 1 tablet by mouth daily.     . metoprolol succinate (TOPROL-XL) 50 MG 24 hr tablet Take 50 mg by mouth daily. .    . potassium chloride (MICRO-K) 10 MEQ CR capsule Take 10 mEq by mouth daily.     . tamsulosin (FLOMAX) 0.4 MG CAPS capsule Take 1 capsule (0.4 mg total) by mouth at bedtime. 30 capsule 1    Blood pressure (!) 120/44, pulse 70, temperature 98.4 F (36.9 C), temperature source Oral, resp. rate 16, height 6' 1"  (1.854 m), weight 88.5 kg, SpO2 99 %. Physical Exam:  General: pleasant, WD, elderly malewho is laying in bed in NAD HEENT: scalp laceration with staples in place covered in a thick layer of debris, suspect related to the large amount of topical agents used to achieve hemostasis on admission.  There is a 3 cm hematoma inferior to the incision. Incision has no active bleeding, no purulence, no cellulitis, no warmth. Peroxide, soap, and water were used to soften overlying skin debris and remove over 65% of it. Patient tolerated this without complication.  Heart: regular, rate, and rhythm. No obvious murmurs, gallops, or rubs noted.  Palpable radial and pedal pulses bilaterally Lungs: CTAB, no wheezes, rhonchi, or rales noted.  Respiratory effort nonlabored Abd: soft, NT, ND, +BS, no  organomegaly MS: all 4 extremities are symmetrical with no cyanosis, clubbing, or edema. Skin: warm and dry with no  masses, lesions, or rashes Neuro: Cranial nerves 2-12 grossly intact, sensation is normal throughout Psych: A&Ox3 with an appropriate affect.   Results for orders placed or performed during the hospital encounter of 09/02/20 (from the past 48 hour(s))  Surgical pathology     Status: None   Collection Time: 09/04/20 12:00 AM  Result Value Ref Range   SURGICAL PATHOLOGY      Surgical Pathology CASE: [id number] PATIENT: [name] Flow Pathology Report     Clinical history: evaluate for CML   DIAGNOSIS:  -  No monoclonal B-cell, phenotypically aberrant T-cell, or distinct blast population identified -  See comment  COMMENT:  Review of the peripheral blood raises the possibility of chronic myeloid leukemia; correlation with BCR/ABL testing is recommended.  There is no evidence of acute leukemia or lymphoma.  GATING AND PHENOTYPIC ANALYSIS:  Gated population: Flow cytometric immunophenotyping is performed using antibodies to the antigens listed in the table below. Electronic gates are placed around a cell cluster displaying light scatter properties corresponding to: lymphocytes and blasts  Abnormal Cells in gated population: N/A  Phenotype of Abnormal Cells: N/A                        Lymphoid Antigens       Myeloid Antigens Miscellaneous CD2  tested    CD10 tested    CD11b     tested    CD45 tested CD3  tested     CD19 tested    CD11c     ND   HLA-Dr    tested CD4  tested    CD20 tested    CD13 tested    CD34 tested CD5  tested    CD22 ND   CD14 tested    CD38 tested CD7  tested    CD79b     ND   CD15 tested    CD138     ND CD8  tested    CD103     ND   CD16 tested    TdT  ND CD25 ND   CD200     tested    CD33 tested    CD123     tested TCRab     ND   sKappa    tested    CD64 tested    CD41 ND TCRgd     tested    sLambda   tested    CD117     tested    CD61 ND CD56 tested    cKappa    ND   MPO  ND   CD71 ND CD57 ND   cLambda   ND             CD235a     ND    GROSS DESCRIPTION:  One lavender top tube submitted from [employer] for leukemia testing.    Final Diagnosis  performed by Thressa Sheller, MD.   Electronically signed 09/04/2020 Technical component performed at [facility], [place] [address], [place], [place] [address].  Professional component performed at [employer] [facility], [place] [address] [place], [place] [address].   Lymphocyte subsets,flow cytometry (In[place])     Status: None   Collection Time: 09/04/20  4:23 AM  Result Value Ref Range   Lymphocyte subsets SEE SEPARATE REPORT     Comment: PATHOLOGY REPORT Performed at [place] [place] [address], [place], [place] [address]   CBC with Differential/Platelet     Status: Abnormal   Collection Time: 09/04/20  6:11 AM  Result Value Ref Range   WBC 36.8 (H) 4.0 - 10.5 K/uL   RBC 2.82 (L) 4.22 - 5.81 MIL/uL   Hemoglobin 7.1 (L) 13.0 - 17.0 g/dL   HCT 25.1 (L) 39 - 52 %   MCV 89.0 80.0 - 100.0 fL   MCH 25.2 (L) 26.0 - 34.0 pg   MCHC 28.3 (L) 30.0 - 36.0 g/dL   RDW 25.6 (H) 11.5 - 15.5 %   Platelets 465 (H) 150 - 400 K/uL   nRBC 0.3 (H) 0.0 - 0.2 %   Neutrophils Relative % 72 %   Neutro Abs 28.3 (H) 1.7 - 7.7 K/uL   Band Neutrophils 5 %   Lymphocytes Relative 7 %   Lymphs Abs 2.6 0.7 - 4.0 K/uL   Monocytes Relative 3 %   Monocytes Absolute 1.1 (H) 0.1 - 1.0 K/uL   Eosinophils Relative 2 %   Eosinophils Absolute 0.7 (H) 0.0 - 0.5 K/uL   Basophils Relative 1 %   Basophils Absolute 0.4 (H) 0.0 - 0.1 K/uL   WBC Morphology MILD LEFT SHIFT (1-5% METAS, OCC MYELO, OCC BANDS)    Metamyelocytes Relative 3 %   Myelocytes 7 %   Abs Immature Granulocytes 3.70 (H) 0.00 - 0.07 K/uL   Polychromasia PRESENT    Target Cells PRESENT    Ovalocytes PRESENT     Comment: Performed at [facility], [place] [address], [place], [place] [address]  Basic metabolic panel     Status: Abnormal   Collection Time: 09/04/20  6:11  AM  Result Value Ref Range   Sodium 138 135 - 145 mmol/L   Potassium 4.2 3.5 - 5.1 mmol/L   Chloride 104 98 - 111 mmol/L   CO2 25 22 - 32 mmol/L   Glucose, Bld 115 (H) 70 - 99 mg/dL    Comment: Glucose reference range applies only to samples taken after fasting for at least 8 hours.   BUN 18 8 - 23 mg/dL   Creatinine, Ser 0.93 0.61 - 1.24 mg/dL   Calcium 8.3 (L) 8.9 - 10.3 mg/dL   GFR, Estimated >60 >60 mL/min    Comment: (NOTE) Calculated using the CKD-EPI Creatinine Equation (2021)    Anion gap 9 5 - 15    Comment: Performed at [facility], [place] [address], [place], [place] [address]  Iron and TIBC     Status: Abnormal   Collection Time: 09/04/20  6:11 AM  Result Value Ref Range   Iron 33 (L) 45 - 182 ug/dL   TIBC 252 250 - 450 ug/dL   Saturation Ratios 13 (L) 17.9 - 39.5 %   UIBC 219 ug/dL    Comment: Performed at [facility], [place] [address], [place], [place] [address]  Ferritin     Status: None  Collection Time: 09/04/20  6:11 AM  Result Value Ref Range   Ferritin 192 24 - 336 ng/mL    Comment: Performed at [facility], [place] [address] [place], [place] [address]  Hemoglobin and hematocrit, blood     Status: Abnormal   Collection Time: 09/04/20  8:36 AM  Result Value Ref Range   Hemoglobin 7.4 (L) 13.0 - 17.0 g/dL   HCT 26.1 (L) 39 - 52 %    Comment: Performed at [facility], [place] [address] [place], [place] [address]  Type and screen [place]     Status: None (Preliminary result)   Collection Time: 09/04/20  8:36 AM  Result Value Ref Range   ABO/RH(D) A POS    Antibody Screen NEG    Sample Expiration 09/07/2020,2359    Unit Number [id number]    Blood Component Type RED CELLS,LR    Unit division 00    Status of Unit ISSUED    Transfusion Status OK TO TRANSFUSE    Crossmatch Result      Compatible Performed at [facility], [place] [address] [place], [place] [address]   Reticulocytes     Status: Abnormal   Collection Time: 09/04/20  8:36 AM  Result Value Ref Range   Retic Ct Pct 4.0 (H) 0.4 - 3.1 %   RBC. 2.91 (L) 4.22 - 5.81 MIL/uL   Retic Count, Absolute 114.9 19.0 - 186.0 K/uL   Immature Retic Fract 26.4 (H) 2.3 - 15.9 %    Comment: Performed at [facility], [place] [address] [place], [place] [address]  Respiratory Panel by RT PCR (Flu A&B, Covid) - Nasopharyngeal Swab     Status: None   Collection Time: 09/04/20  3:25 PM   Specimen: Nasopharyngeal Swab  Result Value Ref Range   SARS Coronavirus 2 by RT PCR NEGATIVE NEGATIVE    Comment: (NOTE) SARS-CoV-2 target nucleic acids are NOT DETECTED.  The SARS-CoV-2 RNA is generally detectable in upper respiratoy specimens during the acute phase of infection. The lowest concentration of SARS-CoV-2 viral copies this assay can detect is 131 copies/mL. A negative result does not preclude SARS-Cov-2 infection and should not be used as the sole basis for treatment or other patient management decisions. A negative result may occur with  improper specimen collection/handling, submission of specimen other than nasopharyngeal swab, presence of viral mutation(s) within the areas targeted by this assay, and inadequate number of viral copies (<131 copies/mL). A negative result must be combined with clinical observations, patient history, and epidemiological information. The expected result is Negative.  Fact Sheet for Patients:  PinkCheek.be  Fact Sheet for Healthcare Providers:  GravelBags.it  This test is no t yet approved or cleared by the [place] FDA and  has been authorized for detection and/or diagnosis of SARS-CoV-2 by FDA under an Emergency Use Authorization (EUA). This EUA will remain  in effect (meaning this test can be used) for the duration of the COVID-19 declaration under Section  564(b)(1) of the Act, 21 U.S.C. section 360bbb-3(b)(1), unless the authorization is terminated or revoked sooner.     Influenza A by PCR NEGATIVE NEGATIVE   Influenza B by PCR NEGATIVE NEGATIVE    Comment: (NOTE) The Xpert Xpress SARS-CoV-2/FLU/RSV assay is intended as an aid in  the diagnosis of influenza from Nasopharyngeal swab specimens and  should not be used as a sole basis for treatment. Nasal washings and  aspirates are unacceptable for  Xpert Xpress SARS-CoV-2/FLU/RSV  testing.  Fact Sheet for Patients: PinkCheek.be  Fact Sheet for Healthcare Providers: GravelBags.it  This test is not yet approved or cleared by the [place] FDA and  has been authorized for detection and/or diagnosis of SARS-CoV-2 by  FDA under an Emergency Use Authorization (EUA). This EUA will remain  in effect (meaning this test can be used) for the duration of the  Covid-19 declaration under Section 564(b)(1) of the Act, 21  U.S.C. section 360bbb-3(b)(1), unless the authorization is  terminated or revoked. Performed at [facility], [place] [address] [place], [place] [address]   CBC with Differential/Platelet     Status: Abnormal   Collection Time: 09/05/20  4:41 AM  Result Value Ref Range   WBC 37.5 (H) 4.0 - 10.5 K/uL   RBC 2.83 (L) 4.22 - 5.81 MIL/uL   Hemoglobin 7.1 (L) 13.0 - 17.0 g/dL   HCT 24.6 (L) 39 - 52 %   MCV 86.9 80.0 - 100.0 fL   MCH 25.1 (L) 26.0 - 34.0 pg   MCHC 28.9 (L) 30.0 - 36.0 g/dL   RDW 25.6 (H) 11.5 - 15.5 %   Platelets 442 (H) 150 - 400 K/uL   nRBC 0.3 (H) 0.0 - 0.2 %   Neutrophils Relative % 71 %   Neutro Abs 27.8 (H) 1.7 - 7.7 K/uL   Band Neutrophils 3 %   Lymphocytes Relative 5 %   Lymphs Abs 1.9 0.7 - 4.0 K/uL   Monocytes Relative 3 %   Monocytes Absolute 1.1 (H) 0.1 - 1.0 K/uL   Eosinophils Relative 3 %   Eosinophils Absolute 1.1 (H) 0.0 - 0.5 K/uL   Basophils Relative 2 %    Basophils Absolute 0.8 (H) 0.0 - 0.1 K/uL   WBC Morphology      MODERATE LEFT SHIFT (>5% METAS AND MYELOS,OCC PRO NOTED)   Metamyelocytes Relative 4 %   Myelocytes 6 %   Promyelocytes Relative 3 %   Abs Immature Granulocytes 4.90 (H) 0.00 - 0.07 K/uL   Tear Drop Cells PRESENT    Polychromasia PRESENT    Target Cells PRESENT    Ovalocytes PRESENT     Comment: Performed at [facility], [place] [address], [place], [place] [address]  Basic metabolic panel     Status: Abnormal   Collection Time: 09/05/20  4:41 AM  Result Value Ref Range   Sodium 137 135 - 145 mmol/L   Potassium 4.1 3.5 - 5.1 mmol/L   Chloride 102 98 - 111 mmol/L   CO2 26 22 - 32 mmol/L   Glucose, Bld 127 (H) 70 - 99 mg/dL    Comment: Glucose reference range applies only to samples taken after fasting for at least 8 hours.   BUN 19 8 - 23 mg/dL   Creatinine, Ser 1.01 0.61 - 1.24 mg/dL   Calcium 8.4 (L) 8.9 - 10.3 mg/dL   GFR, Estimated >60 >60 mL/min    Comment: (NOTE) Calculated using the CKD-EPI Creatinine Equation (2021)    Anion gap 9 5 - 15    Comment: Performed at [facility], [place] [address] [place], [place] [address]  Prepare RBC (crossmatch)     Status: None   Collection Time: 09/05/20 10:23 AM  Result Value Ref Range   Order Confirmation      ORDER PROCESSED BY BLOOD BANK Performed at [facility], [place] [address] [place], [place] [address]   CBC with Differential/Platelet  Status: Abnormal   Collection Time: 09/05/20  1:52 PM  Result Value Ref Range   WBC 44.2 (H) 4.0 - 10.5 K/uL   RBC 2.91 (L) 4.22 - 5.81 MIL/uL   Hemoglobin 7.3 (L) 13.0 - 17.0 g/dL   HCT 26.0 (L) 39 - 52 %   MCV 89.3 80.0 - 100.0 fL   MCH 25.1 (L) 26.0 - 34.0 pg   MCHC 28.1 (L) 30.0 - 36.0 g/dL   RDW 25.5 (H) 11.5 - 15.5 %   Platelets 494 (H) 150 - 400 K/uL   nRBC 0.2 0.0 - 0.2 %   Neutrophils Relative % 75 %   Neutro Abs 35.8 (H) 1.7 - 7.7 K/uL   Band Neutrophils 6  %   Lymphocytes Relative 8 %   Lymphs Abs 3.5 0.7 - 4.0 K/uL   Monocytes Relative 1 %   Monocytes Absolute 0.4 0.1 - 1.0 K/uL   Eosinophils Relative 1 %   Eosinophils Absolute 0.4 0.0 - 0.5 K/uL   Basophils Relative 2 %   Basophils Absolute 0.9 (H) 0.0 - 0.1 K/uL   WBC Morphology MILD LEFT SHIFT (1-5% METAS, OCC MYELO, OCC BANDS)    Metamyelocytes Relative 3 %   Myelocytes 4 %   Abs Immature Granulocytes 3.10 (H) 0.00 - 0.07 K/uL   Polychromasia PRESENT    Ovalocytes PRESENT     Comment: Performed at [facility], [place] [address], [place], [place] [address]  Lactate dehydrogenase     Status: Abnormal   Collection Time: 09/05/20  1:52 PM  Result Value Ref Range   LDH 397 (H) 98 - 192 U/L    Comment: Performed at [facility], [place] [address] [place], [place] [address]  Uric acid     Status: None   Collection Time: 09/05/20  1:52 PM  Result Value Ref Range   Uric Acid, Serum 8.4 3.7 - 8.6 mg/dL    Comment: Performed at [facility], [place] [address], [place], [place] [address]   No results found.    Assessment/Plan Scalp laceration - stapled in ED 10/30 - 3 staples  - suspect overlying debris is related to the large amount topical hemostatic agents used to achieve hemostasis. There are no signs of infection or complication. Would recommend washing the patients hair with soap and water and continuing to use 4x4's soaked with saline and peroxide to soften the debris before manually debriding. RN and [facility] staff can continue these efforts - greatly appreciate [place] RN consulting and spending a large amount of time debriding.  I do not think this scalp laceration is an indication for him to remain admitted to the hospital if he is otherwise stable for D/C to SNF. Would consider removing staples after 10 days total rather than the typical 5-7 given how fragile his tissue is and his risk factors for slow/poor wound healing.  - general  surgery will sign off, please call as needed.   Obie Dredge, [facility]

## 2020-09-05 NOTE — Progress Notes (Signed)
PROGRESS NOTE  William Ibarra GQB:[id number] DOB: [birth year] DOA: 09/02/2020 PCP: Shon Baton, MD  HPI/Recap of past 24 hours: William Ibarra is a 86 y.o. male with medical history significant of chronic A.Fib not on OAC, BPH, HTN, chronically elevated WBC and platelet count who presented from home after a mechanical fall.  He slipped and fell at home and hit his head.  He lives alone.  He had profuse bleeding after his fall and hematoma to the back of his head.  Per chart review he has a history of large amount of bleeding with any minor trauma.  He was in his usual state of health prior to that.  Pressure bandage was applied by EDP.  Hematology consulted for elevated WBC and platelet count.  TRH asked to admit.    Presented with WBC 51k, HGB 9K, platelet count 591K.  Seen by hematology/oncology, Dr. Jana Hakim, initiated work-up for suspected CML and started [place].  Review of peripheral smear most consistent with chronic myeloid leukemia per oncology.  Patient opted against bone marrow biopsy, BCR-ABL pending.  Was started on Gleevec at half dose 200 mg daily on 09/03/2020, also started on allopurinol 150 mg daily on 09/03/2020.  Hospital course complicated by right knee pain, imaging showed moderate joint effusion with no acute bony findings.  09/05/20: Drop in hemoglobin this morning down to 7.1.  FOBT ordered to rule out GI etiology, pending.  Will transfuse 1 unit of PRBC to maintain hemoglobin above 8 for symptomatic anemia. Right knee pain, started on allopurinol on 09/03/2020.  Tests ordered for tomorrow per hematology will be obtained today, follow results.    Hematology recommended to discharge on both Gleevec and allopurinol as written, and follow up outpatient.   Consulted wound care specialist and contacted general surgery to address laceration in the back of his head prior to patient discharge to SNF.  He is alert and oriented x 4, not in significant pain, not in  distress.  Assessment/Plan: Principal Problem:   Anemia Active Problems:   Atrial fibrillation (HCC)   HTN (hypertension)   Laceration of head   Chronic myeloid leukemia (HCC)  Head laceration and hematoma post mechanical fall at home CT head no acute intracranial findings. CT neck no acute bony abnormality. Pain control with analgesics Consulted wound care specialist and contacted general surgery to address laceration in the back of his head prior to patient discharge to SNF.  He is alert and oriented x 4, not in significant pain, not in distress. 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 Peripheral smear showing many immature myeloid cells, no blasts seen, consistent with chronic myeloid leukemia. Seen by hematology oncology Dr. Salli Quarry for possible CML started, BCR/ABL pending, follow results. Currently on Gleevec and allopurinol, started on 09/03/2020- Continue Get serum uric acid level and LDH level, follow results Hematology recommended to discharge on both [place] and allopurinol as written, and follow up outpatient.   Acute blood loss anemia from head laceration and hematoma Symptomatic anemia Presented with hemoglobin of 9, dropped to 7.5 on 09/03/2020. Noted drop of hemoglobin 7.1 on 09/05/20 Transfuse 1U PRBC H&H post transfusion  Chronic normocytic anemia, suspect multifactorial secondary to suspected CML, rule out iron deficiency and GI occult source of anemia Iron studies and reticulocyte count Obtain FOBT, pending Continue to monitor H&H Transfuse hemoglobin less than 7 or hemoglobin less than 8 if symptomatic.  Weightbearing pain in right  lower extremity with moderate joint effusion Imaging no acute bony findings, Mild to moderate knee joint degenerative changes and moderate-sized joint effusion.  No acute bony findings on portable right knee x-ray. Analgesics as needed Ice  pack Get serum uric acid level.  Chronic A. fib not on [place] Rate controlled Continue home Toprol-XL 12.5 mg daily, reduced dose due to soft blood pressures. Continue to closely monitor vital signs.  Chronic systolic CHF Last 2D echo done on 06/19/2015 showed LVEF 40% with diffuse hypokinesis Strict I's and O's and daily weight Home p.o. Lasix currently on hold due to soft Bps Closely monitor volume status  Essential hypertension BP is currently soft Continue to monitor vital signs  BPH Monitor urine output Continue home Flomax.  Ambulatory dysfunction PT OT recommended SNF TOC assisting with SNF placement. Continue PT OT with assistance of for precautions.  Code Status: DNR  Family Communication: Updated his daughter at bedside on 09/05/20  Disposition Plan: SNF.   Consultants:  Hematology oncology  Procedures:  None  Antimicrobials:  None  DVT prophylaxis: SCDs  Status is: Inpatient.    Dispo:  Patient From: Home  Planned Disposition: [facility]  Expected discharge date: 09/06/20  Medically stable for discharge: No, ongoing management of symptomatic anemia with transfusion; ongoing management of suspected CML.    Objective: Vitals:   09/04/20 0650 09/04/20 1320 09/04/20 2039 09/05/20 0529  BP: (!) 114/57 113/65 117/62 (!) 118/57  Pulse: 93 80 75 89  Resp: _0 Temp: 98.7 F (37.1 C) 98.9 F (37.2 C) 98.5 F (36.9 C) 99.1 F (37.3 C)  TempSrc: Oral Oral Oral Oral  SpO2: 96% 97% 97% 99%  Weight:      Height:        Intake/Output Summary (Last 24 hours) at 09/05/2020 0817 Last data filed at 09/05/2020 0600 Gross per 24 hour  Intake 720 ml  Output 600 ml  Net 120 ml   Filed Weights   09/02/20 2032  Weight: 88.5 kg    Exam:  . General: 86 y.o. year-old male pleasant well-developed well-nourished in no acute distress.  Alert and oriented x4.  . Cardiovascular: Irregular rate and rhythm no rubs or  gallops. Marland Kitchen Respiratory: Clear to auscultation no wheezes or rales.  Abdomen: Soft nontender normal bowel sounds present. . Musculoskeletal: Moderate right knee effusion noted.  No erythema or significant warmth at the time of this visit.   Marland Kitchen Psychiatry: Mood is appropriate for condition and setting.       Data Reviewed: CBC: Recent Labs  Lab 09/02/20 1925 09/03/20 0519 09/04/20 0611 09/04/20 0836 09/05/20 0441  WBC 51.7* 41.9* 36.8*  --  37.5*  NEUTROABS 38.2*  --  28.3*  --  27.8*  HGB 9.0* 7.5* 7.1* 7.4* 7.1*  HCT 31.7* 26.5* 25.1* 26.1* 24.6*  MCV 88.1 88.6 89.0  --  86.9  PLT 591* 525* 465*  --  540*   Basic Metabolic Panel: Recent Labs  Lab 09/02/20 1925 09/03/20 0519 09/04/20 0611 09/05/20 0441  NA 140 142 138 137  K 4.8 3.5 4.2 4.1  CL 101 104 104 102  CO2 _1 GLUCOSE 122* 109* 115* 127*  BUN 26* _2 CREATININE 0.89 0.89 0.93 1.01  CALCIUM 8.7* 8.5* 8.3* 8.4*   GFR: Estimated Creatinine Clearance: 50.5 mL/min (by C-G formula based on SCr of 1.01 mg/dL). Liver Function Tests: No results for input(s): AST, ALT, ALKPHOS, BILITOT, PROT, ALBUMIN in  the last 168 hours. No results for input(s): LIPASE, AMYLASE in the last 168 hours. No results for input(s): AMMONIA in the last 168 hours. Coagulation Profile: Recent Labs  Lab 09/02/20 1925  INR 1.1   Cardiac Enzymes: No results for input(s): CKTOTAL, CKMB, CKMBINDEX, TROPONINI in the last 168 hours. BNP (last 3 results) No results for input(s): PROBNP in the last 8760 hours. HbA1C: No results for input(s): HGBA1C in the last 72 hours. CBG: No results for input(s): GLUCAP in the last 168 hours. Lipid Profile: No results for input(s): CHOL, HDL, LDLCALC, TRIG, CHOLHDL, LDLDIRECT in the last 72 hours. Thyroid Function Tests: No results for input(s): TSH, T4TOTAL, FREET4, T3FREE, THYROIDAB in the last 72 hours. Anemia Panel: Recent Labs    09/04/20 0611 09/04/20 0836  FERRITIN 192  --    TIBC 252  --   IRON 33*  --   RETICCTPCT  --  4.0*   Urine analysis:    Component Value Date/Time   COLORURINE AMBER (A) 10/26/2014 1727   APPEARANCEUR CLEAR 10/26/2014 1727   LABSPEC 1.020 10/26/2014 1727   PHURINE 5.5 10/26/2014 1727   GLUCOSEU NEGATIVE 10/26/2014 1727   HGBUR LARGE (A) 10/26/2014 1727   BILIRUBINUR NEGATIVE 10/26/2014 1727   KETONESUR NEGATIVE 10/26/2014 1727   PROTEINUR NEGATIVE 10/26/2014 1727   UROBILINOGEN 1.0 10/26/2014 1727   NITRITE NEGATIVE 10/26/2014 1727   LEUKOCYTESUR SMALL (A) 10/26/2014 1727   Sepsis Labs: _0 (procalcitonin:4,lacticidven:4)  ) Recent Results (from the past 240 hour(s))  Respiratory Panel by RT PCR (Flu A&B, Covid) - Nasopharyngeal Swab     Status: None   Collection Time: 09/02/20  9:47 PM   Specimen: Nasopharyngeal Swab  Result Value Ref Range Status   SARS Coronavirus 2 by RT PCR NEGATIVE NEGATIVE Final    Comment: (NOTE) SARS-CoV-2 target nucleic acids are NOT DETECTED.  The SARS-CoV-2 RNA is generally detectable in upper respiratoy specimens during the acute phase of infection. The lowest concentration of SARS-CoV-2 viral copies this assay can detect is 131 copies/mL. A negative result does not preclude SARS-Cov-2 infection and should not be used as the sole basis for treatment or other patient management decisions. A negative result may occur with  improper specimen collection/handling, submission of specimen other than nasopharyngeal swab, presence of viral mutation(s) within the areas targeted by this assay, and inadequate number of viral copies (<131 copies/mL). A negative result must be combined with clinical observations, patient history, and epidemiological information. The expected result is Negative.  Fact Sheet for Patients:  PinkCheek.be  Fact Sheet for Healthcare Providers:  GravelBags.it  This test is no t yet approved or cleared by  the [place] FDA and  has been authorized for detection and/or diagnosis of SARS-CoV-2 by FDA under an Emergency Use Authorization (EUA). This EUA will remain  in effect (meaning this test can be used) for the duration of the COVID-19 declaration under Section 564(b)(1) of the Act, 21 U.S.C. section 360bbb-3(b)(1), unless the authorization is terminated or revoked sooner.     Influenza A by PCR NEGATIVE NEGATIVE Final   Influenza B by PCR NEGATIVE NEGATIVE Final    Comment: (NOTE) The Xpert Xpress SARS-CoV-2/FLU/RSV assay is intended as an aid in  the diagnosis of influenza from Nasopharyngeal swab specimens and  should not be used as a sole basis for treatment. Nasal washings and  aspirates are unacceptable for Xpert Xpress SARS-CoV-2/FLU/RSV  testing.  Fact Sheet for Patients: PinkCheek.be  Fact Sheet for Healthcare Providers: GravelBags.it  This test is not yet approved or cleared by the [place] and  has been authorized for detection and/or diagnosis of SARS-CoV-2 by  FDA under an Emergency Use Authorization (EUA). This EUA will remain  in effect (meaning this test can be used) for the duration of the  Covid-19 declaration under Section 564(b)(1) of the Act, 21  U.S.C. section 360bbb-3(b)(1), unless the authorization is  terminated or revoked. Performed at [facility], [place] [address], [place], [place] [address]   Respiratory Panel by RT PCR (Flu A&B, Covid) - Nasopharyngeal Swab     Status: None   Collection Time: 09/04/20  3:25 PM   Specimen: Nasopharyngeal Swab  Result Value Ref Range Status   SARS Coronavirus 2 by RT PCR NEGATIVE NEGATIVE Final    Comment: (NOTE) SARS-CoV-2 target nucleic acids are NOT DETECTED.  The SARS-CoV-2 RNA is generally detectable in upper respiratoy specimens during the acute phase of infection. The lowest concentration of SARS-CoV-2 viral copies this  assay can detect is 131 copies/mL. A negative result does not preclude SARS-Cov-2 infection and should not be used as the sole basis for treatment or other patient management decisions. A negative result may occur with  improper specimen collection/handling, submission of specimen other than nasopharyngeal swab, presence of viral mutation(s) within the areas targeted by this assay, and inadequate number of viral copies (<131 copies/mL). A negative result must be combined with clinical observations, patient history, and epidemiological information. The expected result is Negative.  Fact Sheet for Patients:  PinkCheek.be  Fact Sheet for Healthcare Providers:  GravelBags.it  This test is no t yet approved or cleared by the [place] FDA and  has been authorized for detection and/or diagnosis of SARS-CoV-2 by FDA under an Emergency Use Authorization (EUA). This EUA will remain  in effect (meaning this test can be used) for the duration of the COVID-19 declaration under Section 564(b)(1) of the Act, 21 U.S.C. section 360bbb-3(b)(1), unless the authorization is terminated or revoked sooner.     Influenza A by PCR NEGATIVE NEGATIVE Final   Influenza B by PCR NEGATIVE NEGATIVE Final    Comment: (NOTE) The Xpert Xpress SARS-CoV-2/FLU/RSV assay is intended as an aid in  the diagnosis of influenza from Nasopharyngeal swab specimens and  should not be used as a sole basis for treatment. Nasal washings and  aspirates are unacceptable for Xpert Xpress SARS-CoV-2/FLU/RSV  testing.  Fact Sheet for Patients: PinkCheek.be  Fact Sheet for Healthcare Providers: GravelBags.it  This test is not yet approved or cleared by the [place] FDA and  has been authorized for detection and/or diagnosis of SARS-CoV-2 by  FDA under an Emergency Use Authorization (EUA). This EUA will  remain  in effect (meaning this test can be used) for the duration of the  Covid-19 declaration under Section 564(b)(1) of the Act, 21  U.S.C. section 360bbb-3(b)(1), unless the authorization is  terminated or revoked. Performed at [facility], [place] [address] [place], [place] [address]       Studies: No results found.  Scheduled Meds: . allopurinol  150 mg Oral Daily  . imatinib  200 mg Oral Q breakfast  . influenza vaccine adjuvanted  0.5 mL Intramuscular Tomorrow-1000  . metoprolol succinate  12.5 mg Oral Daily  . tamsulosin  0.4 mg Oral QHS    Continuous Infusions:   LOS: 2 days     Kayleen Memos, MD [facility] Pager [phone]  If 7PM-7AM, please contact night-coverage  [email or web address] Password [facility] 09/05/2020, 8:17 AM

## 2020-09-05 NOTE — Consult Note (Signed)
[facility] Nurse Consult Note: Reason for Consult: Patient with dried blood and matted hair at the occiput from fall on Saturday. 3 Staples are reported to have been placed in the ED.  Wound type: Trauma Pressure Injury POA: N/A Measurement: 15cm x 18cm area of matted hair and dried blood at occiput. 2cm x 0.4cm area on the right anterior LE (laceration) with dried blood obscuring defect base. Wound bed: As described above Drainage (amount, consistency, odor) No active bleeding, no drainage Periwound: intact with some ecchymosis surrounding Dressing procedure/placement/frequency: Used hydrogen peroxide to loosen and remove 80% of dried blood and matted hair over 1 hour. Guidance provided for wound care using continuous soaks with peroxide until dried blood and matted hair is removed. May use comb to remove residual. When staple line is visible, apply a thin layer of bacitracin ointment twice daily.  Xeroform gauze is to be applied to the leg wound until healed.  [facility] nursing team will not follow, but will remain available to this patient, the nursing and medical teams.  Please re-consult if needed. Thanks, Maudie Flakes, MSN, RN, [place], Arther Abbott  Pager# [phone]

## 2020-09-05 NOTE — Progress Notes (Signed)
CORBEN AUZENNE   DOB:06-29-1924   KG#:[id number]   FWY#:[id number]  Subjective:  William Ibarra is comfortable in bed, very alert and well-oriented, recognized me (by name!) after only one prior visit, doing word puzzles. He tells me his right knee hurts when he tries to stand and his right leg is weak. He is having no SE from the imatinib that he is aware of. No family in room   Objective: elderly white man examined in bed Vitals:   09/04/20 2039 09/05/20 0529  BP: 117/62 (!) 118/57  Pulse: 75 89  Resp: 16 16  Temp: 98.5 F (36.9 C) 99.1 F (37.3 C)  SpO2: 97% 99%    Body mass index is 25.73 kg/m.  Intake/Output Summary (Last 24 hours) at 09/05/2020 0738 Last data filed at 09/05/2020 0600 Gross per 24 hour  Intake 720 ml  Output 600 ml  Net 120 ml     CBG (last 3)  No results for input(s): GLUCAP in the last 72 hours.   Labs:  Lab Results  Component Value Date   WBC 37.5 (H) 09/05/2020   HGB 7.1 (L) 09/05/2020   HCT 24.6 (L) 09/05/2020   MCV 86.9 09/05/2020   PLT 442 (H) 09/05/2020   NEUTROABS 27.8 (H) 09/05/2020    _0 @  Urine Studies No results for input(s): UHGB, CRYS in the last 72 hours.  Invalid input(s): UACOL, UAPR, USPG, UPH, UTP, UGL, UKET, UBIL, UNIT, UROB, [place], UEPI, UWBC, [place], [place], [place], [place], [place]  Basic Metabolic Panel: Recent Labs  Lab 09/02/20 1925 09/02/20 1925 09/03/20 0519 09/03/20 0519 09/04/20 0611 09/05/20 0441  NA 140  --  142  --  138 137  K 4.8   < > 3.5   < > 4.2 4.1  CL 101  --  104  --  104 102  CO2 28  --  26  --  25 26  GLUCOSE 122*  --  109*  --  115* 127*  BUN 26*  --  21  --  18 19  CREATININE 0.89  --  0.89  --  0.93 1.01  CALCIUM 8.7*  --  8.5*  --  8.3* 8.4*   < > = values in this interval not displayed.   GFR Estimated Creatinine Clearance: 50.5 mL/min (by C-G formula based on SCr of 1.01 mg/dL). Liver Function Tests: No results for input(s): AST, ALT, ALKPHOS, BILITOT, PROT, ALBUMIN in the last 168  hours. No results for input(s): LIPASE, AMYLASE in the last 168 hours. No results for input(s): AMMONIA in the last 168 hours. Coagulation profile Recent Labs  Lab 09/02/20 1925  INR 1.1    CBC: Recent Labs  Lab 09/02/20 1925 09/03/20 0519 09/04/20 0611 09/04/20 0836 09/05/20 0441  WBC 51.7* 41.9* 36.8*  --  37.5*  NEUTROABS 38.2*  --  28.3*  --  27.8*  HGB 9.0* 7.5* 7.1* 7.4* 7.1*  HCT 31.7* 26.5* 25.1* 26.1* 24.6*  MCV 88.1 88.6 89.0  --  86.9  PLT 591* 525* 465*  --  442*   Cardiac Enzymes: No results for input(s): CKTOTAL, CKMB, CKMBINDEX, TROPONINI in the last 168 hours. BNP: Invalid input(s): POCBNP CBG: No results for input(s): GLUCAP in the last 168 hours. D-Dimer No results for input(s): DDIMER in the last 72 hours. Hgb A1c No results for input(s): HGBA1C in the last 72 hours. Lipid Profile No results for input(s): CHOL, HDL, LDLCALC, TRIG, CHOLHDL, LDLDIRECT in the last 72 hours. Thyroid function studies No results  for input(s): TSH, T4TOTAL, T3FREE, THYROIDAB in the last 72 hours.  Invalid input(s): FREET3 Anemia work up [employer]    09/04/20 0611 09/04/20 0836  FERRITIN 192  --   TIBC 252  --   IRON 33*  --   RETICCTPCT  --  4.0*   Microbiology Recent Results (from the past 240 hour(s))  Respiratory Panel by RT PCR (Flu A&B, Covid) - Nasopharyngeal Swab     Status: None   Collection Time: 09/02/20  9:47 PM   Specimen: Nasopharyngeal Swab  Result Value Ref Range Status   SARS Coronavirus 2 by RT PCR NEGATIVE NEGATIVE Final    Comment: (NOTE) SARS-CoV-2 target nucleic acids are NOT DETECTED.  The SARS-CoV-2 RNA is generally detectable in upper respiratoy specimens during the acute phase of infection. The lowest concentration of SARS-CoV-2 viral copies this assay can detect is 131 copies/mL. A negative result does not preclude SARS-Cov-2 infection and should not be used as the sole basis for treatment or other patient management decisions.  A negative result may occur with  improper specimen collection/handling, submission of specimen other than nasopharyngeal swab, presence of viral mutation(s) within the areas targeted by this assay, and inadequate number of viral copies (<131 copies/mL). A negative result must be combined with clinical observations, patient history, and epidemiological information. The expected result is Negative.  Fact Sheet for Patients:  PinkCheek.be  Fact Sheet for Healthcare Providers:  GravelBags.it  This test is no t yet approved or cleared by the [place] FDA and  has been authorized for detection and/or diagnosis of SARS-CoV-2 by FDA under an Emergency Use Authorization (EUA). This EUA will remain  in effect (meaning this test can be used) for the duration of the COVID-19 declaration under Section 564(b)(1) of the Act, 21 U.S.C. section 360bbb-3(b)(1), unless the authorization is terminated or revoked sooner.     Influenza A by PCR NEGATIVE NEGATIVE Final   Influenza B by PCR NEGATIVE NEGATIVE Final    Comment: (NOTE) The Xpert Xpress SARS-CoV-2/FLU/RSV assay is intended as an aid in  the diagnosis of influenza from Nasopharyngeal swab specimens and  should not be used as a sole basis for treatment. Nasal washings and  aspirates are unacceptable for Xpert Xpress SARS-CoV-2/FLU/RSV  testing.  Fact Sheet for Patients: PinkCheek.be  Fact Sheet for Healthcare Providers: GravelBags.it  This test is not yet approved or cleared by the [place] FDA and  has been authorized for detection and/or diagnosis of SARS-CoV-2 by  FDA under an Emergency Use Authorization (EUA). This EUA will remain  in effect (meaning this test can be used) for the duration of the  Covid-19 declaration under Section 564(b)(1) of the Act, 21  U.S.C. section 360bbb-3(b)(1), unless the  authorization is  terminated or revoked. Performed at [facility], [place] [address], [place], [place] [address]   Respiratory Panel by RT PCR (Flu A&B, Covid) - Nasopharyngeal Swab     Status: None   Collection Time: 09/04/20  3:25 PM   Specimen: Nasopharyngeal Swab  Result Value Ref Range Status   SARS Coronavirus 2 by RT PCR NEGATIVE NEGATIVE Final    Comment: (NOTE) SARS-CoV-2 target nucleic acids are NOT DETECTED.  The SARS-CoV-2 RNA is generally detectable in upper respiratoy specimens during the acute phase of infection. The lowest concentration of SARS-CoV-2 viral copies this assay can detect is 131 copies/mL. A negative result does not preclude SARS-Cov-2 infection and should not be used as the sole basis for  treatment or other patient management decisions. A negative result may occur with  improper specimen collection/handling, submission of specimen other than nasopharyngeal swab, presence of viral mutation(s) within the areas targeted by this assay, and inadequate number of viral copies (<131 copies/mL). A negative result must be combined with clinical observations, patient history, and epidemiological information. The expected result is Negative.  Fact Sheet for Patients:  PinkCheek.be  Fact Sheet for Healthcare Providers:  GravelBags.it  This test is no t yet approved or cleared by the [place] FDA and  has been authorized for detection and/or diagnosis of SARS-CoV-2 by FDA under an Emergency Use Authorization (EUA). This EUA will remain  in effect (meaning this test can be used) for the duration of the COVID-19 declaration under Section 564(b)(1) of the Act, 21 U.S.C. section 360bbb-3(b)(1), unless the authorization is terminated or revoked sooner.     Influenza A by PCR NEGATIVE NEGATIVE Final   Influenza B by PCR NEGATIVE NEGATIVE Final    Comment: (NOTE) The Xpert Xpress  SARS-CoV-2/FLU/RSV assay is intended as an aid in  the diagnosis of influenza from Nasopharyngeal swab specimens and  should not be used as a sole basis for treatment. Nasal washings and  aspirates are unacceptable for Xpert Xpress SARS-CoV-2/FLU/RSV  testing.  Fact Sheet for Patients: PinkCheek.be  Fact Sheet for Healthcare Providers: GravelBags.it  This test is not yet approved or cleared by the [place] FDA and  has been authorized for detection and/or diagnosis of SARS-CoV-2 by  FDA under an Emergency Use Authorization (EUA). This EUA will remain  in effect (meaning this test can be used) for the duration of the  Covid-19 declaration under Section 564(b)(1) of the Act, 21  U.S.C. section 360bbb-3(b)(1), unless the authorization is  terminated or revoked. Performed at [facility], [place] [address] [place], [place] [address]       Studies:  DG Knee Right Port  Result Date: 09/03/2020 CLINICAL DATA:  [place].  Right knee pain. EXAM: PORTABLE RIGHT KNEE - 1-2 VIEW COMPARISON:  None. FINDINGS: Mild to moderate knee joint degenerative changes but no acute fracture, bone lesion or osteochondral abnormality. There is a moderate-sized suprapatellar knee joint effusion noted. IMPRESSION: 1. Mild to moderate knee joint degenerative changes and moderate-sized joint effusion. 2. No acute bony findings. Electronically Signed   By: Marijo Sanes M.D.   On: 09/03/2020 13:13   DG Tibia/Fibula Right Port  Result Date: 09/03/2020 CLINICAL DATA:  Tripped yesterday and injured leg. EXAM: PORTABLE RIGHT TIBIA AND FIBULA - 2 VIEW COMPARISON:  None. FINDINGS: The knee and ankle joints are grossly maintained. Moderate degenerative changes. No acute fracture of the tibia or fibula is identified. IMPRESSION: No acute bony findings. Electronically Signed   By: Marijo Sanes M.D.   On: 09/03/2020 13:12   DG HIP PORT UNILAT WITH  PELVIS 1V RIGHT  Result Date: 09/03/2020 CLINICAL DATA:  [place] yesterday.  Right hip pain. EXAM: DG HIP (WITH OR WITHOUT PELVIS) 1V PORT RIGHT COMPARISON:  None. FINDINGS: Both hips are normally located. No acute hip fracture is identified. The pubic symphysis and SI joints are intact. No definite pelvic fractures. IMPRESSION: No acute bony findings. Electronically Signed   By: Marijo Sanes M.D.   On: 09/03/2020 13:14    Assessment: 86 y.o. [place] resident admitted 09/02/2020 following a fall, found to have an elevated white cell count and platelet count and significant anemia, review of blood film most consistent with chronic myeloid  leukemia  (1) BCR.ABL pending  (2) patient opted against bone marrow biopsy  (3) Gleevec started 09/03/2020 at half-dose (200 mg/d)  Plan:  Mr Trautman is tolerating gleevec/imatinib with no side effects that he is aware of. He is also on allopurinol and I have written for a uric acid level in AM. BCR.ABL probe results still pending  At discharge please make sure he continues on both imatinib and allopurinol as written. I will arrange for f/u in about a month to assess response  Please le me know if I can be of further help   Chauncey Cruel, MD 09/05/2020  7:38 AM Medical Oncology and Hematology [facility] [address] [place], [place] [address] Tel. [phone]    Fax. [phone]

## 2020-09-06 ENCOUNTER — Encounter: Payer: Self-pay | Admitting: Internal Medicine

## 2020-09-06 DIAGNOSIS — W19XXXA Unspecified fall, initial encounter: Secondary | ICD-10-CM | POA: Diagnosis not present

## 2020-09-06 DIAGNOSIS — M2550 Pain in unspecified joint: Secondary | ICD-10-CM | POA: Diagnosis not present

## 2020-09-06 DIAGNOSIS — R41841 Cognitive communication deficit: Secondary | ICD-10-CM | POA: Diagnosis not present

## 2020-09-06 DIAGNOSIS — R410 Disorientation, unspecified: Secondary | ICD-10-CM | POA: Diagnosis not present

## 2020-09-06 DIAGNOSIS — S0191XD Laceration without foreign body of unspecified part of head, subsequent encounter: Secondary | ICD-10-CM | POA: Diagnosis not present

## 2020-09-06 DIAGNOSIS — R600 Localized edema: Secondary | ICD-10-CM | POA: Diagnosis not present

## 2020-09-06 DIAGNOSIS — M79604 Pain in right leg: Secondary | ICD-10-CM | POA: Diagnosis not present

## 2020-09-06 DIAGNOSIS — R2689 Other abnormalities of gait and mobility: Secondary | ICD-10-CM | POA: Diagnosis not present

## 2020-09-06 DIAGNOSIS — S0191XA Laceration without foreign body of unspecified part of head, initial encounter: Secondary | ICD-10-CM | POA: Diagnosis not present

## 2020-09-06 DIAGNOSIS — Z7401 Bed confinement status: Secondary | ICD-10-CM | POA: Diagnosis not present

## 2020-09-06 DIAGNOSIS — K5901 Slow transit constipation: Secondary | ICD-10-CM | POA: Diagnosis not present

## 2020-09-06 DIAGNOSIS — F0390 Unspecified dementia without behavioral disturbance: Secondary | ICD-10-CM | POA: Diagnosis not present

## 2020-09-06 DIAGNOSIS — M6281 Muscle weakness (generalized): Secondary | ICD-10-CM | POA: Diagnosis not present

## 2020-09-06 DIAGNOSIS — K5900 Constipation, unspecified: Secondary | ICD-10-CM | POA: Diagnosis not present

## 2020-09-06 DIAGNOSIS — S51011A Laceration without foreign body of right elbow, initial encounter: Secondary | ICD-10-CM | POA: Diagnosis not present

## 2020-09-06 DIAGNOSIS — Z9181 History of falling: Secondary | ICD-10-CM | POA: Diagnosis not present

## 2020-09-06 DIAGNOSIS — I959 Hypotension, unspecified: Secondary | ICD-10-CM | POA: Diagnosis not present

## 2020-09-06 DIAGNOSIS — E790 Hyperuricemia without signs of inflammatory arthritis and tophaceous disease: Secondary | ICD-10-CM | POA: Diagnosis not present

## 2020-09-06 DIAGNOSIS — I872 Venous insufficiency (chronic) (peripheral): Secondary | ICD-10-CM | POA: Diagnosis not present

## 2020-09-06 DIAGNOSIS — I4891 Unspecified atrial fibrillation: Secondary | ICD-10-CM | POA: Diagnosis not present

## 2020-09-06 DIAGNOSIS — D649 Anemia, unspecified: Secondary | ICD-10-CM | POA: Diagnosis not present

## 2020-09-06 DIAGNOSIS — R2681 Unsteadiness on feet: Secondary | ICD-10-CM | POA: Diagnosis not present

## 2020-09-06 DIAGNOSIS — R296 Repeated falls: Secondary | ICD-10-CM | POA: Diagnosis not present

## 2020-09-06 DIAGNOSIS — N401 Enlarged prostate with lower urinary tract symptoms: Secondary | ICD-10-CM | POA: Diagnosis not present

## 2020-09-06 DIAGNOSIS — R531 Weakness: Secondary | ICD-10-CM | POA: Diagnosis not present

## 2020-09-06 DIAGNOSIS — E119 Type 2 diabetes mellitus without complications: Secondary | ICD-10-CM | POA: Diagnosis not present

## 2020-09-06 DIAGNOSIS — I10 Essential (primary) hypertension: Secondary | ICD-10-CM | POA: Diagnosis not present

## 2020-09-06 DIAGNOSIS — C9210 Chronic myeloid leukemia, BCR/ABL-positive, not having achieved remission: Secondary | ICD-10-CM | POA: Diagnosis not present

## 2020-09-06 DIAGNOSIS — M1A40X0 Other secondary chronic gout, unspecified site, without tophus (tophi): Secondary | ICD-10-CM | POA: Diagnosis not present

## 2020-09-06 DIAGNOSIS — R350 Frequency of micturition: Secondary | ICD-10-CM | POA: Diagnosis not present

## 2020-09-06 LAB — BASIC METABOLIC PANEL
Anion gap: 9 (ref 5–15)
BUN: 16 mg/dL (ref 8–23)
CO2: 25 mmol/L (ref 22–32)
Calcium: 8.1 mg/dL — ABNORMAL LOW (ref 8.9–10.3)
Chloride: 101 mmol/L (ref 98–111)
Creatinine, Ser: 0.94 mg/dL (ref 0.61–1.24)
GFR, Estimated: >60 mL/min (ref >60)
Glucose, Bld: 187 mg/dL — ABNORMAL HIGH (ref 70–99)
Potassium: 3.7 mmol/L (ref 3.5–5.1)
Sodium: 135 mmol/L (ref 135–145)

## 2020-09-06 LAB — TYPE AND SCREEN
ABO/RH(D): A POS
Antibody Screen: NEGATIVE
Unit division: 0

## 2020-09-06 LAB — CBC WITH DIFFERENTIAL/PLATELET
Abs Immature Granulocytes: 3.3 10*3/uL — ABNORMAL HIGH (ref 0.00–0.07)
Band Neutrophils: 3 %
Basophils Absolute: 0.8 10*3/uL — ABNORMAL HIGH (ref 0.0–0.1)
Basophils Relative: 2 %
Eosinophils Absolute: 0.4 10*3/uL (ref 0.0–0.5)
Eosinophils Relative: 1 %
HCT: 28.1 % — ABNORMAL LOW (ref 39.0–52.0)
Hemoglobin: 8.2 g/dL — ABNORMAL LOW (ref 13.0–17.0)
Lymphocytes Relative: 4 %
Lymphs Abs: 1.6 10*3/uL (ref 0.7–4.0)
MCH: 25.7 pg — ABNORMAL LOW (ref 26.0–34.0)
MCHC: 29.2 g/dL — ABNORMAL LOW (ref 30.0–36.0)
MCV: 88.1 fL (ref 80.0–100.0)
Metamyelocytes Relative: 3 %
Monocytes Absolute: 0.8 10*3/uL (ref 0.1–1.0)
Monocytes Relative: 2 %
Myelocytes: 4 %
Neutro Abs: 33.8 10*3/uL — ABNORMAL HIGH (ref 1.7–7.7)
Neutrophils Relative %: 80 %
Platelets: 433 10*3/uL — ABNORMAL HIGH (ref 150–400)
Promyelocytes Relative: 1 %
RBC: 3.19 MIL/uL — ABNORMAL LOW (ref 4.22–5.81)
RDW: 24.6 % — ABNORMAL HIGH (ref 11.5–15.5)
WBC: 40.7 10*3/uL — ABNORMAL HIGH (ref 4.0–10.5)
nRBC: 0.1 % (ref 0.0–0.2)

## 2020-09-06 LAB — BPAM RBC
Blood Product Expiration Date: 202111262359
ISSUE DATE / TIME: 202111021411
Unit Type and Rh: 6200

## 2020-09-06 MED ORDER — IMATINIB MESYLATE 100 MG PO TABS: 200.0000 mg | ORAL_TABLET | Freq: Every day | ORAL | 1 refills | Status: DC

## 2020-09-06 MED ORDER — ALLOPURINOL 300 MG PO TABS: 150.0000 mg | ORAL_TABLET | Freq: Every day | ORAL | 1 refills | Status: AC

## 2020-09-06 MED ORDER — FUROSEMIDE 20 MG PO TABS
ORAL_TABLET | ORAL | 1 refills | Status: DC
Start: 2020-09-06 — End: 2020-09-14

## 2020-09-06 MED ORDER — METOPROLOL SUCCINATE ER 25 MG PO TB24: 12.5000 mg | ORAL_TABLET | Freq: Every day | ORAL | 2 refills | Status: AC

## 2020-09-06 MED ORDER — POTASSIUM CHLORIDE ER 10 MEQ PO CPCR
ORAL_CAPSULE | ORAL | 2 refills | Status: DC
Start: 2020-09-06 — End: 2020-09-14

## 2020-09-06 MED ORDER — BACITRACIN ZINC 500 UNIT/GM EX OINT: TOPICAL_OINTMENT | Freq: Two times a day (BID) | CUTANEOUS | 0 refills | Status: AC

## 2020-09-06 MED ORDER — ONDANSETRON HCL 4 MG PO TABS: 4.0000 mg | ORAL_TABLET | Freq: Four times a day (QID) | ORAL | 0 refills | Status: DC | PRN

## 2020-09-06 NOTE — Progress Notes (Signed)
Physical Therapy Treatment Patient Details Name: William Ibarra MRN: [id number] DOB: 02-08-1924 Today's Date: 09/06/2020    History of Present Illness William Ibarra is a 86 y.o. male hx of afib not on blood thinners, diabetes, hypertension, CLL here presenting with fall. Patient states that he may have slipped and fell and hit his head. Patient was noted to have a hematoma in the back of his head. He also has some skin tears in his leg. He was noted to have profuse bleeding. Patient is not currently on blood thinners. Pt admitted with Head laceration and hematoma post mechanical fall at home    PT Comments    Pt required some encouragement today however assisted to sitting EOB and brushed his teeth. Pt attempted standing however unable to completely stand erect with 3 attempts.  Pt continues to report right knee pain severely limiting mobility.   Follow Up Recommendations  SNF     Equipment Recommendations  None recommended by PT    Recommendations for Other Services       Precautions / Restrictions Precautions Precautions: Fall    Mobility  Bed Mobility Overal bed mobility: Needs Assistance Bed Mobility: Supine to Sit;Sit to Supine     Supine to sit: Max assist;+2 for physical assistance Sit to supine: Mod assist;+2 for physical assistance   General bed mobility comments: assist for trunk and LEs in both directions, incr time, cues to self assist however pt not initiating  Transfers Overall transfer level: Needs assistance Equipment used: Rolling walker (2 wheeled) Transfers: Sit to/from Stand Sit to Stand: Total assist;+2 physical assistance;From elevated surface         General transfer comment: verbal cues for technique, pt cued to use UEs and L LE to assist due to pain in R LE however pt reports right knee too painful, unable to assist to full standing with 3 attempts  Ambulation/Gait                 Stairs             Wheelchair  Mobility    Modified Rankin (Stroke Patients Only)       Balance Overall balance assessment: History of Falls;Needs assistance Sitting-balance support: No upper extremity supported Sitting balance-Leahy Scale: Good Sitting balance - Comments: pt brushed his teeth at EOB                                    Cognition Arousal/Alertness: Awake/alert Behavior During Therapy: WFL for tasks assessed/performed Overall Cognitive Status: Within Functional Limits for tasks assessed                                        Exercises      General Comments        Pertinent Vitals/Pain Pain Assessment: Faces Faces Pain Scale: Hurts whole lot Pain Location: R knee Pain Descriptors / Indicators: Grimacing;Guarding;Sore Pain Intervention(s): Repositioned;Monitored during session    Home Living                      Prior Function            PT Goals (current goals can now be found in the care plan section) Progress towards PT goals: Progressing toward goals    Frequency    Min  2X/week      PT Plan Current plan remains appropriate    Co-evaluation              AM-PAC PT "6 Clicks" Mobility   Outcome Measure  Help needed turning from your back to your side while in a flat bed without using bedrails?: A Lot Help needed moving from lying on your back to sitting on the side of a flat bed without using bedrails?: A Lot Help needed moving to and from a bed to a chair (including a wheelchair)?: A Lot Help needed standing up from a chair using your arms (e.g., wheelchair or bedside chair)?: A Lot Help needed to walk in hospital room?: Total Help needed climbing 3-5 steps with a railing? : Total 6 Click Score: 10    End of Session Equipment Utilized During Treatment: Gait belt Activity Tolerance: Patient limited by pain Patient left: in bed;with call bell/phone within reach;with bed alarm set;with family/visitor present   PT Visit  Diagnosis: Other abnormalities of gait and mobility (R26.89);Pain Pain - Right/Left: Right Pain - part of body: Knee     Time: 0349-1791 PT Time Calculation (min) (ACUTE ONLY): 23 min  Charges:  $Therapeutic Activity: 23-37 mins                    Jannette Spanner PT, DPT Acute Rehabilitation Services Pager: [phone] Office: [phone]  York Ram E 09/06/2020, 12:23 PM

## 2020-09-06 NOTE — Discharge Summary (Signed)
Physician Discharge Summary  William Ibarra TLX:[id number] DOB: 01/10/1924 DOA: 09/02/2020  PCP: Shon Baton, MD  Admit date: 09/02/2020 Discharge date: 09/06/2020  Admitted From: [facility]position: [facility] Recommendations for Outpatient Follow-up:  1. Follow up with PCP in 1-2 weeks 2. Please obtain BMP/CBC/uric acid in one week 3. Medication change Lasix dose has been decreased to 20 mg Monday Wednesday and Friday instead of daily as his blood pressure was soft.  Please evaluate on a daily basis whether Lasix dose need to be increased or decreased.  Patient has systolic heart failure.  I have also decreased the dose of potassium to 10 meq Monday Wednesday and Friday  Home Health: Skilled nursing facility Equipment/Devices: None  Discharge Condition: Stable CODE STATUS DNR  diet recommendation: Cardiac Brief/Interim Summary:86 y.o.malewith medical history significant ofchronic A.Fib not on OAC, BPH, HTN, chronically elevated WBC and platelet count who presented from home after a mechanical fall.  He slipped and fell at home and hit his head.  He lives alone.  He had profuse bleeding after his fall and hematoma to the back of his head.  Per chart review he has a history of large amount of bleeding with any minor trauma.  He was in his usual state of health prior to that.  Pressure bandage was applied by EDP.  Hematology consulted for elevated WBC and platelet count.  TRH asked to admit.    Presented with WBC 51k, HGB 9K, platelet count 591K.  Seen by hematology/oncology, Dr. Jana Hakim, initiated work-up for suspected CML and started [place].  Review of peripheral smear most consistent with chronic myeloid leukemia per oncology.  Patient opted against bone marrow biopsy, BCR-ABL pending.  Was started on Gleevec at half dose 200 mg daily on 09/03/2020, also started on allopurinol 150 mg daily on 09/03/2020.  Hospital course complicated by right knee pain, imaging showed  moderate joint effusion with no acute bony findings.  09/05/20: Drop in hemoglobin this morning down to 7.1.  FOBT ordered to rule out GI etiology, pending.  Will transfuse 1 unit of PRBC to maintain hemoglobin above 8 for symptomatic anemia. Right knee pain, started on allopurinol on 09/03/2020.  Tests ordered for tomorrow per hematology will be obtained today, follow results.    Discharge Diagnoses:  Principal Problem:   Anemia Active Problems:   Atrial fibrillation (HCC)   HTN (hypertension)   Laceration of head   Chronic myeloid leukemia (HCC)   #1 suspected CML-patient presented with white count of 50 1K platelet count of 590 1K peripheral smear showing immature myeloid cells consistent with CML with no blasts seen.  Seen by Dr. Jana Hakim started patient on [place] and allopurinol on 09/03/2020.  Patient tolerated Levaquin without any side effects.  Uric acid on the day of discharge was 8.4.  Continue Gleevec and allopurinol daily.  Follow-up with Dr. Jana Hakim is in 1 month.  #2 status post mechanical fall with laceration on the scalp seen by general surgery had 3 staples placed.  Remove staples in about 10 days from today that is around Sep 16 2020.  #3 symptomatic anemia patient received 1 unit of packed RBC hemoglobin 8.3 on the day of discharge.  #4 right knee moderate joint effusion x-ray shows no acute findings.  Degenerative changes noted.  Ice pack as needed.  Analgesics as needed.  #5 chronic A. fib not on anticoagulation rate controlled with Toprol.  #6 history of systolic heart failure ejection fraction 40% in 2016 echo.  He was  on Lasix 20 mg daily.  This dose was decreased to 20 mg Monday Wednesday and Friday.  Please monitor volume status on a daily basis and adjust the dose of Lasix.  His Lasix dose was decreased during the hospital stay due to soft blood pressure.  #7 history of essential hypertension he is on multiple medications prior to admission.  At the time of  discharge he is on a lower dose of metoprolol and Lasix Monday Wednesday and Friday.  All other medications have been stopped due to soft blood pressure.  In addition he is on Flomax which can lower the blood pressure.  #8 BPH on Flomax.  #9 goals of care patient is DO NOT RESUSCITATE.  Updated daughter at the bedside.  Patient seen by physical therapy recommended SNF.  Continue PT OT with assistance.  Estimated body mass index is 25.73 kg/m as calculated from the following:   Height as of this encounter: _0  (1.854 m).   Weight as of this encounter: 88.5 kg.  Discharge Instructions   Allergies as of 09/06/2020   No Known Allergies     Medication List    STOP taking these medications   losartan-hydrochlorothiazide 100-25 MG tablet Commonly known as: HYZAAR     TAKE these medications   allopurinol 300 MG tablet Commonly known as: ZYLOPRIM Take 0.5 tablets (150 mg total) by mouth daily. Start taking on: September 07, 2020   bacitracin ointment Apply topically 2 (two) times daily.   furosemide 20 MG tablet Commonly known as: LASIX Lasix 20 mg Monday Wednesday and Friday What changed:   how much to take  how to take this  when to take this  additional instructions   imatinib 100 MG tablet Commonly known as: GLEEVEC Take 2 tablets (200 mg total) by mouth daily with breakfast. Take with meals and large glass of water.Caution:Chemotherapy Start taking on: September 07, 2020   metoprolol succinate 25 MG 24 hr tablet Commonly known as: TOPROL-XL Take 0.5 tablets (12.5 mg total) by mouth daily. Start taking on: September 07, 2020 What changed:   medication strength  how much to take  additional instructions   ondansetron 4 MG tablet Commonly known as: ZOFRAN Take 1 tablet (4 mg total) by mouth every 6 (six) hours as needed for nausea.   potassium chloride 10 MEQ CR capsule Commonly known as: MICRO-K Potassium 10 mEq Monday Wednesday and Friday with Lasix  only What changed:   how much to take  how to take this  when to take this  additional instructions   tamsulosin 0.4 MG Caps capsule Commonly known as: FLOMAX Take 1 capsule (0.4 mg total) by mouth at bedtime.            Durable Medical Equipment  (From admission, onward)         Start     Ordered   09/04/20 0700  For home use only DME standard manual wheelchair with seat cushion  Once       Comments: Patient suffers from ambulatory dysfunction which impairs their ability to perform daily activities like walking in the home.  A walker will not resolve issue with performing activities of daily living. A wheelchair will allow patient to safely perform daily activities. Patient can safely propel the wheelchair in the home or has a caregiver who can provide assistance. Length of need 6 months. Accessories: elevating leg rests, wheel locks, extensions and anti-tippers.   09/04/20 0510  Contact information for follow-up providers    Shon Baton, MD Follow up.   Specialty: Internal Medicine Contact information: [address] [place] [address] [phone]        Magrinat, Virgie Dad, MD Follow up.   Specialty: Oncology Contact information: [place] [address] [phone]            Contact information for after-discharge care    Destination    [facility] .   Service: Skilled Nursing Contact information: [address] [place] [phone]                 No Known Allergies  Consultations: Oncology Dr. Jana Hakim  Procedures/Studies: DG Chest 1 View  Result Date: 09/02/2020 CLINICAL DATA:  Status post fall. EXAM: CHEST  1 VIEW COMPARISON:  June 08, 2015 FINDINGS: Cardiomediastinal silhouette is normal. Mediastinal contours appear intact. Mild calcific atherosclerotic disease of the aorta. There is no evidence of focal airspace consolidation, pleural  effusion or pneumothorax. Osseous structures are without acute abnormality. Soft tissues are grossly normal. IMPRESSION: No active disease. Electronically Signed   By: Fidela Salisbury M.D.   On: 09/02/2020 19:53   DG Pelvis 1-2 Views  Result Date: 09/02/2020 CLINICAL DATA:  Fall EXAM: PELVIS - 1-2 VIEW COMPARISON:  None. FINDINGS: There is no evidence of pelvic fracture or diastasis. No pelvic bone lesions are seen. IMPRESSION: Negative. Electronically Signed   By: Rolm Baptise M.D.   On: 09/02/2020 19:55   DG Tibia/Fibula Left  Result Date: 09/02/2020 CLINICAL DATA:  Fall EXAM: LEFT TIBIA AND FIBULA - 2 VIEW COMPARISON:  None. FINDINGS: No acute bony abnormality. Specifically, no fracture, subluxation, or dislocation. No radiopaque foreign body. IMPRESSION: No acute bony abnormality. Electronically Signed   By: Rolm Baptise M.D.   On: 09/02/2020 19:57   CT Head Wo Contrast  Result Date: 09/02/2020 CLINICAL DATA:  Fall, hit head EXAM: CT HEAD WITHOUT CONTRAST TECHNIQUE: Contiguous axial images were obtained from the base of the skull through the vertex without intravenous contrast. COMPARISON:  None. FINDINGS: Brain: There is atrophy and chronic small vessel disease changes. No acute intracranial abnormality. Specifically, no hemorrhage, hydrocephalus, mass lesion, acute infarction, or significant intracranial injury. Vascular: No hyperdense vessel or unexpected calcification. Skull: No acute calvarial abnormality. Sinuses/Orbits: Visualized paranasal sinuses and mastoids clear. Orbital soft tissues unremarkable. Other: Soft tissue swelling posterior scalp. IMPRESSION: Atrophy, chronic microvascular disease. No acute intracranial abnormality. Electronically Signed   By: Rolm Baptise M.D.   On: 09/02/2020 20:28   CT Cervical Spine Wo Contrast  Result Date: 09/02/2020 CLINICAL DATA:  Fall, hit back of head. EXAM: CT CERVICAL SPINE WITHOUT CONTRAST TECHNIQUE: Multidetector CT imaging of the  cervical spine was performed without intravenous contrast. Multiplanar CT image reconstructions were also generated. COMPARISON:  None. FINDINGS: Alignment: Slight degenerative anterolisthesis of C2 on C3 and C7 on T1. Skull base and vertebrae: No acute fracture. No primary bone lesion or focal pathologic process. Soft tissues and spinal canal: No prevertebral fluid or swelling. No visible canal hematoma. Disc levels: Diffuse advanced degenerative disc disease. Mild bilateral degenerative facet disease. Upper chest: No acute findings Other: Bilateral carotid artery calcifications. IMPRESSION: Degenerative disc and facet disease.  No acute bony abnormality. Electronically Signed   By: Rolm Baptise M.D.   On: 09/02/2020 20:28   DG Knee Right Port  Result Date: 09/03/2020 CLINICAL DATA:  [place].  Right knee pain. EXAM: PORTABLE RIGHT KNEE -  1-2 VIEW COMPARISON:  None. FINDINGS: Mild to moderate knee joint degenerative changes but no acute fracture, bone lesion or osteochondral abnormality. There is a moderate-sized suprapatellar knee joint effusion noted. IMPRESSION: 1. Mild to moderate knee joint degenerative changes and moderate-sized joint effusion. 2. No acute bony findings. Electronically Signed   By: Marijo Sanes M.D.   On: 09/03/2020 13:13   DG Tibia/Fibula Right Port  Result Date: 09/03/2020 CLINICAL DATA:  Tripped yesterday and injured leg. EXAM: PORTABLE RIGHT TIBIA AND FIBULA - 2 VIEW COMPARISON:  None. FINDINGS: The knee and ankle joints are grossly maintained. Moderate degenerative changes. No acute fracture of the tibia or fibula is identified. IMPRESSION: No acute bony findings. Electronically Signed   By: Marijo Sanes M.D.   On: 09/03/2020 13:12   DG HIP PORT UNILAT WITH PELVIS 1V RIGHT  Result Date: 09/03/2020 CLINICAL DATA:  [place] yesterday.  Right hip pain. EXAM: DG HIP (WITH OR WITHOUT PELVIS) 1V PORT RIGHT COMPARISON:  None. FINDINGS: Both hips are normally located. No acute hip  fracture is identified. The pubic symphysis and SI joints are intact. No definite pelvic fractures. IMPRESSION: No acute bony findings. Electronically Signed   By: Marijo Sanes M.D.   On: 09/03/2020 13:14   (Echo, Carotid, EGD, Colonoscopy, ERCP)    Subjective:  Patient resting in bed daughter by the bedside He was sleeping when I walked into the room.  But I was able to wake him up he is awake alert talking and following commands. Discharge Exam: Vitals:   09/05/20 1832 09/05/20 2134  BP: (!) 127/51 126/85  Pulse: 83 88  Resp: 16 15  Temp: 97.8 F (36.6 C) 98.3 F (36.8 C)  SpO2: 100% 95%   Vitals:   09/05/20 1400 09/05/20 1431 09/05/20 1832 09/05/20 2134  BP: 123/60 (!) 120/44 (!) 127/51 126/85  Pulse: 72 70 83 88  Resp: _0 Temp: 98.6 F (37 C) 98.4 F (36.9 C) 97.8 F (36.6 C) 98.3 F (36.8 C)  TempSrc: Oral Oral Oral Oral  SpO2: 97% 99% 100% 95%  Weight:      Height:        General: Pt is alert, awake, not in acute distress Cardiovascular: RRR, S1/S2 +, no rubs, no gallops Respiratory: CTA bilaterally, no wheezing, no rhonchi Abdominal: Soft, NT, ND, bowel sounds + Extremities: no edema, no cyanosis    The results of significant diagnostics from this hospitalization (including imaging, microbiology, ancillary and laboratory) are listed below for reference.     Microbiology: Recent Results (from the past 240 hour(s))  Respiratory Panel by RT PCR (Flu A&B, Covid) - Nasopharyngeal Swab     Status: None   Collection Time: 09/02/20  9:47 PM   Specimen: Nasopharyngeal Swab  Result Value Ref Range Status   SARS Coronavirus 2 by RT PCR NEGATIVE NEGATIVE Final    Comment: (NOTE) SARS-CoV-2 target nucleic acids are NOT DETECTED.  The SARS-CoV-2 RNA is generally detectable in upper respiratoy specimens during the acute phase of infection. The lowest concentration of SARS-CoV-2 viral copies this assay can detect is 131 copies/mL. A negative result does  not preclude SARS-Cov-2 infection and should not be used as the sole basis for treatment or other patient management decisions. A negative result may occur with  improper specimen collection/handling, submission of specimen other than nasopharyngeal swab, presence of viral mutation(s) within the areas targeted by this assay, and inadequate number of viral copies (<131 copies/mL). A negative result must  be combined with clinical observations, patient history, and epidemiological information. The expected result is Negative.  Fact Sheet for Patients:  PinkCheek.be  Fact Sheet for Healthcare Providers:  GravelBags.it  This test is no t yet approved or cleared by the [place] FDA and  has been authorized for detection and/or diagnosis of SARS-CoV-2 by FDA under an Emergency Use Authorization (EUA). This EUA will remain  in effect (meaning this test can be used) for the duration of the COVID-19 declaration under Section 564(b)(1) of the Act, 21 U.S.C. section 360bbb-3(b)(1), unless the authorization is terminated or revoked sooner.     Influenza A by PCR NEGATIVE NEGATIVE Final   Influenza B by PCR NEGATIVE NEGATIVE Final    Comment: (NOTE) The Xpert Xpress SARS-CoV-2/FLU/RSV assay is intended as an aid in  the diagnosis of influenza from Nasopharyngeal swab specimens and  should not be used as a sole basis for treatment. Nasal washings and  aspirates are unacceptable for Xpert Xpress SARS-CoV-2/FLU/RSV  testing.  Fact Sheet for Patients: PinkCheek.be  Fact Sheet for Healthcare Providers: GravelBags.it  This test is not yet approved or cleared by the [place] FDA and  has been authorized for detection and/or diagnosis of SARS-CoV-2 by  FDA under an Emergency Use Authorization (EUA). This EUA will remain  in effect (meaning this test can be used) for the  duration of the  Covid-19 declaration under Section 564(b)(1) of the Act, 21  U.S.C. section 360bbb-3(b)(1), unless the authorization is  terminated or revoked. Performed at [facility], [place] [address], [place], [place] [address]   Respiratory Panel by RT PCR (Flu A&B, Covid) - Nasopharyngeal Swab     Status: None   Collection Time: 09/04/20  3:25 PM   Specimen: Nasopharyngeal Swab  Result Value Ref Range Status   SARS Coronavirus 2 by RT PCR NEGATIVE NEGATIVE Final    Comment: (NOTE) SARS-CoV-2 target nucleic acids are NOT DETECTED.  The SARS-CoV-2 RNA is generally detectable in upper respiratoy specimens during the acute phase of infection. The lowest concentration of SARS-CoV-2 viral copies this assay can detect is 131 copies/mL. A negative result does not preclude SARS-Cov-2 infection and should not be used as the sole basis for treatment or other patient management decisions. A negative result may occur with  improper specimen collection/handling, submission of specimen other than nasopharyngeal swab, presence of viral mutation(s) within the areas targeted by this assay, and inadequate number of viral copies (<131 copies/mL). A negative result must be combined with clinical observations, patient history, and epidemiological information. The expected result is Negative.  Fact Sheet for Patients:  PinkCheek.be  Fact Sheet for Healthcare Providers:  GravelBags.it  This test is no t yet approved or cleared by the [place] FDA and  has been authorized for detection and/or diagnosis of SARS-CoV-2 by FDA under an Emergency Use Authorization (EUA). This EUA will remain  in effect (meaning this test can be used) for the duration of the COVID-19 declaration under Section 564(b)(1) of the Act, 21 U.S.C. section 360bbb-3(b)(1), unless the authorization is terminated or revoked sooner.     Influenza A  by PCR NEGATIVE NEGATIVE Final   Influenza B by PCR NEGATIVE NEGATIVE Final    Comment: (NOTE) The Xpert Xpress SARS-CoV-2/FLU/RSV assay is intended as an aid in  the diagnosis of influenza from Nasopharyngeal swab specimens and  should not be used as a sole basis for treatment. Nasal washings and  aspirates are unacceptable for Xpert Xpress SARS-CoV-2/FLU/RSV  testing.  Fact Sheet for Patients: PinkCheek.be  Fact Sheet for Healthcare Providers: GravelBags.it  This test is not yet approved or cleared by the [place] FDA and  has been authorized for detection and/or diagnosis of SARS-CoV-2 by  FDA under an Emergency Use Authorization (EUA). This EUA will remain  in effect (meaning this test can be used) for the duration of the  Covid-19 declaration under Section 564(b)(1) of the Act, 21  U.S.C. section 360bbb-3(b)(1), unless the authorization is  terminated or revoked. Performed at [facility], [place] [address] [place], [place] [address]      Labs: BNP (last 3 results) No results for input(s): BNP in the last 8760 hours. Basic Metabolic Panel: Recent Labs  Lab 09/02/20 1925 09/03/20 0519 09/04/20 0611 09/05/20 0441 09/06/20 0818  NA 140 142 138 137 135  K 4.8 3.5 4.2 4.1 3.7  CL 101 104 104 102 101  CO2 _0 GLUCOSE 122* 109* 115* 127* 187*  BUN 26* _1 CREATININE 0.89 0.89 0.93 1.01 0.94  CALCIUM 8.7* 8.5* 8.3* 8.4* 8.1*   Liver Function Tests: No results for input(s): AST, ALT, ALKPHOS, BILITOT, PROT, ALBUMIN in the last 168 hours. No results for input(s): LIPASE, AMYLASE in the last 168 hours. No results for input(s): AMMONIA in the last 168 hours. CBC: Recent Labs  Lab 09/02/20 1925 09/02/20 1925 09/03/20 0519 09/03/20 0519 09/04/20 0611 09/04/20 0836 09/05/20 0441 09/05/20 1352 09/06/20 0818  WBC 51.7*   < > 41.9*  --  36.8*  --  37.5* 44.2* 40.7*   NEUTROABS 38.2*  --   --   --  28.3*  --  27.8* 35.8* 33.8*  HGB 9.0*   < > 7.5*   < > 7.1* 7.4* 7.1* 7.3* 8.2*  HCT 31.7*   < > 26.5*   < > 25.1* 26.1* 24.6* 26.0* 28.1*  MCV 88.1   < > 88.6  --  89.0  --  86.9 89.3 88.1  PLT 591*   < > 525*  --  465*  --  442* 494* 433*   < > = values in this interval not displayed.   Cardiac Enzymes: No results for input(s): CKTOTAL, CKMB, CKMBINDEX, TROPONINI in the last 168 hours. BNP: Invalid input(s): POCBNP CBG: No results for input(s): GLUCAP in the last 168 hours. D-Dimer No results for input(s): DDIMER in the last 72 hours. Hgb A1c No results for input(s): HGBA1C in the last 72 hours. Lipid Profile No results for input(s): CHOL, HDL, LDLCALC, TRIG, CHOLHDL, LDLDIRECT in the last 72 hours. Thyroid function studies No results for input(s): TSH, T4TOTAL, T3FREE, THYROIDAB in the last 72 hours.  Invalid input(s): FREET3 Anemia work up Recent Labs    09/04/20 0611 09/04/20 0836  FERRITIN 192  --   TIBC 252  --   IRON 33*  --   RETICCTPCT  --  4.0*   Urinalysis    Component Value Date/Time   COLORURINE AMBER (A) 10/26/2014 1727   APPEARANCEUR CLEAR 10/26/2014 1727   LABSPEC 1.020 10/26/2014 1727   PHURINE 5.5 10/26/2014 1727   GLUCOSEU NEGATIVE 10/26/2014 1727   HGBUR LARGE (A) 10/26/2014 1727   BILIRUBINUR NEGATIVE 10/26/2014 1727   KETONESUR NEGATIVE 10/26/2014 1727   PROTEINUR NEGATIVE 10/26/2014 1727   UROBILINOGEN 1.0 10/26/2014 1727   NITRITE NEGATIVE 10/26/2014 1727   LEUKOCYTESUR SMALL (A) 10/26/2014 1727   Sepsis Labs Invalid input(s): PROCALCITONIN,  WBC,  LACTICIDVEN  Microbiology Recent Results (from the past 240 hour(s))  Respiratory Panel by RT PCR (Flu A&B, Covid) - Nasopharyngeal Swab     Status: None   Collection Time: 09/02/20  9:47 PM   Specimen: Nasopharyngeal Swab  Result Value Ref Range Status   SARS Coronavirus 2 by RT PCR NEGATIVE NEGATIVE Final    Comment: (NOTE) SARS-CoV-2 target nucleic acids  are NOT DETECTED.  The SARS-CoV-2 RNA is generally detectable in upper respiratoy specimens during the acute phase of infection. The lowest concentration of SARS-CoV-2 viral copies this assay can detect is 131 copies/mL. A negative result does not preclude SARS-Cov-2 infection and should not be used as the sole basis for treatment or other patient management decisions. A negative result may occur with  improper specimen collection/handling, submission of specimen other than nasopharyngeal swab, presence of viral mutation(s) within the areas targeted by this assay, and inadequate number of viral copies (<131 copies/mL). A negative result must be combined with clinical observations, patient history, and epidemiological information. The expected result is Negative.  Fact Sheet for Patients:  PinkCheek.be  Fact Sheet for Healthcare Providers:  GravelBags.it  This test is no t yet approved or cleared by the [place] FDA and  has been authorized for detection and/or diagnosis of SARS-CoV-2 by FDA under an Emergency Use Authorization (EUA). This EUA will remain  in effect (meaning this test can be used) for the duration of the COVID-19 declaration under Section 564(b)(1) of the Act, 21 U.S.C. section 360bbb-3(b)(1), unless the authorization is terminated or revoked sooner.     Influenza A by PCR NEGATIVE NEGATIVE Final   Influenza B by PCR NEGATIVE NEGATIVE Final    Comment: (NOTE) The Xpert Xpress SARS-CoV-2/FLU/RSV assay is intended as an aid in  the diagnosis of influenza from Nasopharyngeal swab specimens and  should not be used as a sole basis for treatment. Nasal washings and  aspirates are unacceptable for Xpert Xpress SARS-CoV-2/FLU/RSV  testing.  Fact Sheet for Patients: PinkCheek.be  Fact Sheet for Healthcare Providers: GravelBags.it  This test is not  yet approved or cleared by the [place] FDA and  has been authorized for detection and/or diagnosis of SARS-CoV-2 by  FDA under an Emergency Use Authorization (EUA). This EUA will remain  in effect (meaning this test can be used) for the duration of the  Covid-19 declaration under Section 564(b)(1) of the Act, 21  U.S.C. section 360bbb-3(b)(1), unless the authorization is  terminated or revoked. Performed at [facility], [place] [address], [place], [place] [address]   Respiratory Panel by RT PCR (Flu A&B, Covid) - Nasopharyngeal Swab     Status: None   Collection Time: 09/04/20  3:25 PM   Specimen: Nasopharyngeal Swab  Result Value Ref Range Status   SARS Coronavirus 2 by RT PCR NEGATIVE NEGATIVE Final    Comment: (NOTE) SARS-CoV-2 target nucleic acids are NOT DETECTED.  The SARS-CoV-2 RNA is generally detectable in upper respiratoy specimens during the acute phase of infection. The lowest concentration of SARS-CoV-2 viral copies this assay can detect is 131 copies/mL. A negative result does not preclude SARS-Cov-2 infection and should not be used as the sole basis for treatment or other patient management decisions. A negative result may occur with  improper specimen collection/handling, submission of specimen other than nasopharyngeal swab, presence of viral mutation(s) within the areas targeted by this assay, and inadequate number of viral copies (<131 copies/mL). A negative result must be combined with clinical observations, patient history,  and epidemiological information. The expected result is Negative.  Fact Sheet for Patients:  PinkCheek.be  Fact Sheet for Healthcare Providers:  GravelBags.it  This test is no t yet approved or cleared by the [place] FDA and  has been authorized for detection and/or diagnosis of SARS-CoV-2 by FDA under an Emergency Use Authorization (EUA). This EUA will  remain  in effect (meaning this test can be used) for the duration of the COVID-19 declaration under Section 564(b)(1) of the Act, 21 U.S.C. section 360bbb-3(b)(1), unless the authorization is terminated or revoked sooner.     Influenza A by PCR NEGATIVE NEGATIVE Final   Influenza B by PCR NEGATIVE NEGATIVE Final    Comment: (NOTE) The Xpert Xpress SARS-CoV-2/FLU/RSV assay is intended as an aid in  the diagnosis of influenza from Nasopharyngeal swab specimens and  should not be used as a sole basis for treatment. Nasal washings and  aspirates are unacceptable for Xpert Xpress SARS-CoV-2/FLU/RSV  testing.  Fact Sheet for Patients: PinkCheek.be  Fact Sheet for Healthcare Providers: GravelBags.it  This test is not yet approved or cleared by the [place] FDA and  has been authorized for detection and/or diagnosis of SARS-CoV-2 by  FDA under an Emergency Use Authorization (EUA). This EUA will remain  in effect (meaning this test can be used) for the duration of the  Covid-19 declaration under Section 564(b)(1) of the Act, 21  U.S.C. section 360bbb-3(b)(1), unless the authorization is  terminated or revoked. Performed at [facility], [place] [address], [place], [place] [address]      Time coordinating discharge: 39 minutes  SIGNED:   Georgette Shell, MD  [facility] 09/06/2020, 10:25 AM

## 2020-09-06 NOTE — TOC Transition Note (Signed)
Transition of Care [facility]) - CM/SW Discharge Note   Patient Details  Name: William Ibarra MRN: [id number] Date of Birth: 1924/04/04  Transition of Care [facility] CM/SW Contact:  William Ibarra, [place] Phone Number: 09/06/2020, 1:46 PM   Clinical Narrative:    [facility] is ready to accept the patient.  Nurse call report [phone]. [phone] PTAR arranged to transport/ Daughter at bedside.   Final next level of care: Skilled Nursing Facility Barriers to Discharge: Barriers Resolved   Patient Goals and CMS Choice Patient states their goals for this hospitalization and ongoing recovery are:: To return to apartment CMS Medicare.gov Compare Post Acute Care list provided to:: Other (Comment Required) William Ibarra, William Ibarra (Daughter) [phone]) Choice offered to / list presented to : Adult Children William Ibarra, William Ibarra (Daughter) [phone])  Discharge Placement   Existing PASRR number confirmed : 09/06/20          Patient chooses bed at: [facility] Patient to be transferred to facility by: [place] Name of family member notified: William Ibarra, William Ibarra Daughter   [phone] Patient and family notified of of transfer: 09/06/20  Discharge Plan and Services In-house Referral: Clinical Social Work   Post Acute Care Choice: [facility]                               Social Determinants of Health (SDOH) Interventions     Readmission Risk Interventions No flowsheet data found.

## 2020-09-06 NOTE — Progress Notes (Signed)
This encounter was created in error - please disregard.

## 2020-09-06 NOTE — NC FL2 (Addendum)
[place] MEDICAID FL2 LEVEL OF CARE SCREENING TOOL     IDENTIFICATION  Patient Name: William Ibarra Birthdate: 1923/11/09 Sex: male Admission Date (Current Location): 09/02/2020  [facility] and [place] Number:  Herbalist and Address:  [facility],  [place], [place]      Provider Number: [id number]  Attending Physician Name and Address:  Georgette Shell, MD  Relative Name and Phone Number:  [place], [place] (Daughter) [phone]    Current Level of Care: Hospital Recommended Level of Care: [place] Prior Approval Number:    Date Approved/Denied:   PASRR Number: [id number]  Discharge Plan: SNF    Current Diagnoses: Patient Active Problem List   Diagnosis Date Noted  . Chronic myeloid leukemia ([place]) 09/03/2020  . HTN (hypertension) 09/02/2020  . Laceration of head 09/02/2020  . Hematoma 10/24/2014  . Hypotension 10/24/2014  . Anemia 02/06/2012  . Hypotension 02/05/2012  . Anticoagulation excessive 02/05/2012  . Diabetes mellitus type 2, controlled ([place]) 02/05/2012  . Atrial fibrillation ([place]) 02/05/2012  . Psoriasis 02/05/2012    Orientation RESPIRATION BLADDER Height & Weight     Self  Normal Incontinent Weight: 195 lb (88.5 kg) Height:  6\' 1"  (185.4 cm)  BEHAVIORAL SYMPTOMS/MOOD NEUROLOGICAL BOWEL NUTRITION STATUS      Continent Diet (Heart Healthy)  AMBULATORY STATUS COMMUNICATION OF NEEDS Skin   Extensive Assist Verbally Bruising, Other (Comment) (Head Laceration)                       Personal Care Assistance Level of Assistance  Bathing, Feeding, Dressing Bathing Assistance: Maximum assistance Feeding assistance: Limited assistance Dressing Assistance: Maximum assistance     Functional Limitations Info  Sight, Hearing, Speech Sight Info: Impaired Hearing Info: Adequate Speech Info: Adequate    SPECIAL CARE FACTORS FREQUENCY  PT (By licensed PT), OT (By licensed OT)      PT Frequency: 5x/week OT Frequency: 5x/week            Contractures Contractures Info: Not present    Additional Factors Info  Code Status, Allergies Code Status Info: DNR Allergies Info: Allergies: No Known Allergies           Current Medications (09/06/2020):  This is the current hospital active medication list Current Facility-Administered Medications  Medication Dose Route Frequency Provider Last Rate Last Admin  . 0.9 %  sodium chloride infusion (Manually program via Guardrails IV Fluids)   Intravenous Once Irene Pap N, DO      . acetaminophen (TYLENOL) tablet 650 mg  650 mg Oral Q6H PRN Etta Quill, DO   650 mg at 09/05/20 2024   Or  . acetaminophen (TYLENOL) suppository 650 mg  650 mg Rectal Q6H PRN Etta Quill, DO      . allopurinol (ZYLOPRIM) tablet 150 mg  150 mg Oral Daily Magrinat, Virgie Dad, MD   150 mg at 09/06/20 0913  . bacitracin ointment   Topical BID Kayleen Memos, DO   Given at 09/06/20 6314  . imatinib (GLEEVEC) tablet 200 mg  200 mg Oral Q breakfast Magrinat, Virgie Dad, MD   200 mg at 09/06/20 0820  . influenza vaccine adjuvanted (FLUAD) injection 0.5 mL  0.5 mL Intramuscular Tomorrow-1000 Jennette Kettle M, DO      . metoprolol succinate (TOPROL-XL) 24 hr tablet 12.5 mg  12.5 mg Oral Daily Irene Pap N, DO   12.5 mg at 09/06/20 0913  . ondansetron (ZOFRAN)  tablet 4 mg  4 mg Oral Q6H PRN Etta Quill, DO       Or  . ondansetron [facility]) injection 4 mg  4 mg Intravenous Q6H PRN Etta Quill, DO      . tamsulosin [facility]) capsule 0.4 mg  0.4 mg Oral QHS Jennette Kettle M, DO   0.4 mg at 09/05/20 2024     Discharge Medications: Please see discharge summary for a list of discharge medications.  Relevant Imaging Results:  Relevant Lab Results:   Additional Information SS# [id number]  Lia Hopping, LCSW

## 2020-09-06 NOTE — Progress Notes (Signed)
Report called to [facility] spoke with RN Katie [phone] ext [phone]

## 2020-09-07 ENCOUNTER — Encounter: Payer: Self-pay | Admitting: Internal Medicine

## 2020-09-07 ENCOUNTER — Non-Acute Institutional Stay (SKILLED_NURSING_FACILITY): Payer: Medicare Other | Admitting: Internal Medicine

## 2020-09-07 DIAGNOSIS — D649 Anemia, unspecified: Secondary | ICD-10-CM | POA: Diagnosis not present

## 2020-09-07 DIAGNOSIS — M79604 Pain in right leg: Secondary | ICD-10-CM

## 2020-09-07 DIAGNOSIS — R296 Repeated falls: Secondary | ICD-10-CM | POA: Diagnosis not present

## 2020-09-07 DIAGNOSIS — I4891 Unspecified atrial fibrillation: Secondary | ICD-10-CM | POA: Diagnosis not present

## 2020-09-07 DIAGNOSIS — R600 Localized edema: Secondary | ICD-10-CM

## 2020-09-07 DIAGNOSIS — C9210 Chronic myeloid leukemia, BCR/ABL-positive, not having achieved remission: Secondary | ICD-10-CM | POA: Diagnosis not present

## 2020-09-07 DIAGNOSIS — K5900 Constipation, unspecified: Secondary | ICD-10-CM | POA: Diagnosis not present

## 2020-09-07 NOTE — Progress Notes (Signed)
Provider:  Veleta Miners MD Location:    [place] Room Number: 4 Place of Service:  SNF (31)  PCP: Shon Baton, MD Patient Care Team: Shon Baton, MD as PCP - General (Internal Medicine)  Extended Emergency Contact Information Primary Emergency Contact: Cinda Quest Address: [address] [place], [place] [address] Johnnette Litter of [place] Phone: [phone] Relation: Daughter Secondary Emergency Contact: Jeffie Pollock [place] of [place] Phone: [phone] Mobile Phone: [phone] Relation: Daughter  Code Status: Full Code Goals of Care: Advanced Directive information Advanced Directives 09/03/2020  Does Patient Have a Medical Advance Directive? Yes  Type of Advance Directive Living will;Healthcare Power of Attorney  Does patient want to make changes to medical advance directive? No - Patient declined  Copy of [place] in Chart? -      Chief Complaint  Patient presents with  . New Admit To SNF    Admission to SNF    HPI: Patient is a 86 y.o. male seen today for admission to SNF  Patient has a history of A. fib not on [place] due to bleeding issues History of recurrent falls has worked with therapy before also right leg drop wears brace History of elevated white count has refused work-up in the past BPH ,anemia  Patient had a mechanical fall in his apartment in [facility]. Denies any dizziness. Just lost his balance. Did  hit his head In ED was found to have a laceration with hematoma on his scalp. Patient also had a WBC of 51K He was evaluated by Dr. Jana Hakim. Since he refused bone marrow he was started on Gleevec as the peripheral smear was consistent with CML His hemoglobin dropped to 7.1 and he was transfused 1 unit He also had a right knee pain. Multiple x-rays were negative for any fracture His Lasix dose was reduced to 3 times a week due to soft blood pressure  Today  patient's main complaint was pain in his right knee. He was also complaining of constipation. And feeling weak No other acute complaints   Past Medical History:  Diagnosis Date  . Atrial fibrillation ([place])   . BPH (benign prostatic hypertrophy)   . Diabetes mellitus   . Diverticulosis of colon    from a prior colonoscopy  . History of blood transfusion ~ 10/2013; 10/24/2014   "related to blood thinners"  . Hydrocele   . Hydrocele   . Hypertension   . Hypotension 02/05/2012  . Impaired glucose tolerance   . Overactive bladder   . Psoriasis   . Rosacea   . Spermatocele    Past Surgical History:  Procedure Laterality Date  . CATARACT EXTRACTION W/ INTRAOCULAR LENS  IMPLANT, BILATERAL Bilateral    "successful in left eye; redid right"  . HYDROCELE EXCISION / REPAIR    . MELANOMA EXCISION Right    eye  . TONSILLECTOMY      reports that he quit smoking about 28 years ago. His smoking use included cigarettes. He has a 15.00 pack-year smoking history. He has never used smokeless tobacco. He reports current alcohol use of about 7.0 standard drinks of alcohol per week. He reports that he does not use drugs. Social History   Socioeconomic History  . Marital status: Widowed    Spouse name: Not on file  . Number of children: Not on file  . Years of education: Not on file  . Highest education  level: Not on file  Occupational History  . Not on file  Tobacco Use  . Smoking status: Former Smoker    Packs/day: 0.50    Years: 30.00    Pack years: 15.00    Types: Cigarettes    Quit date: 02/05/1992    Years since quitting: 28.6  . Smokeless tobacco: Never Used  Substance and Sexual Activity  . Alcohol use: Yes    Alcohol/week: 7.0 standard drinks    Types: 7 Glasses of wine per week  . Drug use: No  . Sexual activity: Never  Other Topics Concern  . Not on file  Social History Narrative  . Not on file   Social Determinants of Health   Financial Resource Strain:   . Difficulty  of Paying Living Expenses: Not on file  Food Insecurity:   . Worried About Charity fundraiser in the Last Year: Not on file  . Ran Out of Food in the Last Year: Not on file  Transportation Needs:   . Lack of Transportation (Medical): Not on file  . Lack of Transportation (Non-Medical): Not on file  Physical Activity:   . Days of Exercise per Week: Not on file  . Minutes of Exercise per Session: Not on file  Stress:   . Feeling of Stress : Not on file  Social Connections:   . Frequency of Communication with Friends and Family: Not on file  . Frequency of Social Gatherings with Friends and Family: Not on file  . Attends Religious Services: Not on file  . Active Member of Clubs or Organizations: Not on file  . Attends Archivist Meetings: Not on file  . Marital Status: Not on file  Intimate Partner Violence:   . Fear of Current or Ex-Partner: Not on file  . Emotionally Abused: Not on file  . Physically Abused: Not on file  . Sexually Abused: Not on file    Functional Status Survey:    Family History  Problem Relation Age of Onset  . Bleeding Disorder Neg Hx     Health Maintenance  Topic Date Due  . HEMOGLOBIN A1C  Never done  . FOOT EXAM  Never done  . OPHTHALMOLOGY EXAM  Never done  . URINE MICROALBUMIN  Never done  . PNA vac Low Risk Adult (1 of 2 - PCV13) Never done  . INFLUENZA VACCINE  06/04/2020  . TETANUS/TDAP  09/10/2025  . COVID-19 Vaccine  Completed    No Known Allergies  Allergies as of 09/07/2020   No Known Allergies     Medication List       Accurate as of September 07, 2020  9:25 AM. If you have any questions, ask your nurse or doctor.        allopurinol 300 MG tablet Commonly known as: ZYLOPRIM Take 0.5 tablets (150 mg total) by mouth daily.   bacitracin ointment Apply topically 2 (two) times daily.   furosemide 20 MG tablet Commonly known as: LASIX Lasix 20 mg Monday Wednesday and Friday   imatinib 100 MG tablet Commonly  known as: GLEEVEC Take 2 tablets (200 mg total) by mouth daily with breakfast. Take with meals and large glass of water.Caution:Chemotherapy   metoprolol succinate 25 MG 24 hr tablet Commonly known as: TOPROL-XL Take 0.5 tablets (12.5 mg total) by mouth daily.   ondansetron 4 MG tablet Commonly known as: ZOFRAN Take 1 tablet (4 mg total) by mouth every 6 (six) hours as needed for nausea.  potassium chloride 10 MEQ CR capsule Commonly known as: MICRO-K Potassium 10 mEq Monday Wednesday and Friday with Lasix only   tamsulosin 0.4 MG Caps capsule Commonly known as: FLOMAX Take 1 capsule (0.4 mg total) by mouth at bedtime.   Tubersol 5 UNIT/0.1ML injection Generic drug: tuberculin Inject 5 Units into the skin once. Every 14 days   zinc oxide 20 % ointment Apply 1 application topically as needed for irritation.       Review of Systems  Constitutional: Positive for activity change and appetite change.  HENT: Negative.   Respiratory: Negative.   Cardiovascular: Positive for leg swelling.  Gastrointestinal: Positive for constipation.  Genitourinary: Negative.   Musculoskeletal: Positive for gait problem.  Skin: Positive for color change.  Neurological: Positive for weakness.  Psychiatric/Behavioral: Negative.     Vitals:   09/07/20 0916  BP: 106/60  Pulse: 82  Resp: 20  Temp: 98.4 F (36.9 C)  SpO2: 93%  Weight: 195 lb 3.2 oz (88.5 kg)  Height: 6' (1.829 m)   Body mass index is 26.47 kg/m. Physical Exam Vitals reviewed.  Constitutional:      Appearance: Normal appearance.  HENT:     Head: Normocephalic.     Comments: Head laceration Staples in Place    Nose: Nose normal.     Mouth/Throat:     Mouth: Mucous membranes are moist.     Pharynx: Oropharynx is clear.  Eyes:     Pupils: Pupils are equal, round, and reactive to light.  Cardiovascular:     Rate and Rhythm: Normal rate. Rhythm irregular.  Pulmonary:     Effort: Pulmonary effort is normal.      Breath sounds: Normal breath sounds.  Abdominal:     General: Abdomen is flat. Bowel sounds are normal.     Palpations: Abdomen is soft.  Musculoskeletal:        General: Swelling present.     Cervical back: Neck supple.     Comments: Bilateral Swelling  C/O Pain in Right leg Which has bruise but not warm or tender  Skin:    General: Skin is warm.     Comments: Bruises and hematoma in both legs and hands Also Has skin tear  Neurological:     General: No focal deficit present.     Mental Status: He is alert and oriented to person, place, and time.  Psychiatric:        Mood and Affect: Mood normal.        Thought Content: Thought content normal.     Labs reviewed: Basic Metabolic Panel: Recent Labs    09/04/20 0611 09/05/20 0441 09/06/20 0818  NA 138 137 135  K 4.2 4.1 3.7  CL 104 102 101  CO2 25 26 25   GLUCOSE 115* 127* 187*  BUN 18 19 16   CREATININE 0.93 1.01 0.94  CALCIUM 8.3* 8.4* 8.1*   Liver Function Tests: No results for input(s): AST, ALT, ALKPHOS, BILITOT, PROT, ALBUMIN in the last 8760 hours. No results for input(s): LIPASE, AMYLASE in the last 8760 hours. No results for input(s): AMMONIA in the last 8760 hours. CBC: Recent Labs    09/05/20 0441 09/05/20 1352 09/06/20 0818  WBC 37.5* 44.2* 40.7*  NEUTROABS 27.8* 35.8* 33.8*  HGB 7.1* 7.3* 8.2*  HCT 24.6* 26.0* 28.1*  MCV 86.9 89.3 88.1  PLT 442* 494* 433*   Cardiac Enzymes: No results for input(s): CKTOTAL, CKMB, CKMBINDEX, TROPONINI in the last 8760 hours. BNP: Invalid input(s): POCBNP No  results found for: HGBA1C No results found for: TSH No results found for: VITAMINB12 No results found for: FOLATE Lab Results  Component Value Date   IRON 33 (L) 09/04/2020   TIBC 252 09/04/2020   FERRITIN 192 09/04/2020    Imaging and Procedures obtained prior to SNF admission: DG Chest 1 View  Result Date: 09/02/2020 CLINICAL DATA:  Status post fall. EXAM: CHEST  1 VIEW COMPARISON:  June 08, 2015  FINDINGS: Cardiomediastinal silhouette is normal. Mediastinal contours appear intact. Mild calcific atherosclerotic disease of the aorta. There is no evidence of focal airspace consolidation, pleural effusion or pneumothorax. Osseous structures are without acute abnormality. Soft tissues are grossly normal. IMPRESSION: No active disease. Electronically Signed   By: Fidela Salisbury M.D.   On: 09/02/2020 19:53   DG Pelvis 1-2 Views  Result Date: 09/02/2020 CLINICAL DATA:  Fall EXAM: PELVIS - 1-2 VIEW COMPARISON:  None. FINDINGS: There is no evidence of pelvic fracture or diastasis. No pelvic bone lesions are seen. IMPRESSION: Negative. Electronically Signed   By: Rolm Baptise M.D.   On: 09/02/2020 19:55   DG Tibia/Fibula Left  Result Date: 09/02/2020 CLINICAL DATA:  Fall EXAM: LEFT TIBIA AND FIBULA - 2 VIEW COMPARISON:  None. FINDINGS: No acute bony abnormality. Specifically, no fracture, subluxation, or dislocation. No radiopaque foreign body. IMPRESSION: No acute bony abnormality. Electronically Signed   By: Rolm Baptise M.D.   On: 09/02/2020 19:57   CT Head Wo Contrast  Result Date: 09/02/2020 CLINICAL DATA:  Fall, hit head EXAM: CT HEAD WITHOUT CONTRAST TECHNIQUE: Contiguous axial images were obtained from the base of the skull through the vertex without intravenous contrast. COMPARISON:  None. FINDINGS: Brain: There is atrophy and chronic small vessel disease changes. No acute intracranial abnormality. Specifically, no hemorrhage, hydrocephalus, mass lesion, acute infarction, or significant intracranial injury. Vascular: No hyperdense vessel or unexpected calcification. Skull: No acute calvarial abnormality. Sinuses/Orbits: Visualized paranasal sinuses and mastoids clear. Orbital soft tissues unremarkable. Other: Soft tissue swelling posterior scalp. IMPRESSION: Atrophy, chronic microvascular disease. No acute intracranial abnormality. Electronically Signed   By: Rolm Baptise M.D.   On:  09/02/2020 20:28   CT Cervical Spine Wo Contrast  Result Date: 09/02/2020 CLINICAL DATA:  Fall, hit back of head. EXAM: CT CERVICAL SPINE WITHOUT CONTRAST TECHNIQUE: Multidetector CT imaging of the cervical spine was performed without intravenous contrast. Multiplanar CT image reconstructions were also generated. COMPARISON:  None. FINDINGS: Alignment: Slight degenerative anterolisthesis of C2 on C3 and C7 on T1. Skull base and vertebrae: No acute fracture. No primary bone lesion or focal pathologic process. Soft tissues and spinal canal: No prevertebral fluid or swelling. No visible canal hematoma. Disc levels: Diffuse advanced degenerative disc disease. Mild bilateral degenerative facet disease. Upper chest: No acute findings Other: Bilateral carotid artery calcifications. IMPRESSION: Degenerative disc and facet disease.  No acute bony abnormality. Electronically Signed   By: Rolm Baptise M.D.   On: 09/02/2020 20:28   DG Knee Right Port  Result Date: 09/03/2020 CLINICAL DATA:  [place].  Right knee pain. EXAM: PORTABLE RIGHT KNEE - 1-2 VIEW COMPARISON:  None. FINDINGS: Mild to moderate knee joint degenerative changes but no acute fracture, bone lesion or osteochondral abnormality. There is a moderate-sized suprapatellar knee joint effusion noted. IMPRESSION: 1. Mild to moderate knee joint degenerative changes and moderate-sized joint effusion. 2. No acute bony findings. Electronically Signed   By: Marijo Sanes M.D.   On: 09/03/2020 13:13   DG Tibia/Fibula Right Port  Result Date:  09/03/2020 CLINICAL DATA:  Tripped yesterday and injured leg. EXAM: PORTABLE RIGHT TIBIA AND FIBULA - 2 VIEW COMPARISON:  None. FINDINGS: The knee and ankle joints are grossly maintained. Moderate degenerative changes. No acute fracture of the tibia or fibula is identified. IMPRESSION: No acute bony findings. Electronically Signed   By: Marijo Sanes M.D.   On: 09/03/2020 13:12   DG HIP PORT UNILAT WITH PELVIS 1V  RIGHT  Result Date: 09/03/2020 CLINICAL DATA:  [place] yesterday.  Right hip pain. EXAM: DG HIP (WITH OR WITHOUT PELVIS) 1V PORT RIGHT COMPARISON:  None. FINDINGS: Both hips are normally located. No acute hip fracture is identified. The pubic symphysis and SI joints are intact. No definite pelvic fractures. IMPRESSION: No acute bony findings. Electronically Signed   By: Marijo Sanes M.D.   On: 09/03/2020 13:14    Assessment/Plan Bilateral leg edema Have ot restart his lasix at 20 mg QD as worsening of swelling  Chronic myeloid leukemia ([place]) On Gleevec and Allopurinal Recurrent falls Will work with Therapy Pain of right lower extremity Most likely due to Bruises/ hematoma Tylenol prn Anemia, unspecified type Repeat CBC Constipation, unspecified constipation type Start on Docusate Atrial fibrillation, unspecified type ([place]) No anticoagulation due to Previous bleeding issues On low dose of Toprol BPH On flomax Deconditiong Tylenol 650 mg Tid pRn Therapy  Right leg drop Brace Family/ staff Communication:   Labs/tests ordered:CBC,CMP,Uric acid in 1 week

## 2020-09-08 ENCOUNTER — Encounter: Payer: Self-pay | Admitting: Oncology

## 2020-09-08 ENCOUNTER — Telehealth: Payer: Self-pay

## 2020-09-08 NOTE — Telephone Encounter (Signed)
This LPN spoke with daughter who reported sx of delirium since beginning [place] and Allopurinol. Pt is currently residing at [facility] for skilled nursing, and his nurse reports she has not seen this behavior from patient, and the daughter has not reported this to the staff at [facility]. Per Dr Jana Hakim, pt should stop Gleevec then we will reassess Monday to see if this helps. Order to stop [place] faxed to [facility] at [phone]. This LPN called pt's daughter and LVM letting her know order was faxed.

## 2020-09-11 ENCOUNTER — Telehealth: Payer: Self-pay

## 2020-09-11 DIAGNOSIS — D649 Anemia, unspecified: Secondary | ICD-10-CM | POA: Diagnosis not present

## 2020-09-11 DIAGNOSIS — I10 Essential (primary) hypertension: Secondary | ICD-10-CM | POA: Diagnosis not present

## 2020-09-11 DIAGNOSIS — E790 Hyperuricemia without signs of inflammatory arthritis and tophaceous disease: Secondary | ICD-10-CM | POA: Diagnosis not present

## 2020-09-11 LAB — CBC AND DIFFERENTIAL
HCT: 25 — AB (ref 41–53)
Hemoglobin: 7.4 — AB (ref 13.5–17.5)
Platelets: 484 — AB (ref 150–399)
WBC: 42.9

## 2020-09-11 LAB — BCR-ABL1, CML/ALL, PCR, QUANT: Interpretation (BCRAL):: NEGATIVE

## 2020-09-11 LAB — BASIC METABOLIC PANEL
BUN: 22 — AB (ref 4–21)
CO2: 29 — AB (ref 13–22)
Chloride: 101 (ref 99–108)
Creatinine: 0.9 (ref 0.6–1.3)
Glucose: 114
Potassium: 4.4 (ref 3.4–5.3)
Sodium: 135 — AB (ref 137–147)

## 2020-09-11 LAB — COMPREHENSIVE METABOLIC PANEL: Calcium: 7.8 — AB (ref 8.7–10.7)

## 2020-09-11 LAB — CBC: RBC: 2.98 — AB (ref 3.87–5.11)

## 2020-09-11 NOTE — Telephone Encounter (Signed)
[facility] called to update MD regarding any behavorial changes since stopping Gleevec 200mg .   Nurse at [facility] reports patient's behavior has been "normal."  Nurse denies any signs of confusion or delirium.    RN notified MD - Per MD patient to resume [place] 200mg  daily with breakfast.  RN notified patient's daughter, Eustaquio Maize.  Order faxed to [facility] successfully [phone].

## 2020-09-14 ENCOUNTER — Non-Acute Institutional Stay (SKILLED_NURSING_FACILITY): Payer: Medicare Other | Admitting: Internal Medicine

## 2020-09-14 ENCOUNTER — Encounter: Payer: Self-pay | Admitting: Internal Medicine

## 2020-09-14 ENCOUNTER — Telehealth: Payer: Self-pay | Admitting: Oncology

## 2020-09-14 DIAGNOSIS — R600 Localized edema: Secondary | ICD-10-CM | POA: Diagnosis not present

## 2020-09-14 DIAGNOSIS — D649 Anemia, unspecified: Secondary | ICD-10-CM

## 2020-09-14 DIAGNOSIS — I4891 Unspecified atrial fibrillation: Secondary | ICD-10-CM | POA: Diagnosis not present

## 2020-09-14 DIAGNOSIS — R296 Repeated falls: Secondary | ICD-10-CM

## 2020-09-14 DIAGNOSIS — F0390 Unspecified dementia without behavioral disturbance: Secondary | ICD-10-CM | POA: Diagnosis not present

## 2020-09-14 DIAGNOSIS — C9210 Chronic myeloid leukemia, BCR/ABL-positive, not having achieved remission: Secondary | ICD-10-CM | POA: Diagnosis not present

## 2020-09-14 DIAGNOSIS — E790 Hyperuricemia without signs of inflammatory arthritis and tophaceous disease: Secondary | ICD-10-CM | POA: Diagnosis not present

## 2020-09-14 LAB — CBC AND DIFFERENTIAL
HCT: 24 — AB (ref 41–53)
Hemoglobin: 7.3 — AB (ref 13.5–17.5)
Platelets: 386 (ref 150–399)
WBC: 32.6

## 2020-09-14 LAB — BASIC METABOLIC PANEL
BUN: 27 — AB (ref 4–21)
CO2: 24 — AB (ref 13–22)
Chloride: 98 — AB (ref 99–108)
Creatinine: 0.8 (ref 0.6–1.3)
Glucose: 54
Potassium: 4.7 (ref 3.4–5.3)
Sodium: 136 — AB (ref 137–147)

## 2020-09-14 LAB — COMPREHENSIVE METABOLIC PANEL
Albumin: 2.7 — AB (ref 3.5–5.0)
Calcium: 7.9 — AB (ref 8.7–10.7)
Globulin: 1.6

## 2020-09-14 LAB — HEPATIC FUNCTION PANEL
ALT: 5 — AB (ref 10–40)
AST: 9 — AB (ref 14–40)
Alkaline Phosphatase: 106 (ref 25–125)
Bilirubin, Total: 0.5

## 2020-09-14 LAB — CBC: RBC: 2.92 — AB (ref 3.87–5.11)

## 2020-09-14 NOTE — Telephone Encounter (Signed)
Contacted patient to verify mychart video visit for pre reg 

## 2020-09-14 NOTE — Progress Notes (Signed)
Location:    [place] Room Number: 4 Place of Service:  SNF [phone]) Provider:  Veleta Miners MD  Shon Baton, MD  Patient Care Team: Shon Baton, MD as PCP - General (Internal Medicine)  Extended Emergency Contact Information Primary Emergency Contact: Cinda Quest Address: [address] [place], [place] [address] Johnnette Litter of [place] Phone: [phone] Relation: Daughter Secondary Emergency Contact: Jeffie Pollock [place] of [place] Phone: [phone] Mobile Phone: [phone] Relation: Daughter  Code Status:  Full Code Goals of care: Advanced Directive information Advanced Directives 09/14/2020  Does Patient Have a Medical Advance Directive? Yes  Type of Advance Directive Living will;Healthcare Power of Attorney  Does patient want to make changes to medical advance directive? No - Patient declined  Copy of [place] in Chart? Yes - validated most recent copy scanned in chart (See row information)     Chief Complaint  Patient presents with  . Acute Visit    LE edema and discharge     HPI:  Pt is a 86 y.o. male seen today for an acute visit for LE edema and Discharge from his legs  Patient has a history of A. fib not on [place] due to bleeding issues History of recurrent falls has worked with therapy before also right leg drop wears brace History of elevated white count has refused work-up in the past due to [facility] BPH ,anemia Was admitted in the hospital from 10/30-11/03 for Anemia due to [facility], Fall with Scalp hematoma  He is now in SNF for Therapy Since being here has had LE edema Started on Lasix 20 mg QD Has lost few pounds but now has a place in Right inner Thigh which is leaking clear fluid No Redness or Pain No Fever. No SOB or cough Also Family thinks he is not eating well. Weak. Not sleeping well     Past Medical History:  Diagnosis Date  . Atrial fibrillation ([place])     . BPH (benign prostatic hypertrophy)   . Diabetes mellitus   . Diverticulosis of colon    from a prior colonoscopy  . History of blood transfusion ~ 10/2013; 10/24/2014   "related to blood thinners"  . Hydrocele   . Hydrocele   . Hypertension   . Hypotension 02/05/2012  . Impaired glucose tolerance   . Overactive bladder   . Psoriasis   . Rosacea   . Spermatocele    Past Surgical History:  Procedure Laterality Date  . CATARACT EXTRACTION W/ INTRAOCULAR LENS  IMPLANT, BILATERAL Bilateral    "successful in left eye; redid right"  . HYDROCELE EXCISION / REPAIR    . MELANOMA EXCISION Right    eye  . TONSILLECTOMY      No Known Allergies  Allergies as of 09/14/2020   No Known Allergies     Medication List       Accurate as of September 14, 2020 11:59 PM. If you have any questions, ask your nurse or doctor.        STOP taking these medications   ondansetron 4 MG tablet Commonly known as: ZOFRAN Stopped by: Virgie Dad, MD     TAKE these medications   acetaminophen 325 MG tablet Commonly known as: TYLENOL Take 650 mg by mouth every 6 (six) hours as needed.   allopurinol 300 MG tablet Commonly known as: ZYLOPRIM Take 0.5 tablets (150 mg total) by mouth daily.  bacitracin ointment Apply topically 2 (two) times daily.   docusate sodium 100 MG capsule Commonly known as: COLACE Take 100 mg by mouth daily.   furosemide 20 MG tablet Commonly known as: LASIX Take 20 mg by mouth daily. What changed: Another medication with the same name was removed. Continue taking this medication, and follow the directions you see here. Changed by: Virgie Dad, MD   imatinib 100 MG tablet Commonly known as: GLEEVEC Take 2 tablets (200 mg total) by mouth daily with breakfast. Take with meals and large glass of water.Caution:Chemotherapy   metoprolol succinate 25 MG 24 hr tablet Commonly known as: TOPROL-XL Take 0.5 tablets (12.5 mg total) by mouth daily.   potassium  chloride 10 MEQ tablet Commonly known as: KLOR-CON Take 10 mEq by mouth daily. What changed: Another medication with the same name was removed. Continue taking this medication, and follow the directions you see here. Changed by: Virgie Dad, MD   tamsulosin 0.4 MG Caps capsule Commonly known as: FLOMAX Take 1 capsule (0.4 mg total) by mouth at bedtime.   Tubersol 5 UNIT/0.1ML injection Generic drug: tuberculin Inject 5 Units into the skin once. Every 14 days   zinc oxide 20 % ointment Apply 1 application topically as needed for irritation.       Review of Systems  Constitutional: Positive for activity change and appetite change.  HENT: Negative.   Respiratory: Negative.   Cardiovascular: Positive for leg swelling.  Gastrointestinal: Positive for constipation.  Genitourinary: Negative.   Skin: Positive for wound.  Neurological: Positive for weakness. Negative for dizziness.  Psychiatric/Behavioral: Positive for sleep disturbance.    Immunization History  Administered Date(s) Administered  . Moderna SARS-COVID-2 Vaccination 11/08/2019, 12/06/2019  . Pneumococcal-Unspecified 09/08/2020  . Tdap 09/11/2015   Pertinent  Health Maintenance Due  Topic Date Due  . HEMOGLOBIN A1C  Never done  . FOOT EXAM  Never done  . OPHTHALMOLOGY EXAM  Never done  . URINE MICROALBUMIN  Never done  . INFLUENZA VACCINE  06/04/2020  . PNA vac Low Risk Adult (2 of 2 - PCV13) 09/08/2021   No flowsheet data found. Functional Status Survey:    Vitals:   09/14/20 1056  BP: 106/69  Pulse: 74  Resp: 20  Temp: 98 F (36.7 C)  SpO2: 100%  Weight: 191 lb 11.2 oz (87 kg)  Height: 6' (1.829 m)   Body mass index is 26 kg/m. Physical Exam Vitals reviewed.  Constitutional:      Appearance: Normal appearance.  HENT:     Head: Normocephalic.     Nose: Nose normal.     Mouth/Throat:     Mouth: Mucous membranes are moist.     Pharynx: Oropharynx is clear.  Eyes:     Pupils: Pupils are  equal, round, and reactive to light.  Cardiovascular:     Rate and Rhythm: Normal rate. Rhythm irregular.     Pulses: Normal pulses.  Pulmonary:     Effort: Pulmonary effort is normal.     Breath sounds: Normal breath sounds.  Abdominal:     General: Abdomen is flat. Bowel sounds are normal.     Palpations: Abdomen is soft.  Musculoskeletal:        General: Swelling present.     Cervical back: Neck supple.  Skin:    General: Skin is warm.     Comments: Small Place in Inner thigh which has discharge clear. No Odor or redness or Pain  Neurological:  General: No focal deficit present.     Mental Status: He is alert and oriented to person, place, and time.  Psychiatric:        Mood and Affect: Mood normal.     Labs reviewed: Recent Labs    09/04/20 0611 09/05/20 0441 09/06/20 0818  NA 138 137 135  K 4.2 4.1 3.7  CL 104 102 101  CO2 25 26 25   GLUCOSE 115* 127* 187*  BUN 18 19 16   CREATININE 0.93 1.01 0.94  CALCIUM 8.3* 8.4* 8.1*   No results for input(s): AST, ALT, ALKPHOS, BILITOT, PROT, ALBUMIN in the last 8760 hours. Recent Labs    09/05/20 0441 09/05/20 1352 09/06/20 0818  WBC 37.5* 44.2* 40.7*  NEUTROABS 27.8* 35.8* 33.8*  HGB 7.1* 7.3* 8.2*  HCT 24.6* 26.0* 28.1*  MCV 86.9 89.3 88.1  PLT 442* 494* 433*   No results found for: TSH No results found for: HGBA1C No results found for: CHOL, HDL, LDLCALC, LDLDIRECT, TRIG, CHOLHDL  Significant Diagnostic Results in last 30 days:  DG Chest 1 View  Result Date: 09/02/2020 CLINICAL DATA:  Status post fall. EXAM: CHEST  1 VIEW COMPARISON:  June 08, 2015 FINDINGS: Cardiomediastinal silhouette is normal. Mediastinal contours appear intact. Mild calcific atherosclerotic disease of the aorta. There is no evidence of focal airspace consolidation, pleural effusion or pneumothorax. Osseous structures are without acute abnormality. Soft tissues are grossly normal. IMPRESSION: No active disease. Electronically Signed    By: Fidela Salisbury M.D.   On: 09/02/2020 19:53   DG Pelvis 1-2 Views  Result Date: 09/02/2020 CLINICAL DATA:  Fall EXAM: PELVIS - 1-2 VIEW COMPARISON:  None. FINDINGS: There is no evidence of pelvic fracture or diastasis. No pelvic bone lesions are seen. IMPRESSION: Negative. Electronically Signed   By: Rolm Baptise M.D.   On: 09/02/2020 19:55   DG Tibia/Fibula Left  Result Date: 09/02/2020 CLINICAL DATA:  Fall EXAM: LEFT TIBIA AND FIBULA - 2 VIEW COMPARISON:  None. FINDINGS: No acute bony abnormality. Specifically, no fracture, subluxation, or dislocation. No radiopaque foreign body. IMPRESSION: No acute bony abnormality. Electronically Signed   By: Rolm Baptise M.D.   On: 09/02/2020 19:57   CT Head Wo Contrast  Result Date: 09/02/2020 CLINICAL DATA:  Fall, hit head EXAM: CT HEAD WITHOUT CONTRAST TECHNIQUE: Contiguous axial images were obtained from the base of the skull through the vertex without intravenous contrast. COMPARISON:  None. FINDINGS: Brain: There is atrophy and chronic small vessel disease changes. No acute intracranial abnormality. Specifically, no hemorrhage, hydrocephalus, mass lesion, acute infarction, or significant intracranial injury. Vascular: No hyperdense vessel or unexpected calcification. Skull: No acute calvarial abnormality. Sinuses/Orbits: Visualized paranasal sinuses and mastoids clear. Orbital soft tissues unremarkable. Other: Soft tissue swelling posterior scalp. IMPRESSION: Atrophy, chronic microvascular disease. No acute intracranial abnormality. Electronically Signed   By: Rolm Baptise M.D.   On: 09/02/2020 20:28   CT Cervical Spine Wo Contrast  Result Date: 09/02/2020 CLINICAL DATA:  Fall, hit back of head. EXAM: CT CERVICAL SPINE WITHOUT CONTRAST TECHNIQUE: Multidetector CT imaging of the cervical spine was performed without intravenous contrast. Multiplanar CT image reconstructions were also generated. COMPARISON:  None. FINDINGS: Alignment: Slight  degenerative anterolisthesis of C2 on C3 and C7 on T1. Skull base and vertebrae: No acute fracture. No primary bone lesion or focal pathologic process. Soft tissues and spinal canal: No prevertebral fluid or swelling. No visible canal hematoma. Disc levels: Diffuse advanced degenerative disc disease. Mild bilateral degenerative facet disease. Upper  chest: No acute findings Other: Bilateral carotid artery calcifications. IMPRESSION: Degenerative disc and facet disease.  No acute bony abnormality. Electronically Signed   By: Rolm Baptise M.D.   On: 09/02/2020 20:28   DG Knee Right Port  Result Date: 09/03/2020 CLINICAL DATA:  [place].  Right knee pain. EXAM: PORTABLE RIGHT KNEE - 1-2 VIEW COMPARISON:  None. FINDINGS: Mild to moderate knee joint degenerative changes but no acute fracture, bone lesion or osteochondral abnormality. There is a moderate-sized suprapatellar knee joint effusion noted. IMPRESSION: 1. Mild to moderate knee joint degenerative changes and moderate-sized joint effusion. 2. No acute bony findings. Electronically Signed   By: Marijo Sanes M.D.   On: 09/03/2020 13:13   DG Tibia/Fibula Right Port  Result Date: 09/03/2020 CLINICAL DATA:  Tripped yesterday and injured leg. EXAM: PORTABLE RIGHT TIBIA AND FIBULA - 2 VIEW COMPARISON:  None. FINDINGS: The knee and ankle joints are grossly maintained. Moderate degenerative changes. No acute fracture of the tibia or fibula is identified. IMPRESSION: No acute bony findings. Electronically Signed   By: Marijo Sanes M.D.   On: 09/03/2020 13:12   DG HIP PORT UNILAT WITH PELVIS 1V RIGHT  Result Date: 09/03/2020 CLINICAL DATA:  [place] yesterday.  Right hip pain. EXAM: DG HIP (WITH OR WITHOUT PELVIS) 1V PORT RIGHT COMPARISON:  None. FINDINGS: Both hips are normally located. No acute hip fracture is identified. The pubic symphysis and SI joints are intact. No definite pelvic fractures. IMPRESSION: No acute bony findings. Electronically Signed   By: Marijo Sanes M.D.   On: 09/03/2020 13:14    Assessment/Plan Bilateral leg edema with Leaking Increase Lasix to 40 mg for 3 days and then 20 mg again With Potassium I Dont think Wrapping will help at this time as it is higher up. Will try just Protective dressing Repeat BMP in 1 week  Chronic myeloid leukemia ([place]) Continue Gleevec Will follow with Dr Jana Hakim Recurrent falls Working with therapy Anemia, unspecified type Due to [facility]  Atrial fibrillation, unspecified type ([place]) Off Anticoagulation due to Bleeding  On low dose of toprol Constipation, unspecified constipation type  on Docusate BPH On Flomax Right leg drop Brace Family/ staff Communication:   Labs/tests ordered:

## 2020-09-15 ENCOUNTER — Inpatient Hospital Stay: Payer: Medicare Other | Attending: Oncology | Admitting: Oncology

## 2020-09-15 DIAGNOSIS — D72829 Elevated white blood cell count, unspecified: Secondary | ICD-10-CM | POA: Insufficient documentation

## 2020-09-15 DIAGNOSIS — C9210 Chronic myeloid leukemia, BCR/ABL-positive, not having achieved remission: Secondary | ICD-10-CM | POA: Diagnosis not present

## 2020-09-15 NOTE — Progress Notes (Signed)
[place]  [phone] [phone]     ID: William Ibarra DOB: 08-Mar-1924  MR#: [id number]  XKG#:[id number]  Patient Care Team: Shon Baton, MD as PCP - General (Internal Medicine) Chauncey Cruel, MD OTHER MD:  I connected with William Ibarra on 09/15/20 at  2:30 PM EST by video enabled telemedicine visit and verified that I am speaking with the correct person using two identifiers.   I discussed the limitations, risks, security and privacy concerns of performing an evaluation and management service by telemedicine and the availability of in-person appointments. I also discussed with the patient that there may be a patient responsible charge related to this service. The patient expressed understanding and agreed to proceed.   Other persons participating in the visit and their role in the encounter: patient's daughter from [place]  Patient's location: [place] location: [place]: Leukocytosis, thrombocytosis, anemia  CURRENT TREATMENT: To start William Ibarra: William Ibarra was contacted today for follow up of his leukocytosis, thrombocytosis, and anemia.  His daughter William Ibarra was present as well  REVIEW OF SYSTEMS: William Ibarra has had a knee effusion which is being treated.  He is staying in bed and was in bed during her interview today.  He was alert and pleasant but did not participate much.   COVID 19 VACCINATION STATUS:    HISTORY OF CURRENT ILLNESS: From the original intake note:  Mr. William Ibarra fell at home and hit his head.  He is not aware of how he fell.  He tells me he has been falling despite the use of a walker.  He came to the emergency room and he was found to have in addition to a laceration to his head very abnormal labs, with a white cell count of 51.7, hemoglobin 9.0, and platelets 591,000.  Review of the peripheral blood film shows many immature white cells to the  level of the myelocytes, although I did not see any blasts.  This picture is most consistent with a chronic myeloid leukemia.  We were consulted for further evaluation and treatment.  The patient's subsequent history is as detailed below.   PAST MEDICAL HISTORY: Past Medical History:  Diagnosis Date  . Atrial fibrillation ([place])   . BPH (benign prostatic hypertrophy)   . Diabetes mellitus   . Diverticulosis of colon    from a prior colonoscopy  . History of blood transfusion ~ 10/2013; 10/24/2014   "related to blood thinners"  . Hydrocele   . Hydrocele   . Hypertension   . Hypotension 02/05/2012  . Impaired glucose tolerance   . Overactive bladder   . Psoriasis   . Rosacea   . Spermatocele     PAST SURGICAL HISTORY: Past Surgical History:  Procedure Laterality Date  . CATARACT EXTRACTION W/ INTRAOCULAR LENS  IMPLANT, BILATERAL Bilateral    "successful in left eye; redid right"  . HYDROCELE EXCISION / REPAIR    . MELANOMA EXCISION Right    eye  . TONSILLECTOMY      FAMILY HISTORY Family History  Problem Relation Age of Onset  . Bleeding Disorder Neg Hx   The patient's father died from a stroke.  The patient's mother died at age 79 from a blood disorder not otherwise specified.  The patient is an only child.  He is not aware of any cancer in the family other than as just noted   SOCIAL HISTORY:  William Ibarra used to  sell hardware.  He is widowed and lives by himself (son from [place].  His oldest daughter Venetia Night lives at [place] where she is a Scientist, research (life sciences).  Son Rolena Infante lives in [place] and is retired.  Daughter William Ibarra works part-time jobs in [place].  The patient has 2 grandchildren and one great grandchild.  He is a quicker.    ADVANCED DIRECTIVES: The patient's daughter William Ibarra is his healthcare power of attorney.  She can be reached at [phone].   HEALTH MAINTENANCE: Social History   Tobacco Use  . Smoking status: Former Smoker     Packs/day: 0.50    Years: 30.00    Pack years: 15.00    Types: Cigarettes    Quit date: 02/05/1992    Years since quitting: 28.6  . Smokeless tobacco: Never Used  Substance Use Topics  . Alcohol use: Yes    Alcohol/week: 7.0 standard drinks    Types: 7 Glasses of wine per week  . Drug use: No     Colonoscopy:  PSA:  Bone density:   No Known Allergies  Current Outpatient Medications  Medication Sig Dispense Refill  . acetaminophen (TYLENOL) 325 MG tablet Take 650 mg by mouth every 6 (six) hours as needed.    Marland Kitchen allopurinol (ZYLOPRIM) 300 MG tablet Take 0.5 tablets (150 mg total) by mouth daily. 30 tablet 1  . bacitracin ointment Apply topically 2 (two) times daily. 120 g 0  . docusate sodium (COLACE) 100 MG capsule Take 100 mg by mouth daily.    . furosemide (LASIX) 20 MG tablet Take 20 mg by mouth daily.    Marland Kitchen imatinib (GLEEVEC) 100 MG tablet Take 2 tablets (200 mg total) by mouth daily with breakfast. Take with meals and large glass of water.Caution:Chemotherapy 60 tablet 1  . metoprolol succinate (TOPROL-XL) 25 MG 24 hr tablet Take 0.5 tablets (12.5 mg total) by mouth daily. 30 tablet 2  . potassium chloride (KLOR-CON) 10 MEQ tablet Take 10 mEq by mouth daily.    . tamsulosin (FLOMAX) 0.4 MG CAPS capsule Take 1 capsule (0.4 mg total) by mouth at bedtime. 30 capsule 1  . tuberculin (TUBERSOL) 5 UNIT/0.1ML injection Inject 5 Units into the skin once. Every 14 days    . zinc oxide 20 % ointment Apply 1 application topically as needed for irritation.     No current facility-administered medications for this visit.    OBJECTIVE: Elderly white man interviewed in bed  There were no vitals filed for this visit.   There is no height or weight on file to calculate BMI.   Wt Readings from Last 3 Encounters:  09/14/20 191 lb 11.2 oz (87 kg)  09/07/20 195 lb 3.2 oz (88.5 kg)  09/02/20 195 lb (88.5 kg)      ECOG FS:2 - Symptomatic, <50% confined to bed  Telemedicine visit  09/15/2020  LAB RESULTS:  CMP     Component Value Date/Time   NA 135 09/06/2020 0818   K 3.7 09/06/2020 0818   CL 101 09/06/2020 0818   CO2 25 09/06/2020 0818   GLUCOSE 187 (H) 09/06/2020 0818   BUN 16 09/06/2020 0818   CREATININE 0.94 09/06/2020 0818   CALCIUM 8.1 (L) 09/06/2020 0818   PROT 6.1 10/24/2014 1535   ALBUMIN 3.2 (L) 10/24/2014 1535   AST 19 10/24/2014 1535   ALT 12 10/24/2014 1535   ALKPHOS 46 10/24/2014 1535   BILITOT 1.6 (H) 10/24/2014 1535   GFRNONAA >60 09/06/2020 0818  GFRAA 74 (L) 10/27/2014 0639    Lab Results  Component Value Date   WBC 40.7 (H) 09/06/2020   NEUTROABS 33.8 (H) 09/06/2020   HGB 8.2 (L) 09/06/2020   HCT 28.1 (L) 09/06/2020   MCV 88.1 09/06/2020   PLT 433 (H) 09/06/2020   No results found for: LABCA2  No components found for: TDHRCB638  No results for input(s): INR in the last 168 hours.  No results found for: LABCA2  No results found for: GTX646  No results found for: OEH212  No results found for: YQM250  No results found for: CA2729  No components found for: HGQUANT  No results found for: CEA1 / No results found for: CEA1   No results found for: AFPTUMOR  No results found for: CHROMOGRNA  No results found for: TOTALPROTELP, ALBUMINELP, A1GS, A2GS, BETS, BETA2SER, GAMS, MSPIKE, SPEI (this displays SPEP labs)  No results found for: KPAFRELGTCHN, LAMBDASER, KAPLAMBRATIO (kappa/lambda light chains)  No results found for: HGBA, HGBA2QUANT, HGBFQUANT, HGBSQUAN (Hemoglobinopathy evaluation)   Lab Results  Component Value Date   LDH 397 (H) 09/05/2020    Lab Results  Component Value Date   IRON 33 (L) 09/04/2020   TIBC 252 09/04/2020   IRONPCTSAT 13 (L) 09/04/2020   (Iron and TIBC)  Lab Results  Component Value Date   FERRITIN 192 09/04/2020    Urinalysis    Component Value Date/Time   COLORURINE [place] (A) 10/26/2014 [place] 10/26/2014 1727   LABSPEC 1.020 10/26/2014 1727    PHURINE 5.5 10/26/2014 1727   GLUCOSEU NEGATIVE 10/26/2014 1727   HGBUR LARGE (A) 10/26/2014 1727   BILIRUBINUR NEGATIVE 10/26/2014 1727   KETONESUR NEGATIVE 10/26/2014 1727   PROTEINUR NEGATIVE 10/26/2014 1727   UROBILINOGEN 1.0 10/26/2014 1727   NITRITE NEGATIVE 10/26/2014 1727   LEUKOCYTESUR SMALL (A) 10/26/2014 1727    STUDIES: DG Chest 1 View  Result Date: 09/02/2020 CLINICAL DATA:  Status post fall. EXAM: CHEST  1 VIEW COMPARISON:  June 08, 2015 FINDINGS: Cardiomediastinal silhouette is normal. Mediastinal contours appear intact. Mild calcific atherosclerotic disease of the aorta. There is no evidence of focal airspace consolidation, pleural effusion or pneumothorax. Osseous structures are without acute abnormality. Soft tissues are grossly normal. IMPRESSION: No active disease. Electronically Signed   By: Fidela Salisbury M.D.   On: 09/02/2020 19:53   DG Pelvis 1-2 Views  Result Date: 09/02/2020 CLINICAL DATA:  Fall EXAM: PELVIS - 1-2 VIEW COMPARISON:  None. FINDINGS: There is no evidence of pelvic fracture or diastasis. No pelvic bone lesions are seen. IMPRESSION: Negative. Electronically Signed   By: Rolm Baptise M.D.   On: 09/02/2020 19:55   DG Tibia/Fibula Left  Result Date: 09/02/2020 CLINICAL DATA:  Fall EXAM: LEFT TIBIA AND FIBULA - 2 VIEW COMPARISON:  None. FINDINGS: No acute bony abnormality. Specifically, no fracture, subluxation, or dislocation. No radiopaque foreign body. IMPRESSION: No acute bony abnormality. Electronically Signed   By: Rolm Baptise M.D.   On: 09/02/2020 19:57   CT Head Wo Contrast  Result Date: 09/02/2020 CLINICAL DATA:  Fall, hit head EXAM: CT HEAD WITHOUT CONTRAST TECHNIQUE: Contiguous axial images were obtained from the base of the skull through the vertex without intravenous contrast. COMPARISON:  None. FINDINGS: Brain: There is atrophy and chronic small vessel disease changes. No acute intracranial abnormality. Specifically, no hemorrhage,  hydrocephalus, mass lesion, acute infarction, or significant intracranial injury. Vascular: No hyperdense vessel or unexpected calcification. Skull: No acute calvarial abnormality. Sinuses/Orbits: Visualized paranasal sinuses  and mastoids clear. Orbital soft tissues unremarkable. Other: Soft tissue swelling posterior scalp. IMPRESSION: Atrophy, chronic microvascular disease. No acute intracranial abnormality. Electronically Signed   By: Rolm Baptise M.D.   On: 09/02/2020 20:28   CT Cervical Spine Wo Contrast  Result Date: 09/02/2020 CLINICAL DATA:  Fall, hit back of head. EXAM: CT CERVICAL SPINE WITHOUT CONTRAST TECHNIQUE: Multidetector CT imaging of the cervical spine was performed without intravenous contrast. Multiplanar CT image reconstructions were also generated. COMPARISON:  None. FINDINGS: Alignment: Slight degenerative anterolisthesis of C2 on C3 and C7 on T1. Skull base and vertebrae: No acute fracture. No primary bone lesion or focal pathologic process. Soft tissues and spinal canal: No prevertebral fluid or swelling. No visible canal hematoma. Disc levels: Diffuse advanced degenerative disc disease. Mild bilateral degenerative facet disease. Upper chest: No acute findings Other: Bilateral carotid artery calcifications. IMPRESSION: Degenerative disc and facet disease.  No acute bony abnormality. Electronically Signed   By: Rolm Baptise M.D.   On: 09/02/2020 20:28   DG Knee Right Port  Result Date: 09/03/2020 CLINICAL DATA:  [place].  Right knee pain. EXAM: PORTABLE RIGHT KNEE - 1-2 VIEW COMPARISON:  None. FINDINGS: Mild to moderate knee joint degenerative changes but no acute fracture, bone lesion or osteochondral abnormality. There is a moderate-sized suprapatellar knee joint effusion noted. IMPRESSION: 1. Mild to moderate knee joint degenerative changes and moderate-sized joint effusion. 2. No acute bony findings. Electronically Signed   By: Marijo Sanes M.D.   On: 09/03/2020 13:13   DG  Tibia/Fibula Right Port  Result Date: 09/03/2020 CLINICAL DATA:  Tripped yesterday and injured leg. EXAM: PORTABLE RIGHT TIBIA AND FIBULA - 2 VIEW COMPARISON:  None. FINDINGS: The knee and ankle joints are grossly maintained. Moderate degenerative changes. No acute fracture of the tibia or fibula is identified. IMPRESSION: No acute bony findings. Electronically Signed   By: Marijo Sanes M.D.   On: 09/03/2020 13:12   DG HIP PORT UNILAT WITH PELVIS 1V RIGHT  Result Date: 09/03/2020 CLINICAL DATA:  [place] yesterday.  Right hip pain. EXAM: DG HIP (WITH OR WITHOUT PELVIS) 1V PORT RIGHT COMPARISON:  None. FINDINGS: Both hips are normally located. No acute hip fracture is identified. The pubic symphysis and SI joints are intact. No definite pelvic fractures. IMPRESSION: No acute bony findings. Electronically Signed   By: Marijo Sanes M.D.   On: 09/03/2020 13:14   Surgical Pathology  CASE: [id number]  PATIENT: William Ibarra  Flow Pathology Report      Clinical history: evaluate for CML    DIAGNOSIS:   - No monoclonal B-cell, phenotypically aberrant T-cell, or distinct  blast population identified  - See comment   COMMENT:   Review of the peripheral blood raises the possibility of chronic myeloid  leukemia; correlation with BCR/ABL testing is recommended. There is no  evidence of acute leukemia or lymphoma.   GATING AND PHENOTYPIC ANALYSIS:   Gated population: Flow cytometric immunophenotyping is performed using  antibodies to the antigens listed in the table below. Electronic gates  are placed around a cell cluster displaying light scatter properties  corresponding to: lymphocytes and blasts   Abnormal Cells in gated population: N/A   Phenotype of Abnormal Cells: N/A               Lymphoid Antigens    Myeloid Antigens  Miscellaneous  CD2 tested  CD10 tested  CD11b   tested  CD45 tested  CD3 tested  CD19 tested  CD11c   ND  HLA-Dr   tested  CD4 tested  CD20 tested  CD13 tested  CD34 tested  CD5 tested  CD22 ND  CD14 tested  CD38 tested  CD7 tested  CD79b   ND  CD15 tested  CD138   ND  CD8 tested  CD103   ND  CD16 tested  TdT ND  CD25 ND  CD200   tested  CD33 tested  CD123   tested  TCRab   ND  sKappa  tested  CD64 tested  CD41 ND  TCRgd   tested  sLambda  tested  CD117   tested  CD61 ND  CD56 tested  cKappa  ND  MPO ND  CD71 ND  CD57 ND  cLambda  ND       CD235a  ND     GROSS DESCRIPTION:   One lavender top tube submitted from [employer] for leukemia  testing.     Final Diagnosis performed by Thressa Sheller, MD.  Electronically signed  09/04/2020    Notes  Ref Range & Units 12 d ago  b2a2 transcript % Comment   Comment:      <0.0032 %  b3a2 transcript % Comment   Comment:      <0.0032 %  E1A2 Transcript % Comment   Comment:      <0.0032 %  Interpretation (BCRAL):  Negative   Comment: (NOTE)  NEGATIVE for the BCR-ABL1 e1a2 (p190), e13a2 (b2a2, p210) and e14a2  (b3a2, p210) fusion transcripts. These results do not rule out the  presence of rare BCR-ABL1 transcripts not detected by this assay.   Director Review [facility]):  Comment   Comment: (NOTE)  Loni Muse, PhD, [facility]   Director, [place] for [place] and [place], [place] [address]   [phone]   Background:  Comment VC   Comment: (NOTE)  This assay can detect three different types of BCR-ABL1 fusion  transcripts associated with CML, ALL, and AML: e13a2 (previously  b2a2) and e14a2 (previously b3a2) (major breakpoint, p210), as  well as e1a2 (minor breakpoint, p190). The e13a2 and e14a2  transcript values are titrated to the current International Scale  (IS). The standardized baseline is 100% BCR-ABL1 (IS) and major  molecular response (MMR) is equivalent to 0.1% BCR-ABL1 (IS)    corresponding to a 3-log reduction. Results should be correlated with  appropriate clinical and laboratory information as indicated.        ASSESSMENT: 86 y.o. [place] resident admitted 09/02/2020 following a fall, found to have an elevated white cell count and platelet count and significant anemia, review of blood film most consistent with chronic myeloid leukemia  (1) BCR.ABL 09/04/2020 does not document a BCR.ABL mutation  (2) patient opted against bone marrow biopsy  (3) Gleevec started 09/03/2020 at half-dose (200 mg/d)   PLAN: Mr Hackenberg is tolerating Gleevec well and indeed he is on a very low dose.  He is also on allopurinol.  Unfortunately we were not able to document a BCR.ABL mutation from the material submitted.  After discussion with the patient today and his daughter William Ibarra we are going to continue the [place] as before and recheck a CBC next week.  If we are seeing evidence of response and is tolerating it well it will not be necessary to perform a bone marrow biopsy, otherwise that would be my recommendation.  Note that the patient and his daughter both have been very reluctant to undergo this procedure.  I will speak with them again virtually on 09/22/2020.   Chauncey Cruel, MD   09/15/2020 2:43 PM Medical Oncology and Hematology [facility] [place], [place] [address] Tel. [phone]    Fax. [phone]   I, Wilburn Mylar, am acting as scribe for Dr. Virgie Dad. Chad Donoghue.  I, Lurline Del MD, have reviewed the above documentation for accuracy and completeness, and I agree with the above.    *Total Encounter Time as defined by the Centers for Medicare and Medicaid Services includes, in addition to the face-to-face time of a patient visit (documented in the note above) non-face-to-face time: obtaining and reviewing outside history, ordering and reviewing medications, tests or procedures, care coordination  (communications with other health care professionals or caregivers) and documentation in the medical record.

## 2020-09-21 ENCOUNTER — Encounter: Payer: Self-pay | Admitting: Oncology

## 2020-09-21 DIAGNOSIS — C9210 Chronic myeloid leukemia, BCR/ABL-positive, not having achieved remission: Secondary | ICD-10-CM | POA: Diagnosis not present

## 2020-09-21 DIAGNOSIS — I10 Essential (primary) hypertension: Secondary | ICD-10-CM | POA: Diagnosis not present

## 2020-09-22 ENCOUNTER — Inpatient Hospital Stay (HOSPITAL_BASED_OUTPATIENT_CLINIC_OR_DEPARTMENT_OTHER): Payer: Medicare Other | Admitting: Oncology

## 2020-09-22 ENCOUNTER — Non-Acute Institutional Stay (SKILLED_NURSING_FACILITY): Payer: Medicare Other | Admitting: Nurse Practitioner

## 2020-09-22 ENCOUNTER — Encounter: Payer: Self-pay | Admitting: Nurse Practitioner

## 2020-09-22 DIAGNOSIS — R350 Frequency of micturition: Secondary | ICD-10-CM

## 2020-09-22 DIAGNOSIS — N400 Enlarged prostate without lower urinary tract symptoms: Secondary | ICD-10-CM | POA: Insufficient documentation

## 2020-09-22 DIAGNOSIS — M1A40X0 Other secondary chronic gout, unspecified site, without tophus (tophi): Secondary | ICD-10-CM

## 2020-09-22 DIAGNOSIS — M109 Gout, unspecified: Secondary | ICD-10-CM | POA: Insufficient documentation

## 2020-09-22 DIAGNOSIS — N401 Enlarged prostate with lower urinary tract symptoms: Secondary | ICD-10-CM

## 2020-09-22 DIAGNOSIS — S51011A Laceration without foreign body of right elbow, initial encounter: Secondary | ICD-10-CM

## 2020-09-22 DIAGNOSIS — K5901 Slow transit constipation: Secondary | ICD-10-CM

## 2020-09-22 DIAGNOSIS — C9210 Chronic myeloid leukemia, BCR/ABL-positive, not having achieved remission: Secondary | ICD-10-CM

## 2020-09-22 DIAGNOSIS — R600 Localized edema: Secondary | ICD-10-CM

## 2020-09-22 DIAGNOSIS — I4891 Unspecified atrial fibrillation: Secondary | ICD-10-CM

## 2020-09-22 DIAGNOSIS — W19XXXA Unspecified fall, initial encounter: Secondary | ICD-10-CM | POA: Diagnosis not present

## 2020-09-22 DIAGNOSIS — I872 Venous insufficiency (chronic) (peripheral): Secondary | ICD-10-CM | POA: Diagnosis not present

## 2020-09-22 NOTE — Progress Notes (Signed)
Location:    [facility] Room Number: 4 Place of Service:  SNF (31) Provider: Lennie Odor Wilgus Deyton NP  Shon Baton, MD  Patient Care Team: Shon Baton, MD as PCP - General (Internal Medicine)  Extended Emergency Contact Information Primary Emergency Contact: Cinda Quest Address: [address] [place], [place] [address] Johnnette Litter of [place] Phone: [phone] Relation: Daughter Secondary Emergency Contact: Jeffie Pollock [place] of [place] Phone: [phone] Mobile Phone: [phone] Relation: Daughter  Code Status:  DNR Goals of care: Advanced Directive information Advanced Directives 09/14/2020  Does Patient Have a Medical Advance Directive? Yes  Type of Advance Directive Living will;Healthcare Power of Attorney  Does patient want to make changes to medical advance directive? No - Patient declined  Copy of [place] in Chart? Yes - validated most recent copy scanned in chart (See row information)     Chief Complaint  Patient presents with  . Acute Visit    Fall    HPI:  Pt is a 86 y.o. male seen today for an acute visit for fall 09/21/20, skin tear right elbow, wbc 60.3, Hgb 6.2, plt 300, pending Dr. Jana Hakim video 4pm today. No s/s of bleeding of the right elbow.   BLE edema persisted, increased Furosemide 40mg  qd x3 days since 09/14/20 then down to 20mg  qd, BMP done  Chronic Myeloid Leukemia, takes Gleevec, Hgb 6.2 today 09/22/20, pending Dr. Jana Hakim visit vedio  Falls, again 09/21/20, resulted right elbow skin tear, closed with steri strips, no active bleeding.   Afib, Metoprolol  Constipation, takes Colace.   BPH, no urinary retention, takes Flomax.   Gout takes Allopurinol.        Past Medical History:  Diagnosis Date  . Atrial fibrillation ([place])   . BPH (benign prostatic hypertrophy)   . Diabetes mellitus   . Diverticulosis of colon    from a prior colonoscopy  . History  of blood transfusion ~ 10/2013; 10/24/2014   "related to blood thinners"  . Hydrocele   . Hydrocele   . Hypertension   . Hypotension 02/05/2012  . Impaired glucose tolerance   . Overactive bladder   . Psoriasis   . Rosacea   . Spermatocele    Past Surgical History:  Procedure Laterality Date  . CATARACT EXTRACTION W/ INTRAOCULAR LENS  IMPLANT, BILATERAL Bilateral    "successful in left eye; redid right"  . HYDROCELE EXCISION / REPAIR    . MELANOMA EXCISION Right    eye  . TONSILLECTOMY      No Known Allergies  Allergies as of 09/22/2020   No Known Allergies     Medication List       Accurate as of September 22, 2020 11:59 PM. If you have any questions, ask your nurse or doctor.        acetaminophen 325 MG tablet Commonly known as: TYLENOL Take 650 mg by mouth every 6 (six) hours as needed.   acetaminophen 325 MG tablet Commonly known as: TYLENOL Take 650 mg by mouth in the morning and at bedtime.   allopurinol 300 MG tablet Commonly known as: ZYLOPRIM Take 0.5 tablets (150 mg total) by mouth daily.   bacitracin ointment Apply topically 2 (two) times daily.   docusate sodium 100 MG capsule Commonly known as: COLACE Take 100 mg by mouth daily.   furosemide 20 MG tablet Commonly known as: LASIX Take 20 mg by mouth daily.  imatinib 100 MG tablet Commonly known as: GLEEVEC Take 2 tablets (200 mg total) by mouth daily with breakfast. Take with meals and large glass of water.Caution:Chemotherapy   metoprolol succinate 25 MG 24 hr tablet Commonly known as: TOPROL-XL Take 0.5 tablets (12.5 mg total) by mouth daily.   potassium chloride 10 MEQ tablet Commonly known as: KLOR-CON Take 10 mEq by mouth daily.   tamsulosin 0.4 MG Caps capsule Commonly known as: FLOMAX Take 1 capsule (0.4 mg total) by mouth at bedtime.   zinc oxide 20 % ointment Apply 1 application topically as needed for irritation.       Review of Systems  Constitutional: Positive for  activity change, appetite change and fatigue. Negative for chills, diaphoresis and fever.  HENT: Positive for hearing loss. Negative for congestion, trouble swallowing and voice change.   Eyes: Negative for visual disturbance.  Respiratory: Negative for cough, chest tightness, shortness of breath and wheezing.   Gastrointestinal: Negative for abdominal distention, abdominal pain, constipation, nausea and vomiting.  Genitourinary: Positive for frequency. Negative for difficulty urinating, dysuria, hematuria and urgency.  Musculoskeletal: Positive for gait problem.  Skin: Positive for pallor and wound.  Neurological: Negative for speech difficulty, weakness, light-headedness and headaches.       Memory lapses.   Psychiatric/Behavioral: Negative for agitation, behavioral problems and hallucinations. The patient is not nervous/anxious.        Appears tired.     Immunization History  Administered Date(s) Administered  . Moderna SARS-COVID-2 Vaccination 11/08/2019, 12/06/2019  . Pneumococcal-Unspecified 09/08/2020  . Tdap 09/11/2015   Pertinent  Health Maintenance Due  Topic Date Due  . HEMOGLOBIN A1C  Never done  . FOOT EXAM  Never done  . OPHTHALMOLOGY EXAM  Never done  . URINE MICROALBUMIN  Never done  . INFLUENZA VACCINE  06/04/2020  . PNA vac Low Risk Adult (2 of 2 - PCV13) 09/08/2021   No flowsheet data found. Functional Status Survey:    Vitals:   09/22/20 1506  BP: 134/60  Pulse: 68  Resp: 20  Temp: 97.8 F (36.6 C)  SpO2: 94%  Weight: 190 lb (86.2 kg)  Height: 6' (1.829 m)   Body mass index is 25.77 kg/m. Physical Exam Vitals and nursing note reviewed.  Constitutional:      General: He is not in acute distress.    Comments: Appears tired, eyes closed, but opened eye when spoke to   HENT:     Head: Normocephalic and atraumatic.     Nose: Nose normal.     Mouth/Throat:     Mouth: Mucous membranes are dry.  Eyes:     Extraocular Movements: Extraocular  movements intact.     Conjunctiva/sclera: Conjunctivae normal.     Pupils: Pupils are equal, round, and reactive to light.  Cardiovascular:     Rate and Rhythm: Normal rate. Rhythm irregular.     Heart sounds: No murmur heard.   Pulmonary:     Effort: Pulmonary effort is normal.     Breath sounds: Rhonchi present. No wheezing or rales.  Abdominal:     General: Bowel sounds are normal.     Palpations: Abdomen is soft.     Tenderness: There is no abdominal tenderness. There is no guarding or rebound.  Musculoskeletal:     Cervical back: Normal range of motion and neck supple.     Right lower leg: Edema present.     Left lower leg: Edema present.     Comments: 1-2 + edema  BLE, left elbow slight edema due to the most dependent position at time of my examination.   Skin:    General: Skin is warm and dry.     Findings: Bruising present.     Comments: Skin tear @ R elbow is closed with steri strips, no s/s of bleeding or infection. Multiple ecchymoses BUE  Neurological:     General: No focal deficit present.     Mental Status: He is alert. Mental status is at baseline.     Motor: No weakness.     Coordination: Coordination normal.     Gait: Gait abnormal.     Comments: Person, place  Psychiatric:        Mood and Affect: Mood normal.        Behavior: Behavior normal.     Labs reviewed: Recent Labs    09/04/20 0611 09/04/20 0611 09/05/20 0441 09/05/20 0441 09/06/20 0818 09/11/20 0000 09/14/20 0000  NA 138   < > 137   < > 135 135* 136*  K 4.2   < > 4.1   < > 3.7 4.4 4.7  CL 104   < > 102   < > 101 101 98*  CO2 25   < > 26   < > 25 29* 24*  GLUCOSE 115*  --  127*  --  187*  --   --   BUN 18   < > 19   < > 16 22* 27*  CREATININE 0.93   < > 1.01   < > 0.94 0.9 0.8  CALCIUM 8.3*   < > 8.4*   < > 8.1* 7.8* 7.9*   < > = values in this interval not displayed.   Recent Labs    09/14/20 0000  AST 9*  ALT 5*  ALKPHOS 106  ALBUMIN 2.7*   Recent Labs    09/05/20 0441  09/05/20 0441 09/05/20 1352 09/05/20 1352 09/06/20 0818 09/11/20 0000 09/14/20 0000  WBC 37.5*   < > 44.2*   < > 40.7* 42.9 32.6  NEUTROABS 27.8*  --  35.8*  --  33.8*  --   --   HGB 7.1*   < > 7.3*   < > 8.2* 7.4* 7.3*  HCT 24.6*   < > 26.0*   < > 28.1* 25* 24*  MCV 86.9  --  89.3  --  88.1  --   --   PLT 442*   < > 494*   < > 433* 484* 386   < > = values in this interval not displayed.   No results found for: TSH No results found for: HGBA1C No results found for: CHOL, HDL, LDLCALC, LDLDIRECT, TRIG, CHOLHDL  Significant Diagnostic Results in last 30 days:  DG Chest 1 View  Result Date: 09/02/2020 CLINICAL DATA:  Status post fall. EXAM: CHEST  1 VIEW COMPARISON:  June 08, 2015 FINDINGS: Cardiomediastinal silhouette is normal. Mediastinal contours appear intact. Mild calcific atherosclerotic disease of the aorta. There is no evidence of focal airspace consolidation, pleural effusion or pneumothorax. Osseous structures are without acute abnormality. Soft tissues are grossly normal. IMPRESSION: No active disease. Electronically Signed   By: Fidela Salisbury M.D.   On: 09/02/2020 19:53   DG Pelvis 1-2 Views  Result Date: 09/02/2020 CLINICAL DATA:  Fall EXAM: PELVIS - 1-2 VIEW COMPARISON:  None. FINDINGS: There is no evidence of pelvic fracture or diastasis. No pelvic bone lesions are seen. IMPRESSION: Negative. Electronically Signed   By: Lennette Bihari  [place] M.D.   On: 09/02/2020 19:55   DG Tibia/Fibula Left  Result Date: 09/02/2020 CLINICAL DATA:  Fall EXAM: LEFT TIBIA AND FIBULA - 2 VIEW COMPARISON:  None. FINDINGS: No acute bony abnormality. Specifically, no fracture, subluxation, or dislocation. No radiopaque foreign body. IMPRESSION: No acute bony abnormality. Electronically Signed   By: Rolm Baptise M.D.   On: 09/02/2020 19:57   CT Head Wo Contrast  Result Date: 09/02/2020 CLINICAL DATA:  Fall, hit head EXAM: CT HEAD WITHOUT CONTRAST TECHNIQUE: Contiguous axial images were  obtained from the base of the skull through the vertex without intravenous contrast. COMPARISON:  None. FINDINGS: Brain: There is atrophy and chronic small vessel disease changes. No acute intracranial abnormality. Specifically, no hemorrhage, hydrocephalus, mass lesion, acute infarction, or significant intracranial injury. Vascular: No hyperdense vessel or unexpected calcification. Skull: No acute calvarial abnormality. Sinuses/Orbits: Visualized paranasal sinuses and mastoids clear. Orbital soft tissues unremarkable. Other: Soft tissue swelling posterior scalp. IMPRESSION: Atrophy, chronic microvascular disease. No acute intracranial abnormality. Electronically Signed   By: Rolm Baptise M.D.   On: 09/02/2020 20:28   CT Cervical Spine Wo Contrast  Result Date: 09/02/2020 CLINICAL DATA:  Fall, hit back of head. EXAM: CT CERVICAL SPINE WITHOUT CONTRAST TECHNIQUE: Multidetector CT imaging of the cervical spine was performed without intravenous contrast. Multiplanar CT image reconstructions were also generated. COMPARISON:  None. FINDINGS: Alignment: Slight degenerative anterolisthesis of C2 on C3 and C7 on T1. Skull base and vertebrae: No acute fracture. No primary bone lesion or focal pathologic process. Soft tissues and spinal canal: No prevertebral fluid or swelling. No visible canal hematoma. Disc levels: Diffuse advanced degenerative disc disease. Mild bilateral degenerative facet disease. Upper chest: No acute findings Other: Bilateral carotid artery calcifications. IMPRESSION: Degenerative disc and facet disease.  No acute bony abnormality. Electronically Signed   By: Rolm Baptise M.D.   On: 09/02/2020 20:28   DG Knee Right Port  Result Date: 09/03/2020 CLINICAL DATA:  [place].  Right knee pain. EXAM: PORTABLE RIGHT KNEE - 1-2 VIEW COMPARISON:  None. FINDINGS: Mild to moderate knee joint degenerative changes but no acute fracture, bone lesion or osteochondral abnormality. There is a moderate-sized  suprapatellar knee joint effusion noted. IMPRESSION: 1. Mild to moderate knee joint degenerative changes and moderate-sized joint effusion. 2. No acute bony findings. Electronically Signed   By: Marijo Sanes M.D.   On: 09/03/2020 13:13   DG Tibia/Fibula Right Port  Result Date: 09/03/2020 CLINICAL DATA:  Tripped yesterday and injured leg. EXAM: PORTABLE RIGHT TIBIA AND FIBULA - 2 VIEW COMPARISON:  None. FINDINGS: The knee and ankle joints are grossly maintained. Moderate degenerative changes. No acute fracture of the tibia or fibula is identified. IMPRESSION: No acute bony findings. Electronically Signed   By: Marijo Sanes M.D.   On: 09/03/2020 13:12   DG HIP PORT UNILAT WITH PELVIS 1V RIGHT  Result Date: 09/03/2020 CLINICAL DATA:  [place] yesterday.  Right hip pain. EXAM: DG HIP (WITH OR WITHOUT PELVIS) 1V PORT RIGHT COMPARISON:  None. FINDINGS: Both hips are normally located. No acute hip fracture is identified. The pubic symphysis and SI joints are intact. No definite pelvic fractures. IMPRESSION: No acute bony findings. Electronically Signed   By: Marijo Sanes M.D.   On: 09/03/2020 13:14    Assessment/Plan Fall Recurrent falls, increased frailty, lack of safety awareness, continue supportive care in SN[facility] for safety.   Skin tear of right elbow without complication Sustained from the fall 09/21/20, steri strips closure is  intact, no active bleeding or s/s of infection.   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   Chronic myeloid leukemia ([facility]) Chronic Myeloid Leukemia, takes Gleevec, Hgb 6.2 today 09/22/20, pending Dr. Jana Hakim visit vedio   Atrial fibrillation ([place]) Heart rate is in control, continue Metoprolol.   Slow transit constipation Continue Colace.   BPH (benign prostatic hyperplasia) BPH, no urinary retention, takes Flomax.    Gout Due to malignancy, continue  Allopurinol.     Family/ staff Communication: plan of care reviewed with the patient and charge nurse.   Labs/tests ordered:  CBC done 09/21/20  Time spend 35 minutes.

## 2020-09-22 NOTE — Assessment & Plan Note (Signed)
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

## 2020-09-22 NOTE — Assessment & Plan Note (Signed)
Due to malignancy, continue Allopurinol.

## 2020-09-22 NOTE — Progress Notes (Signed)
[employer]  [phone] [phone]     ID: CAINE BARFIELD DOB: 1924/07/28  MR#: [id number]  QZR#:[id number]  Patient Care Team: Shon Baton, MD as PCP - General (Internal Medicine) Chauncey Cruel, MD OTHER MD:  I connected with Hilbert Corrigan on 09/22/20 at  4:00 PM EST by video enabled telemedicine visit and verified that I am speaking with the correct person using two identifiers.   I discussed the limitations, risks, security and privacy concerns of performing an evaluation and management service by telemedicine and the availability of in-person appointments. I also discussed with the patient that there may be a patient responsible charge related to this service. The patient expressed understanding and agreed to proceed.   Other persons participating in the visit and their role in the encounter: patient's daughter from [place]  Patient's location: [place] location: [place]: Leukocytosis, thrombocytosis, anemia  CURRENT TREATMENT: Gleevec   INTERVAL HISTORY: Ambrosio's daughter Eustaquio Maize was contacted today for follow up of her father's leukocytosis, thrombocytosis, and anemia.  We had requested repeat lab work which on 09/14/2020 showed a white cell count down to 32.6 with normal platelets at 386 and hemoglobin 7.3.  He continues on Gleevec with good tolerance.  However he has had some lower extremity swelling.  Gleevec can cause some fluid retention and might possibly be causing or contributing to this problem.   REVIEW OF SYSTEMS: Lucille according to his daughter has been more confused and weaker.  He remains bedbound.  He in addition to having lower extremity swelling is having low blood pressure.  When they give him more Lasix because of the fluid then his blood pressure drops and it becomes an issue.  She is at the point that comfort is the only concern and she just wants to make  sure her father does not suffer unnecessarily.  We discussed a possible hospice referral   COVID 19 VACCINATION STATUS:    HISTORY OF CURRENT ILLNESS: From the original intake note:  Mr. Tadros fell at home and hit his head.  He is not aware of how he fell.  He tells me he has been falling despite the use of a walker.  He came to the emergency room and he was found to have in addition to a laceration to his head very abnormal labs, with a white cell count of 51.7, hemoglobin 9.0, and platelets 591,000.  Review of the peripheral blood film shows many immature white cells to the level of the myelocytes, although I did not see any blasts.  This picture is most consistent with a chronic myeloid leukemia.  We were consulted for further evaluation and treatment.  The patient's subsequent history is as detailed below.   PAST MEDICAL HISTORY: Past Medical History:  Diagnosis Date  . Atrial fibrillation ([place])   . BPH (benign prostatic hypertrophy)   . Diabetes mellitus   . Diverticulosis of colon    from a prior colonoscopy  . History of blood transfusion ~ 10/2013; 10/24/2014   "related to blood thinners"  . Hydrocele   . Hydrocele   . Hypertension   . Hypotension 02/05/2012  . Impaired glucose tolerance   . Overactive bladder   . Psoriasis   . Rosacea   . Spermatocele     PAST SURGICAL HISTORY: Past Surgical History:  Procedure Laterality Date  . CATARACT EXTRACTION W/ INTRAOCULAR LENS  IMPLANT, BILATERAL Bilateral    "successful  in left eye; redid right"  . HYDROCELE EXCISION / REPAIR    . MELANOMA EXCISION Right    eye  . TONSILLECTOMY      FAMILY HISTORY Family History  Problem Relation Age of Onset  . Bleeding Disorder Neg Hx   The patient's father died from a stroke.  The patient's mother died at age 53 from a blood disorder not otherwise specified.  The patient is an only child.  He is not aware of any cancer in the family other than as just noted   SOCIAL  HISTORY:  Praise used to sell hardware.  He is widowed and lives by himself (son from [place].  His oldest daughter Venetia Night lives at [place] where she is a Scientist, research (life sciences).  Son Rolena Infante lives in [place] and is retired.  Daughter Barnie Del works part-time jobs in [place].  The patient has 2 grandchildren and one great grandchild.  He is a quicker.    ADVANCED DIRECTIVES: The patient's daughter Barnie Del is his healthcare power of attorney.  She can be reached at [phone].   HEALTH MAINTENANCE: Social History   Tobacco Use  . Smoking status: Former Smoker    Packs/day: 0.50    Years: 30.00    Pack years: 15.00    Types: Cigarettes    Quit date: 02/05/1992    Years since quitting: 28.6  . Smokeless tobacco: Never Used  Substance Use Topics  . Alcohol use: Yes    Alcohol/week: 7.0 standard drinks    Types: 7 Glasses of wine per week  . Drug use: No     Colonoscopy:  PSA:  Bone density:   No Known Allergies  Current Outpatient Medications  Medication Sig Dispense Refill  . acetaminophen (TYLENOL) 325 MG tablet Take 650 mg by mouth every 6 (six) hours as needed.    Marland Kitchen acetaminophen (TYLENOL) 325 MG tablet Take 650 mg by mouth in the morning and at bedtime.    Marland Kitchen allopurinol (ZYLOPRIM) 300 MG tablet Take 0.5 tablets (150 mg total) by mouth daily. 30 tablet 1  . bacitracin ointment Apply topically 2 (two) times daily. 120 g 0  . docusate sodium (COLACE) 100 MG capsule Take 100 mg by mouth daily.    . furosemide (LASIX) 20 MG tablet Take 20 mg by mouth daily.    Marland Kitchen imatinib (GLEEVEC) 100 MG tablet Take 2 tablets (200 mg total) by mouth daily with breakfast. Take with meals and large glass of water.Caution:Chemotherapy 60 tablet 1  . metoprolol succinate (TOPROL-XL) 25 MG 24 hr tablet Take 0.5 tablets (12.5 mg total) by mouth daily. 30 tablet 2  . potassium chloride (KLOR-CON) 10 MEQ tablet Take 10 mEq by mouth daily.    . tamsulosin (FLOMAX) 0.4 MG CAPS capsule Take 1  capsule (0.4 mg total) by mouth at bedtime. 30 capsule 1  . zinc oxide 20 % ointment Apply 1 application topically as needed for irritation.     No current facility-administered medications for this visit.    OBJECTIVE:   There were no vitals filed for this visit.   There is no height or weight on file to calculate BMI.   Wt Readings from Last 3 Encounters:  09/22/20 190 lb (86.2 kg)  09/14/20 191 lb 11.2 oz (87 kg)  09/07/20 195 lb 3.2 oz (88.5 kg)      ECOG FS:4 - Bedbound  Telemedicine visit 09/22/2020  LAB RESULTS:  CMP     Component Value Date/Time   NA  136 (A) 09/14/2020 0000   K 4.7 09/14/2020 0000   CL 98 (A) 09/14/2020 0000   CO2 24 (A) 09/14/2020 0000   GLUCOSE 187 (H) 09/06/2020 0818   BUN 27 (A) 09/14/2020 0000   CREATININE 0.8 09/14/2020 0000   CREATININE 0.94 09/06/2020 0818   CALCIUM 7.9 (A) 09/14/2020 0000   PROT 6.1 10/24/2014 1535   ALBUMIN 2.7 (A) 09/14/2020 0000   AST 9 (A) 09/14/2020 0000   ALT 5 (A) 09/14/2020 0000   ALKPHOS 106 09/14/2020 0000   BILITOT 1.6 (H) 10/24/2014 1535   GFRNONAA >60 09/06/2020 0818   GFRAA 74 (L) 10/27/2014 0639    Lab Results  Component Value Date   WBC 32.6 09/14/2020   NEUTROABS 33.8 (H) 09/06/2020   HGB 7.3 (A) 09/14/2020   HCT 24 (A) 09/14/2020   MCV 88.1 09/06/2020   PLT 386 09/14/2020   No results found for: LABCA2  No components found for: [id number]  No results for input(s): INR in the last 168 hours.  No results found for: LABCA2  No results found for: HKV425  No results found for: ZDG387  No results found for: FIE332  No results found for: CA2729  No components found for: HGQUANT  No results found for: CEA1 / No results found for: CEA1   No results found for: AFPTUMOR  No results found for: CHROMOGRNA  No results found for: TOTALPROTELP, ALBUMINELP, A1GS, A2GS, BETS, BETA2SER, GAMS, MSPIKE, SPEI (this displays SPEP labs)  No results found for: KPAFRELGTCHN, LAMBDASER,  KAPLAMBRATIO (kappa/lambda light chains)  No results found for: HGBA, HGBA2QUANT, HGBFQUANT, HGBSQUAN (Hemoglobinopathy evaluation)   Lab Results  Component Value Date   LDH 397 (H) 09/05/2020    Lab Results  Component Value Date   IRON 33 (L) 09/04/2020   TIBC 252 09/04/2020   IRONPCTSAT 13 (L) 09/04/2020   (Iron and TIBC)  Lab Results  Component Value Date   FERRITIN 192 09/04/2020    Urinalysis    Component Value Date/Time   COLORURINE AMBER (A) 10/26/2014 [place] 10/26/2014 1727   LABSPEC 1.020 10/26/2014 1727   PHURINE 5.5 10/26/2014 1727   GLUCOSEU NEGATIVE 10/26/2014 1727   HGBUR LARGE (A) 10/26/2014 1727   BILIRUBINUR NEGATIVE 10/26/2014 1727   KETONESUR NEGATIVE 10/26/2014 1727   PROTEINUR NEGATIVE 10/26/2014 1727   UROBILINOGEN 1.0 10/26/2014 1727   NITRITE NEGATIVE 10/26/2014 1727   LEUKOCYTESUR SMALL (A) 10/26/2014 1727    STUDIES: DG Chest 1 View  Result Date: 09/02/2020 CLINICAL DATA:  Status post fall. EXAM: CHEST  1 VIEW COMPARISON:  June 08, 2015 FINDINGS: Cardiomediastinal silhouette is normal. Mediastinal contours appear intact. Mild calcific atherosclerotic disease of the aorta. There is no evidence of focal airspace consolidation, pleural effusion or pneumothorax. Osseous structures are without acute abnormality. Soft tissues are grossly normal. IMPRESSION: No active disease. Electronically Signed   By: Fidela Salisbury M.D.   On: 09/02/2020 19:53   DG Pelvis 1-2 Views  Result Date: 09/02/2020 CLINICAL DATA:  Fall EXAM: PELVIS - 1-2 VIEW COMPARISON:  None. FINDINGS: There is no evidence of pelvic fracture or diastasis. No pelvic bone lesions are seen. IMPRESSION: Negative. Electronically Signed   By: Rolm Baptise M.D.   On: 09/02/2020 19:55   DG Tibia/Fibula Left  Result Date: 09/02/2020 CLINICAL DATA:  Fall EXAM: LEFT TIBIA AND FIBULA - 2 VIEW COMPARISON:  None. FINDINGS: No acute bony abnormality. Specifically, no  fracture, subluxation, or dislocation. No radiopaque foreign body. IMPRESSION:  No acute bony abnormality. Electronically Signed   By: Rolm Baptise M.D.   On: 09/02/2020 19:57   CT Head Wo Contrast  Result Date: 09/02/2020 CLINICAL DATA:  Fall, hit head EXAM: CT HEAD WITHOUT CONTRAST TECHNIQUE: Contiguous axial images were obtained from the base of the skull through the vertex without intravenous contrast. COMPARISON:  None. FINDINGS: Brain: There is atrophy and chronic small vessel disease changes. No acute intracranial abnormality. Specifically, no hemorrhage, hydrocephalus, mass lesion, acute infarction, or significant intracranial injury. Vascular: No hyperdense vessel or unexpected calcification. Skull: No acute calvarial abnormality. Sinuses/Orbits: Visualized paranasal sinuses and mastoids clear. Orbital soft tissues unremarkable. Other: Soft tissue swelling posterior scalp. IMPRESSION: Atrophy, chronic microvascular disease. No acute intracranial abnormality. Electronically Signed   By: Rolm Baptise M.D.   On: 09/02/2020 20:28   CT Cervical Spine Wo Contrast  Result Date: 09/02/2020 CLINICAL DATA:  Fall, hit back of head. EXAM: CT CERVICAL SPINE WITHOUT CONTRAST TECHNIQUE: Multidetector CT imaging of the cervical spine was performed without intravenous contrast. Multiplanar CT image reconstructions were also generated. COMPARISON:  None. FINDINGS: Alignment: Slight degenerative anterolisthesis of C2 on C3 and C7 on T1. Skull base and vertebrae: No acute fracture. No primary bone lesion or focal pathologic process. Soft tissues and spinal canal: No prevertebral fluid or swelling. No visible canal hematoma. Disc levels: Diffuse advanced degenerative disc disease. Mild bilateral degenerative facet disease. Upper chest: No acute findings Other: Bilateral carotid artery calcifications. IMPRESSION: Degenerative disc and facet disease.  No acute bony abnormality. Electronically Signed   By: Rolm Baptise  M.D.   On: 09/02/2020 20:28   DG Knee Right Port  Result Date: 09/03/2020 CLINICAL DATA:  [place].  Right knee pain. EXAM: PORTABLE RIGHT KNEE - 1-2 VIEW COMPARISON:  None. FINDINGS: Mild to moderate knee joint degenerative changes but no acute fracture, bone lesion or osteochondral abnormality. There is a moderate-sized suprapatellar knee joint effusion noted. IMPRESSION: 1. Mild to moderate knee joint degenerative changes and moderate-sized joint effusion. 2. No acute bony findings. Electronically Signed   By: Marijo Sanes M.D.   On: 09/03/2020 13:13   DG Tibia/Fibula Right Port  Result Date: 09/03/2020 CLINICAL DATA:  Tripped yesterday and injured leg. EXAM: PORTABLE RIGHT TIBIA AND FIBULA - 2 VIEW COMPARISON:  None. FINDINGS: The knee and ankle joints are grossly maintained. Moderate degenerative changes. No acute fracture of the tibia or fibula is identified. IMPRESSION: No acute bony findings. Electronically Signed   By: Marijo Sanes M.D.   On: 09/03/2020 13:12   DG HIP PORT UNILAT WITH PELVIS 1V RIGHT  Result Date: 09/03/2020 CLINICAL DATA:  [place] yesterday.  Right hip pain. EXAM: DG HIP (WITH OR WITHOUT PELVIS) 1V PORT RIGHT COMPARISON:  None. FINDINGS: Both hips are normally located. No acute hip fracture is identified. The pubic symphysis and SI joints are intact. No definite pelvic fractures. IMPRESSION: No acute bony findings. Electronically Signed   By: Marijo Sanes M.D.   On: 09/03/2020 13:14    ASSESSMENT: 86 y.o. [place] resident admitted 09/02/2020 following a fall, found to have an elevated white cell count and platelet count and significant anemia, review of blood film most consistent with chronic myeloid leukemia  (1) BCR.ABL probe 09/04/2020 does not document a BCR.ABL mutation  (2) patient opted against bone marrow biopsy  (3) Gleevec started 09/03/2020 at half-dose (200 mg/d), discontinued 09/22/2020 without significant response, possibly contributing to the  patient's fluid retention   PLAN: I discussed the situation  with the patient's daughter.  She would like to transition to comfort care. At this point I think it is best to stop the [place].  I have sent note to Dr. Lyndel Safe asking her to stop that medication and alerting her to the daughters decision.  I offered to the family a hospice referral.  I do not think that is really needed at [facility] in terms of patient care but I think for family support they can be very helpful in a terminal situation.  At this point I have made myself available to both Dr. Lyndel Safe and the patient's family but have not scheduled any further labs or appointments.  Chauncey Cruel, MD   09/22/2020 4:29 PM Medical Oncology and Hematology [facility] [place], [place] [address] Tel. [phone]    Fax. [phone]   I, Wilburn Mylar, am acting as scribe for Dr. Virgie Dad. Amayrany Cafaro.  I, Lurline Del MD, have reviewed the above documentation for accuracy and completeness, and I agree with the above.   *Total Encounter Time as defined by the Centers for Medicare and Medicaid Services includes, in addition to the face-to-face time of a patient visit (documented in the note above) non-face-to-face time: obtaining and reviewing outside history, ordering and reviewing medications, tests or procedures, care coordination (communications with other health care professionals or caregivers) and documentation in the medical record.

## 2020-09-22 NOTE — Assessment & Plan Note (Signed)
Chronic Myeloid Leukemia, takes Gleevec, Hgb 6.2 today 09/22/20, pending Dr. Jana Hakim visit vedio

## 2020-09-22 NOTE — Assessment & Plan Note (Signed)
Heart rate is in control, continue Metoprolol  

## 2020-09-22 NOTE — Assessment & Plan Note (Signed)
Continue Colace.

## 2020-09-22 NOTE — Assessment & Plan Note (Signed)
BPH, no urinary retention, takes Flomax.

## 2020-09-22 NOTE — Assessment & Plan Note (Signed)
Sustained from the fall 09/21/20, steri strips closure is intact, no active bleeding or s/s of infection.

## 2020-09-22 NOTE — Assessment & Plan Note (Signed)
Recurrent falls, increased frailty, lack of safety awareness, continue supportive care in SNF [facility] for safety.

## 2020-09-23 ENCOUNTER — Other Ambulatory Visit: Payer: Self-pay | Admitting: Internal Medicine

## 2020-09-23 MED ORDER — LORAZEPAM 0.5 MG PO TABS: 0.5000 mg | ORAL_TABLET | ORAL | 0 refills | Status: AC | PRN

## 2020-09-23 MED ORDER — MORPHINE SULFATE (CONCENTRATE) 20 MG/ML PO SOLN: 5.0000 mg | ORAL | 0 refills | Status: AC | PRN

## 2020-09-23 NOTE — Progress Notes (Signed)
Nurse called Patient not doing well. Has cough. More Drowsy Wants to be referred to Hospice as recommended by Oncology Will start him on Roxanol and Ativan PRN Discontinue Gleevec

## 2020-09-24 ENCOUNTER — Encounter: Payer: Self-pay | Admitting: Oncology

## 2020-09-25 ENCOUNTER — Encounter: Payer: Self-pay | Admitting: Nurse Practitioner

## 2020-09-26 ENCOUNTER — Telehealth: Payer: Self-pay | Admitting: Oncology

## 2020-09-26 NOTE — Telephone Encounter (Signed)
No 11/19 los. No changes made to pt's schedule.

## 2020-09-29 ENCOUNTER — Encounter: Payer: Self-pay | Admitting: Oncology

## 2020-10-04 DEATH — deceased

## 2021-02-04 IMAGING — DX DG HIP (WITH OR WITHOUT PELVIS) 1V PORT*R*
3 series · 3 of 3 positions shown · non-contrast
Comparison: None.

CLINICAL DATA: Fell yesterday.  Right hip pain.

EXAM:
DG HIP (WITH OR WITHOUT PELVIS) 1V PORT RIGHT

[pelvis ap]
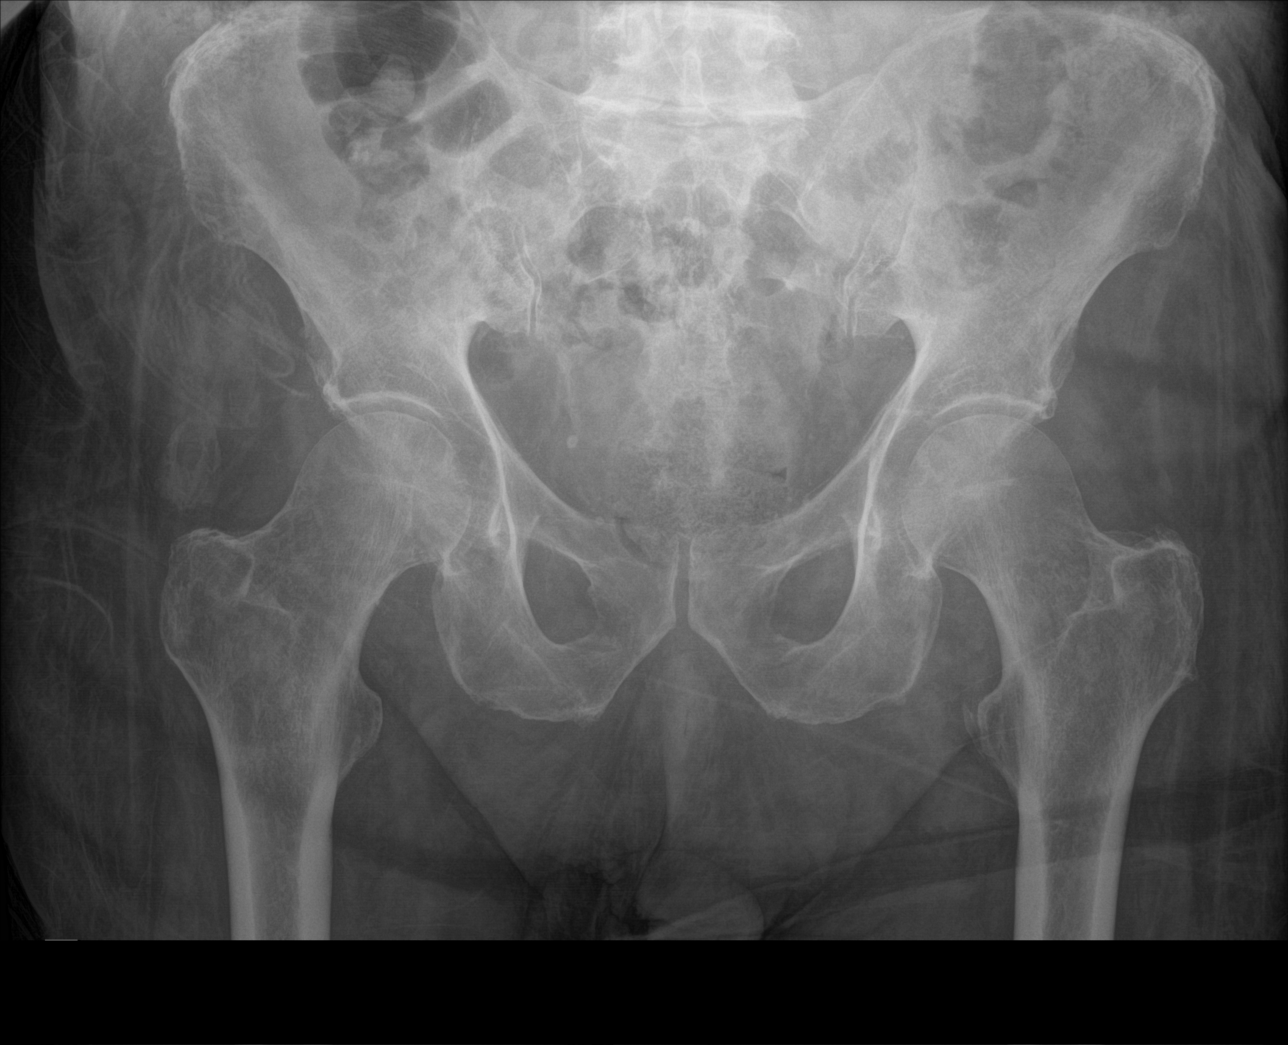

[hip ap]
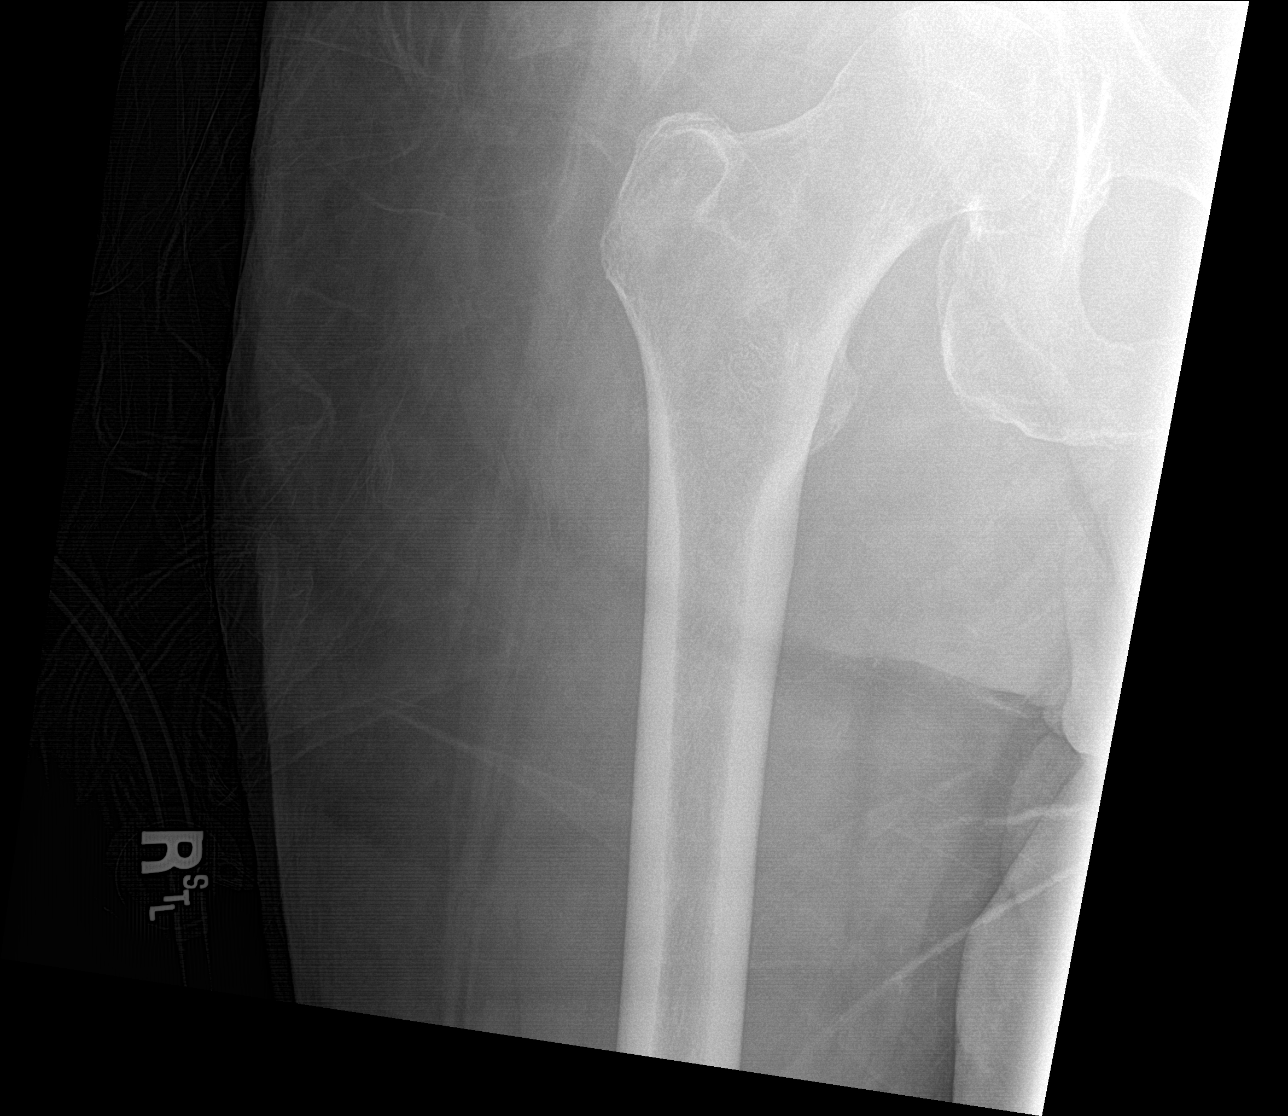

[hip frog leg]
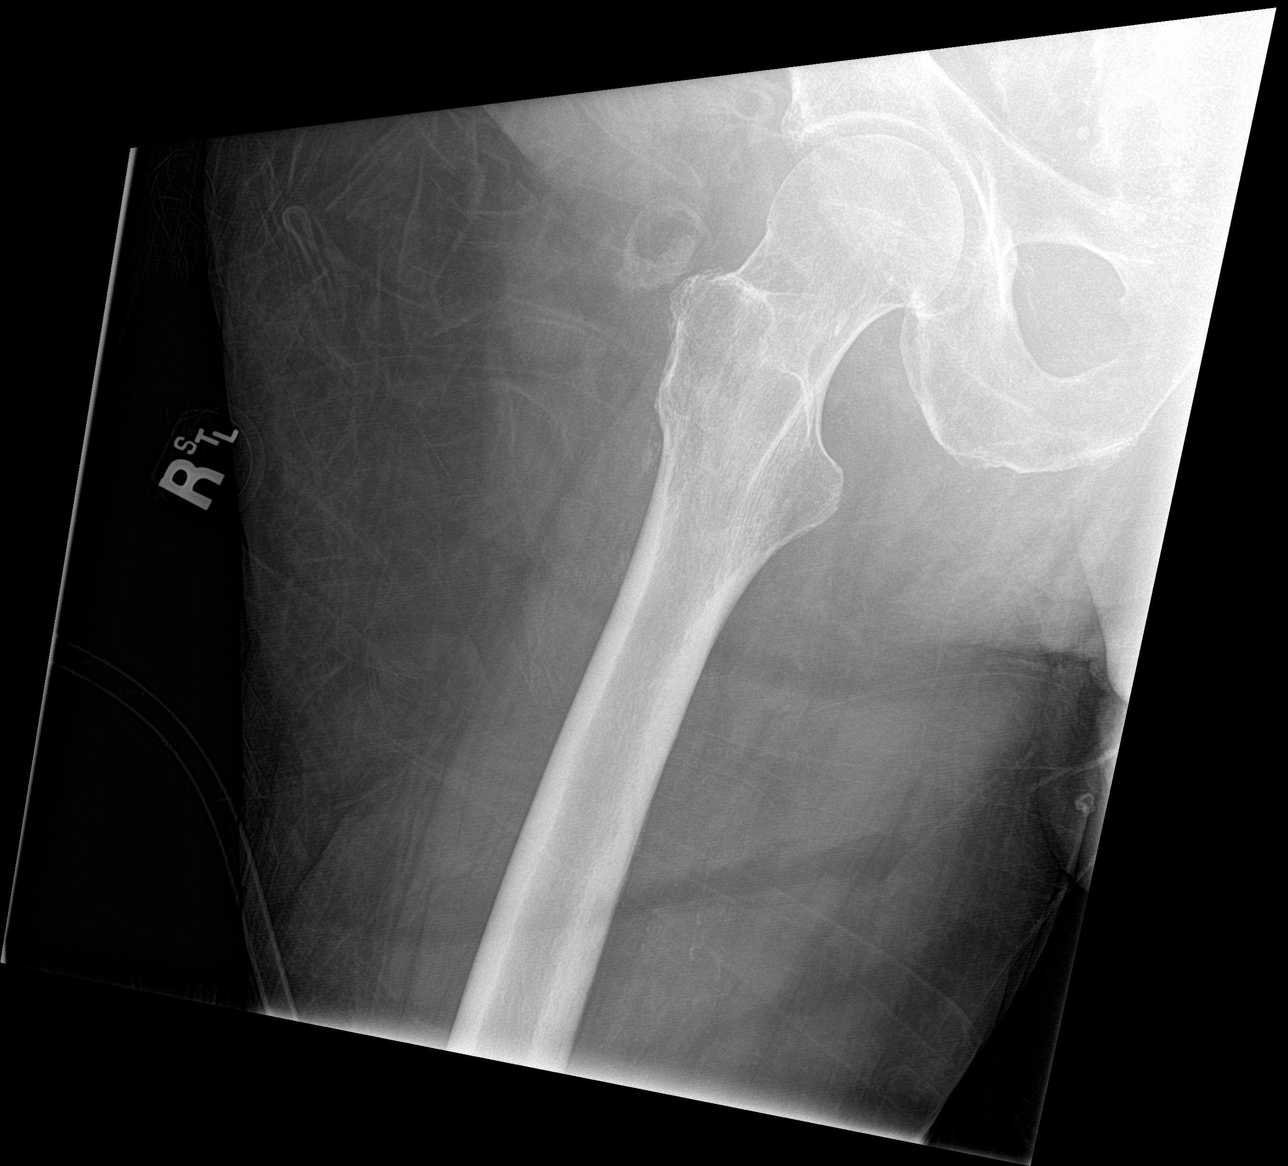

[3 of 3 positions shown; findings below may reference images not displayed]

FINDINGS: Both hips are normally located. No acute hip fracture is identified.
The pubic symphysis and SI joints are intact. No definite pelvic
fractures.
IMPRESSION: No acute bony findings.

## 2021-02-04 IMAGING — DX DG KNEE 1-2V PORT*R*
4 series · 4 of 4 positions shown · non-contrast
Comparison: None.

CLINICAL DATA: Fell.  Right knee pain.

EXAM:
PORTABLE RIGHT KNEE - 1-2 VIEW

[knee obl (1 of 2)]
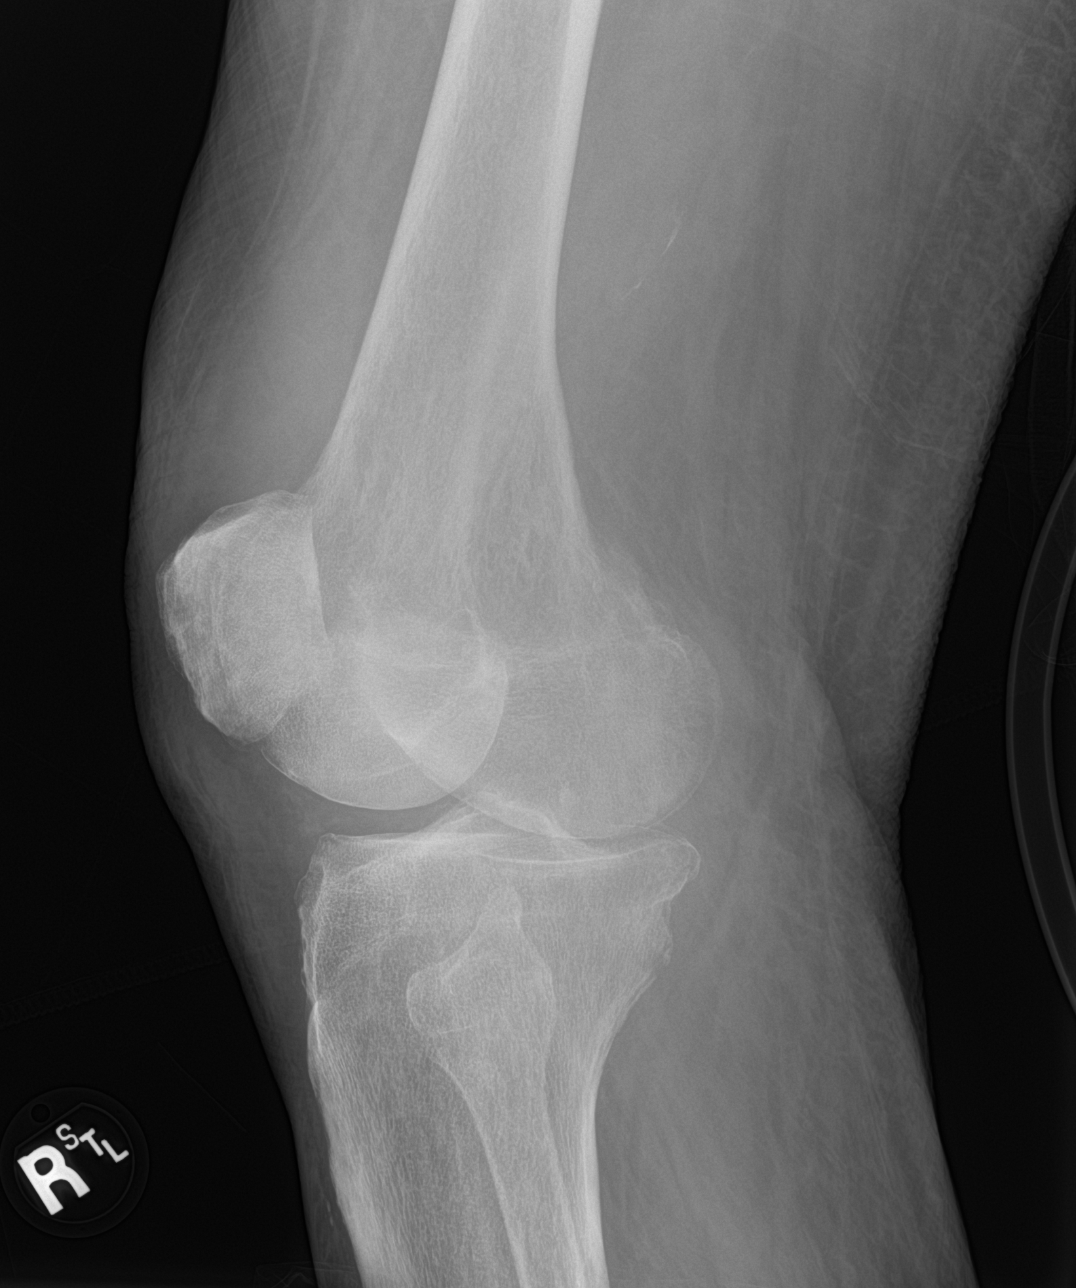

[knee obl (2 of 2)]
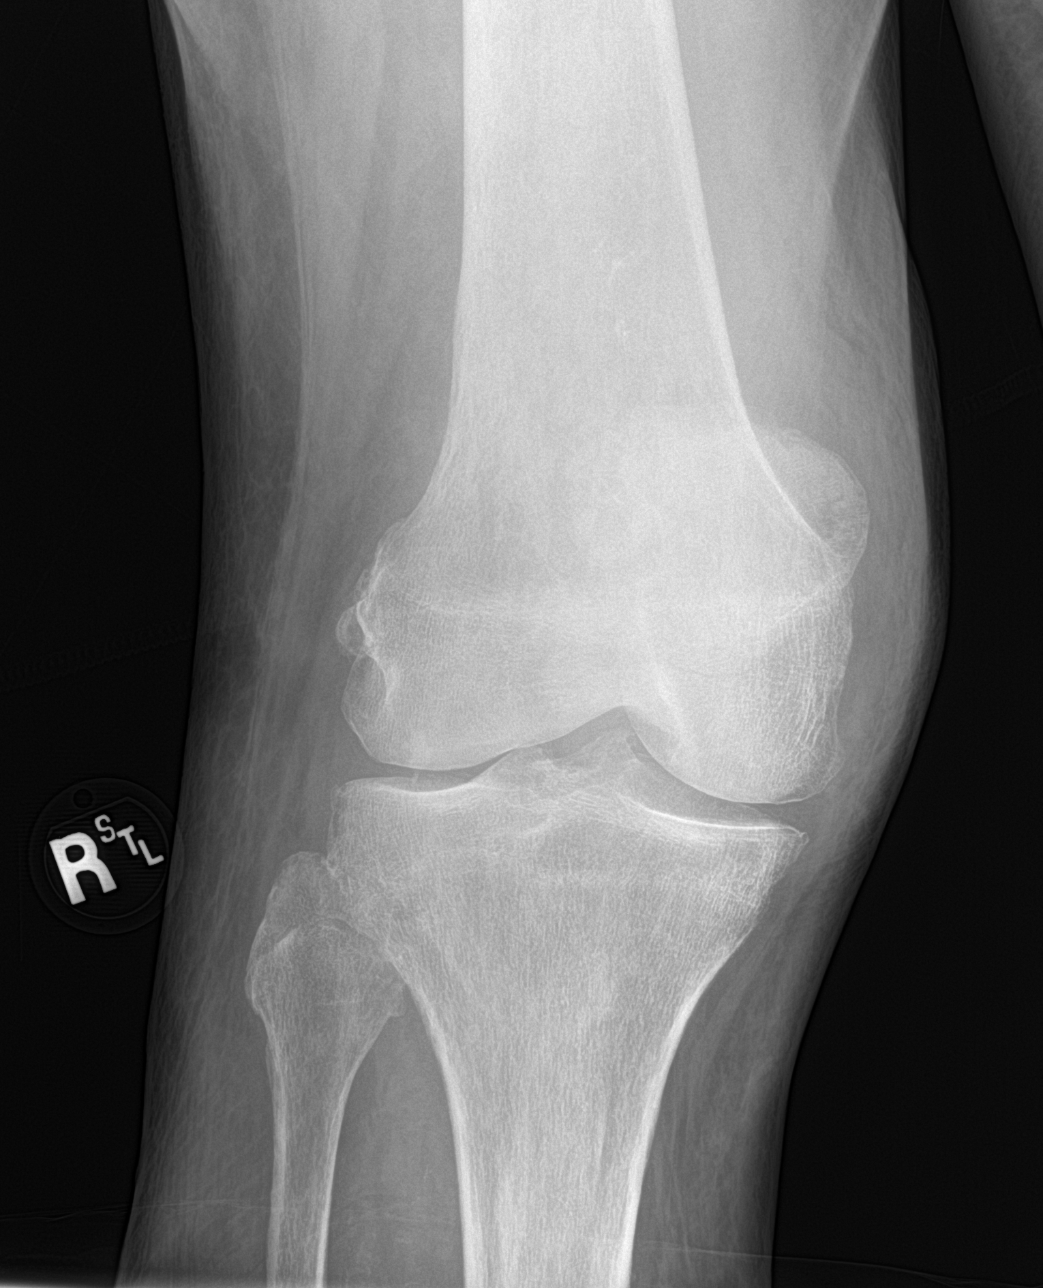

[knee lat]
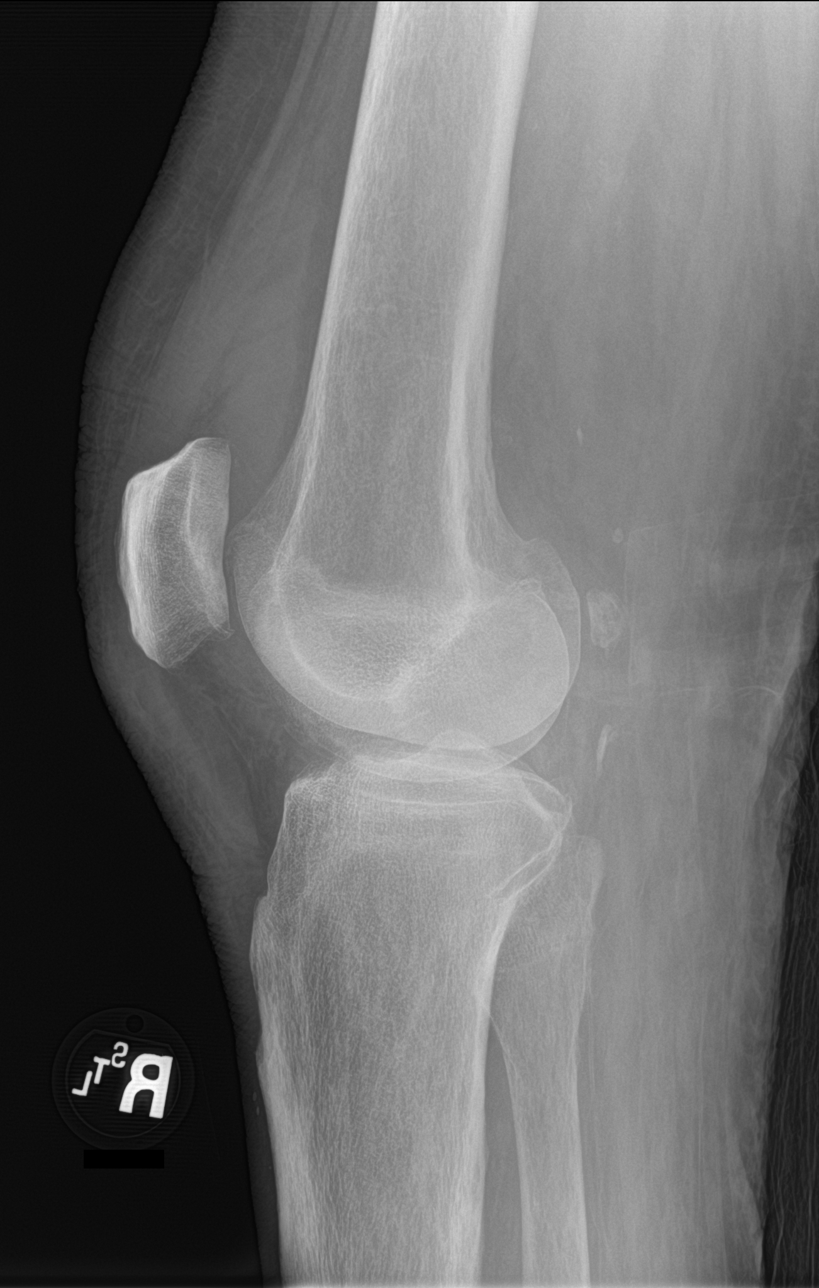

[knee ap]
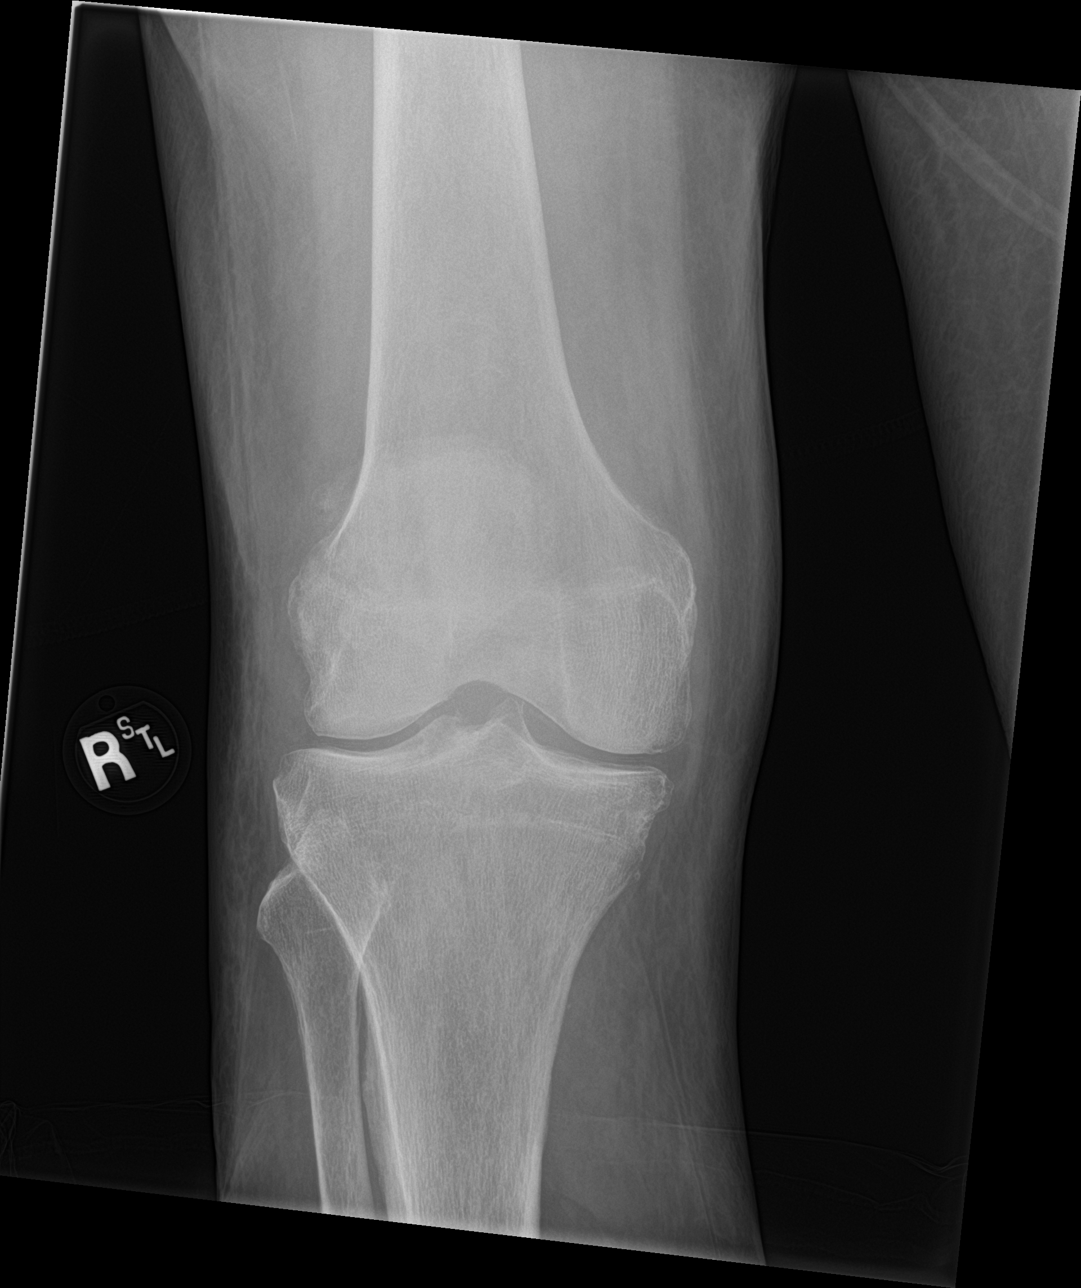

[4 of 4 positions shown; findings below may reference images not displayed]

FINDINGS: Mild to moderate knee joint degenerative changes but no acute
fracture, bone lesion or osteochondral abnormality. There is a
moderate-sized suprapatellar knee joint effusion noted.
IMPRESSION: 1. Mild to moderate knee joint degenerative changes and
moderate-sized joint effusion.
2. No acute bony findings.
